# Patient Record
Sex: Female | Born: 1950 | Race: Black or African American | Hispanic: No | Marital: Married | Smoking: Former smoker
Health system: Southern US, Community
[De-identification: ages and names within clinical notes are randomized; demographics above are authoritative.]

## PROBLEM LIST (undated history)

## (undated) DIAGNOSIS — E785 Hyperlipidemia, unspecified: Secondary | ICD-10-CM

## (undated) DIAGNOSIS — G4730 Sleep apnea, unspecified: Secondary | ICD-10-CM

## (undated) DIAGNOSIS — R9431 Abnormal electrocardiogram [ECG] [EKG]: Secondary | ICD-10-CM

## (undated) DIAGNOSIS — M1990 Unspecified osteoarthritis, unspecified site: Secondary | ICD-10-CM

## (undated) DIAGNOSIS — N289 Disorder of kidney and ureter, unspecified: Secondary | ICD-10-CM

## (undated) DIAGNOSIS — I10 Essential (primary) hypertension: Secondary | ICD-10-CM

## (undated) DIAGNOSIS — R7303 Prediabetes: Secondary | ICD-10-CM

## (undated) DIAGNOSIS — E119 Type 2 diabetes mellitus without complications: Secondary | ICD-10-CM

## (undated) HISTORY — PX: ABDOMINAL HYSTERECTOMY: SHX81

## (undated) HISTORY — PX: CHOLECYSTECTOMY: SHX55

## (undated) HISTORY — PX: TUBAL LIGATION: SHX77

## (undated) HISTORY — PX: COLONOSCOPY: SHX174

## (undated) HISTORY — PX: EYE SURGERY: SHX253

## (undated) HISTORY — PX: COLON SURGERY: SHX602

## (undated) HISTORY — DX: Prediabetes: R73.03

## (undated) HISTORY — DX: Unspecified osteoarthritis, unspecified site: M19.90

## (undated) HISTORY — DX: Abnormal electrocardiogram (ECG) (EKG): R94.31

## (undated) HISTORY — DX: Essential (primary) hypertension: I10

## (undated) HISTORY — DX: Hyperlipidemia, unspecified: E78.5

## (undated) HISTORY — DX: Sleep apnea, unspecified: G47.30

---

## 2000-04-11 ENCOUNTER — Other Ambulatory Visit: Admission: RE | Admit: 2000-04-11 | Discharge: 2000-04-11 | Payer: Self-pay | Admitting: Obstetrics and Gynecology

## 2001-05-23 ENCOUNTER — Ambulatory Visit (HOSPITAL_COMMUNITY): Admission: RE | Admit: 2001-05-23 | Discharge: 2001-05-23 | Payer: Self-pay | Admitting: Family Medicine

## 2001-05-23 ENCOUNTER — Encounter: Payer: Self-pay | Admitting: Family Medicine

## 2001-10-21 ENCOUNTER — Ambulatory Visit (HOSPITAL_COMMUNITY): Admission: RE | Admit: 2001-10-21 | Discharge: 2001-10-21 | Payer: Self-pay | Admitting: Family Medicine

## 2001-10-21 ENCOUNTER — Encounter: Payer: Self-pay | Admitting: Family Medicine

## 2001-11-15 ENCOUNTER — Ambulatory Visit (HOSPITAL_COMMUNITY): Admission: RE | Admit: 2001-11-15 | Discharge: 2001-11-15 | Payer: Self-pay | Admitting: Internal Medicine

## 2002-05-26 ENCOUNTER — Inpatient Hospital Stay (HOSPITAL_COMMUNITY): Admission: EM | Admit: 2002-05-26 | Discharge: 2002-05-28 | Payer: Self-pay | Admitting: *Deleted

## 2002-05-26 ENCOUNTER — Encounter: Payer: Self-pay | Admitting: Family Medicine

## 2002-05-30 ENCOUNTER — Ambulatory Visit (HOSPITAL_COMMUNITY): Admission: RE | Admit: 2002-05-30 | Discharge: 2002-05-30 | Payer: Self-pay | Admitting: Internal Medicine

## 2002-08-26 ENCOUNTER — Encounter: Payer: Self-pay | Admitting: *Deleted

## 2002-08-26 ENCOUNTER — Emergency Department (HOSPITAL_COMMUNITY): Admission: EM | Admit: 2002-08-26 | Discharge: 2002-08-26 | Payer: Self-pay | Admitting: *Deleted

## 2003-03-13 ENCOUNTER — Ambulatory Visit (HOSPITAL_COMMUNITY): Admission: RE | Admit: 2003-03-13 | Discharge: 2003-03-13 | Payer: Self-pay | Admitting: Obstetrics & Gynecology

## 2003-03-30 ENCOUNTER — Ambulatory Visit (HOSPITAL_COMMUNITY): Admission: RE | Admit: 2003-03-30 | Discharge: 2003-03-30 | Payer: Self-pay | Admitting: Obstetrics & Gynecology

## 2003-04-29 ENCOUNTER — Ambulatory Visit (HOSPITAL_COMMUNITY): Admission: RE | Admit: 2003-04-29 | Discharge: 2003-04-29 | Payer: Self-pay | Admitting: Family Medicine

## 2003-05-08 ENCOUNTER — Ambulatory Visit: Admission: RE | Admit: 2003-05-08 | Discharge: 2003-05-08 | Payer: Self-pay | Admitting: Family Medicine

## 2004-03-10 ENCOUNTER — Ambulatory Visit (HOSPITAL_COMMUNITY): Admission: RE | Admit: 2004-03-10 | Discharge: 2004-03-10 | Payer: Self-pay | Admitting: Family Medicine

## 2004-03-16 ENCOUNTER — Encounter (HOSPITAL_COMMUNITY): Admission: RE | Admit: 2004-03-16 | Discharge: 2004-03-17 | Payer: Self-pay | Admitting: Family Medicine

## 2004-08-02 ENCOUNTER — Ambulatory Visit (HOSPITAL_COMMUNITY): Admission: RE | Admit: 2004-08-02 | Discharge: 2004-08-02 | Payer: Self-pay | Admitting: Family Medicine

## 2005-07-12 ENCOUNTER — Ambulatory Visit (HOSPITAL_COMMUNITY): Admission: RE | Admit: 2005-07-12 | Discharge: 2005-07-12 | Payer: Self-pay | Admitting: Family Medicine

## 2005-09-14 ENCOUNTER — Ambulatory Visit (HOSPITAL_COMMUNITY): Admission: RE | Admit: 2005-09-14 | Discharge: 2005-09-14 | Payer: Self-pay | Admitting: Obstetrics & Gynecology

## 2009-01-06 ENCOUNTER — Ambulatory Visit (HOSPITAL_COMMUNITY): Admission: RE | Admit: 2009-01-06 | Discharge: 2009-01-06 | Payer: Self-pay | Admitting: Family Medicine

## 2009-01-07 ENCOUNTER — Ambulatory Visit (HOSPITAL_COMMUNITY): Admission: RE | Admit: 2009-01-07 | Discharge: 2009-01-07 | Payer: Self-pay | Admitting: Family Medicine

## 2009-01-14 ENCOUNTER — Ambulatory Visit (HOSPITAL_COMMUNITY): Admission: RE | Admit: 2009-01-14 | Discharge: 2009-01-14 | Payer: Self-pay | Admitting: Family Medicine

## 2009-07-19 ENCOUNTER — Encounter: Admission: RE | Admit: 2009-07-19 | Discharge: 2009-07-19 | Payer: Self-pay | Admitting: Family Medicine

## 2009-08-12 ENCOUNTER — Ambulatory Visit (HOSPITAL_COMMUNITY): Admission: RE | Admit: 2009-08-12 | Discharge: 2009-08-12 | Payer: Self-pay | Admitting: Family Medicine

## 2009-08-12 ENCOUNTER — Encounter: Payer: Self-pay | Admitting: Orthopedic Surgery

## 2009-08-16 ENCOUNTER — Ambulatory Visit (HOSPITAL_COMMUNITY): Admission: RE | Admit: 2009-08-16 | Discharge: 2009-08-16 | Payer: Self-pay | Admitting: Family Medicine

## 2009-08-16 ENCOUNTER — Encounter: Payer: Self-pay | Admitting: Orthopedic Surgery

## 2009-09-14 ENCOUNTER — Encounter: Payer: Self-pay | Admitting: Orthopedic Surgery

## 2009-09-15 ENCOUNTER — Ambulatory Visit: Payer: Self-pay | Admitting: Orthopedic Surgery

## 2009-09-15 DIAGNOSIS — M5412 Radiculopathy, cervical region: Secondary | ICD-10-CM | POA: Insufficient documentation

## 2010-01-07 ENCOUNTER — Ambulatory Visit (HOSPITAL_COMMUNITY): Admission: RE | Admit: 2010-01-07 | Discharge: 2010-01-07 | Payer: Self-pay | Admitting: Family Medicine

## 2010-03-31 NOTE — Assessment & Plan Note (Signed)
Summary: rt shoulder pain xr/mri at [facility]   Vital Signs:  Patient profile:   60 year old female Height:      60 inches Weight:      191 pounds Pulse rate:   82 / minute Resp:     16 per minute  Vitals Entered By: Fuller Canada MD (September 15, 2009 9:58 AM)  Visit Type:  new patient Referring Provider:  Dr. Lilyan Punt Primary Provider:  Dr. Lilyan Punt  CC:  right shoulder pain.  History of Present Illness: I have a 89-year-old female who had sudden onset of extreme RIGHT shoulder pain which radiated into her hand was associated with tingling and weakness in forward elevation.  This started in June is now gone.  Her range of motion is improved and her pain is zero.  Initially Vicodin and Percocet did not help.  She did get an injection in the front of the shoulder which seemed to help some.  She did have a shoulder x-ray which showed a type III acromion and a normal glenohumeral joint.  There were some height or trophic changes of the clavicle at the a.c. joint.  Cervical MRI dated June 20 of this year show a C5-C6 small central annular tear tiny focal disc protrusion slightly more prominent than her previous study without cord impingement    Xrays rt shoulder [facility] 08/12/09.  MRI C spine 08/16/09 for review also.  Meds: Pravastatin, Amlodipine.    Allergies (verified): No Known Drug Allergies  Past History:  Past Medical History: htn cholesterol  Past Surgical History: hysterectomy gallbladder  Family History: Family History of Diabetes Family History Coronary Heart Disease female < 13 Family History of Arthritis  Social History: Patient is married.  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  Review of Systems Constitutional:  Denies weight loss, weight gain, fever, chills, and fatigue. Cardiovascular:  Denies chest pain, palpitations, fainting, and murmurs. Respiratory:  Denies short of breath, wheezing, couch,  tightness, pain on inspiration, and snoring . Gastrointestinal:  Denies heartburn, nausea, vomiting, diarrhea, constipation, and blood in your stools. Genitourinary:  Denies frequency, urgency, difficulty urinating, painful urination, flank pain, and bleeding in urine. Neurologic:  Complains of tingling; denies numbness, unsteady gait, dizziness, tremors, and seizure. Musculoskeletal:  Denies joint pain, swelling, instability, stiffness, redness, heat, and muscle pain. Endocrine:  Denies excessive thirst, exessive urination, and heat or cold intolerance. Psychiatric:  Denies nervousness, depression, anxiety, and hallucinations. Skin:  Denies changes in the skin, poor healing, rash, itching, and redness. HEENT:  Denies blurred or double vision, eye pain, redness, and watering. Immunology:  Denies seasonal allergies, sinus problems, and allergic to bee stings. Hemoatologic:  Denies easy bleeding and brusing.  Physical Exam  Additional Exam:   * VS reviewed and were normal   *GEN: appearance was normal   ** CDV: normal pulses temperature and no edema  * LYMPH nodes were normal   * SKIN was normal   * Neuro: normal sensation, normal reflexes bilaterally ** Psyche: AAO x 3 and mood was normal   MSK *Gait was normal  *Inspection cervical spine was aligned properly was nontender had normal range of motion and a negative Spurling sign  Normal range of motion the shoulder negative Hawkins sign some tenderness over the trapezius muscle, normal strength in her RIGHT upper extremity and the shoulder was stable      Impression & Recommendations:  Problem # 1:  CERVICAL RADICULITIS (ICD-723.4) Assessment New  resultant cervical radiculitis tiny disc bulge C5-C6 no cord or root impingement now improved followup as needed  Orders: New Patient Level III (30865)  Patient Instructions: 1)  Please schedule a follow-up appointment as needed.

## 2010-03-31 NOTE — Letter (Signed)
Summary: *Orthopedic Consult Note  [facility]  [address] [place] [address]  [place], [place] [address]   Phone: [phone]  Fax: [phone]    Re:    ODESSER TOURANGEAU DOB:    24-Feb-1951   Dear: Lorin Picket   Thank you for requesting that we see the above patient for consultation.  A copy of the detailed office note will be sent under separate cover, for your review.  Evaluation today is consistent with:  1)  CERVICAL RADICULITIS (ICD-723.4)  Our recommendation is for: observation at this point as the patient is asymptomatic she did have a cervical disc lesion which is basically a nonsurgical protrusion and what sounds like some radiculitis at that time she was symptomatic       Thank you for this opportunity to look after your patient.  Sincerely,   Terrance Mass. MD.

## 2010-04-01 NOTE — Letter (Signed)
Summary: History form  History form   Imported By: Jacklynn Ganong 09/17/2009 07:33:52  _____________________________________________________________________  External Attachment:    Type:   Image     Comment:   External Document

## 2010-07-15 NOTE — Procedures (Signed)
   Emily, Waters                          ACCOUNT NO.:  [id number]   MEDICAL RECORD NO.:  [id number]                  PATIENT TYPE:  PINP   LOCATION:                                       FACILITY:  [facility]   PHYSICIAN:  Donna Bernard, M.D.             DATE OF BIRTH:  05/31/1950   DATE OF PROCEDURE:  DATE OF DISCHARGE:  05/28/2002                                EKG INTERPRETATION   INTERPRETATION:  EKG reveals normal sinus rhythm with nonspecific ST-T  changes.                                               Donna Bernard, M.D.    Karie Chimera  D:  07/28/2002  T:  07/28/2002  Job:  [id number]

## 2010-07-15 NOTE — H&P (Signed)
   NAMELAINI, URICK NO.:  [id number]   MEDICAL RECORD NO.:  [id number]                   PATIENT TYPE:  EMS   LOCATION:  ED                                   FACILITY:  [facility]   PHYSICIAN:  Scott A. Gerda Diss, M.D.               DATE OF BIRTH:  [birth year]   DATE OF ADMISSION:  DATE OF DISCHARGE:                                HISTORY & PHYSICAL   CHIEF COMPLAINT:  Chest discomfort.   HISTORY OF PRESENT ILLNESS:  This 60 year old black female states that she  had been doing fairly well, but late today she has had several episodes of  left-sided chest pain.  Once it was for just a couple of minutes, another  time it lasted 15-20 minutes and another time close to 50 minutes.  She  denies any sweats, nausea or vomiting.  She denies any regurgitation or  epigastric pain.  She denies any shoulder blade pain.  She denies fevers,  chills, or cough.  She denies shortness of breath but did relate that she  had a slight DOE with one episode of walking.  She denies any substernal  chest pressure or tightness with walking.   PAST MEDICAL HISTORY:  HTN, obesity, also her gallbladder and a  hysterectomy.   FAMILY MEDICAL HISTORY:  HTN, and mom had a heart attack at age 47, dad with  arthritis.   ALLERGIES:  None.   SOCIAL HISTORY:  Married, does not smoke; has 2 children.   MEDICATIONS:  Lotrel 5/10 one daily.   REVIEW OF SYSTEMS:  Per above.   PHYSICAL EXAMINATION:  GENERAL:  NAD.  HEENT:  TMs __________  .  NECK:  No masses.  CHEST:  CTA. Chest wall nontender.  HEART:  Regular, no murmurs.  No gallops.  ABDOMEN:  Abdomen is soft, no guarding or rebound.  EXTREMITIES:  No edema.  Pulses are normal.  SKIN:  Warm and dry.  VITAL SIGNS:  Blood pressure best reading was 146/94.   LABORATORY DATA:  EKG:  No acute changes noted.    ASSESSMENT AND PLAN:  Chest pain--rule out myocardial infarction.  To get  chest x-ray, lab work, consult cardiology. Given  the patient's risk factors,  feel patient is at risk of heart disease.  Stress Cardiolite versus  catheterization may be best route.                                               Scott A. Gerda Diss, M.D.    Linus Orn  D:  05/26/2002  T:  05/26/2002  Job:  [id number]

## 2010-07-15 NOTE — Op Note (Signed)
NAMESHALEAH, NISSLEY                          ACCOUNT NO.:  [id number]   MEDICAL RECORD NO.:  [id number]                   PATIENT TYPE:  AMB   LOCATION:  DAY                                  FACILITY:  [facility]   PHYSICIAN:  R. Roetta Sessions, M.D.              DATE OF BIRTH:  1950-07-14   DATE OF PROCEDURE:  05/30/2002  DATE OF DISCHARGE:                                 OPERATIVE REPORT   PROCEDURE:  Diagnostic colonoscopy.   INDICATIONS FOR PROCEDURE:  The patient is a 60 year old lady with  intermittent hematochezia.  I saw her back in 09/2001, and it was recommended  that she have a colonoscopy; however, she has put it off until now.  She was  recently discharged from the hospital where she was in briefly with chest  pain.  She said her cardiac workup was negative.  Colonoscopy is now being  done to further evaluate hematochezia.  This approach has been discussed  with the patient previously.  The potential risks, benefits, and  alternatives have been reviewed.  Please see my dictated consultation note  and my handwritten interim updated H&P.   PROCEDURE:  O2 saturation, blood pressure, pulses, and respirations were  monitored throughout the entire procedure.  Conscious sedation was with  Versed 6 mg IV, Demerol 125 mg IV in divided doses.  The instrument used was  the Olympus video chip adult colonoscope.   FINDINGS:  Digital rectal examination revealed no abnormalities.   ENDOSCOPIC FINDINGS:  The prep was good.   Rectum:  Examination of the rectal mucosa including retroflex view of the  anal verge revealed only internal hemorrhoids.   Colon:  The colonic mucosa was surveyed from the rectosigmoid junction to  the left, transverse, right colon to the area of the appendiceal orifice,  ileocecal valve, and cecum.  These structures were well-seen and  photographed for the record.  The colonic mucosa to the cecum appeared  normal.  From the level of the cecum and ileocecal  valve, the scope was  slowly withdrawn.  All previously mentioned mucosal surfaces were again  seen.  No other abnormalities were observed.  The patient tolerated the  procedure well and was reactive in endoscopy.   IMPRESSION:  1. Internal hemorrhoids, otherwise normal rectum.  2. Normal colon.   I suspect the patient is bleeding intermittently from hemorrhoids (and  incidentally, she does not report any rectal bleeding since 09/2001).   RECOMMENDATIONS:  1. Hemorrhoid literature provided to the patient.  2.     She is to use a course of Anusol HC suppositories one per rectum at bedtime.  3. Follow up with Dr. Lilyan Punt as needed.  4. Repeat colonoscopy in 10 years.  Jonathon Bellows, M.D.    RMR/MEDQ  D:  05/30/2002  T:  05/30/2002  Job:  [id number]   cc:   Lorin Picket A. Gerda Diss, M.D.  [address], [address]  [place]  [place] [address]  Fax: [phone]

## 2010-07-15 NOTE — Consult Note (Signed)
NAMEKANDA, DELUNA NO.:  [id number]   MEDICAL RECORD NO.:  [id number]                   PATIENT TYPE:  INP   LOCATION:  A201                                 FACILITY:  [facility]   PHYSICIAN:  Vida Roller, M.D.                DATE OF BIRTH:  08-16-1950   DATE OF CONSULTATION:  05/27/2002  DATE OF DISCHARGE:                                   CONSULTATION   HISTORY OF PRESENT ILLNESS:  The patient is a 60 year old African American  female with no significant cardiac history who has hypertension, presented  with an episode of chest pain to her primary care physician that she related  as pulsating left anterior chest pain with no associated shortness of breath  or diaphoresis.  She states that she was walking up a flight of stairs when  it occurred.  She says the pain essentially lasted about an hour, was self  limiting and essentially resolved, and she has not had another episode.   PAST MEDICAL HISTORY:  Significant for hypertension, obesity.  She is status  post a hysterectomy and cholecystectomy.   MEDICATIONS:  Her medications prior to admission were Lotrel 5 and 10.  In  the hospital, she is on Norvasc, Lisinopril, aspirin and nitroglycerin as  she needs it for pain.   SOCIAL HISTORY:  [place].  She is a  Scientist, physiological at a customer service center.  She is married and has two  children.  She has a five pack year smoking history but quit 15 years ago.  She does not use alcohol.  Does not use drugs.   FAMILY HISTORY:  Mother is alive and well at age 53 but had a myocardial  infarction at age 20 and has hypertension.  Her father is alive and he has  arthritis but no significant medical problems.  She has a sister with  atherosclerotic disease in her abdominal aorta and one sister and two  brothers who are alive and well without any significant disease.   REVIEW OF SYSTEMS:  Noncontributory aside from that discussed  in the history  of present illness.   PHYSICAL EXAMINATION:  GENERAL APPEARANCE:  She is a pleasant African  American female who is a good historian.  VITAL SIGNS:  She is afebrile, pulse 79, respiratory rate 20, blood pressure  146/80.  HEENT:  Unremarkable.  NECK:  Supple with no jugular venous distention or carotid bruits.  CHEST:  Clear to auscultation with a few scattered wheezes.  CARDIOVASCULAR:  Nondisplaced point of maximum impulse with no lifts or  thrills.  First and second heart sounds are normal.  There is no third or  fourth sound and no murmur.  ABDOMEN:  Obese but soft and nontender.  GU:  Deferred.  EXTREMITIES:  Lower extremities without cyanosis, clubbing or edema.  She  has no joint or muscle abnormalities.  Pulses are 2+ and equal throughout  without bruits.  NEUROLOGIC:  Grossly intact.   LABORATORY DATA:  Chest x-ray was not performed.  Electrocardiogram showed  sinus rhythm at a rate of 81 with a mild left axis deviation.  There is  nonspecific ST-T wave changes, but no Q-waves and no acute ischemic changes.  Her Q-T interval is mildly prolonged a 483 msec, not within a dangerous  range.   White blood cell count 7.4, H&H 13 and 39, platelets 296,000.  Sodium 139,  potassium 3.6, chloride 104, bicarbonate 36, BUN and creatinine 12 and 0.8  with blood sugar of 105.  Liver function tests are normal.  Three sets of  cardiac enzymes are inconsistent with acute myocardial infarction, negative  troponins.  PT/PTT and INR are 34, 13.3 and 1.  Urinalysis is normal.   ASSESSMENT:  Chest pain which is atypical for coronary disease but  concerning because it has an exertional component.  She does have several  risk factors, and it is reasonable to evaluate her as an inpatient.  I would  recommend an exercise Cardiolite.  Her hypertension seems to be not entirely  well controlled under medications. Considerations may be to increase her  Lisinopril.  We do not know what  her lipids are.  We have recommended a  fasting lipid panel in the morning.                                               Vida Roller, M.D.    JH/MEDQ  D:  05/27/2002  T:  05/28/2002  Job:  [id number]

## 2010-07-15 NOTE — Procedures (Signed)
[name], [name]              ACCOUNT NO.:  [id number]   MEDICAL RECORD NO.:  [id number]          PATIENT TYPE:  REC   LOCATION:  RAD                           FACILITY:  [facility]   PHYSICIAN:  Scott A. Gerda Diss, MD    DATE OF BIRTH:  [birth year]   DATE OF PROCEDURE:  03/16/2004  DATE OF DISCHARGE:                                    STRESS TEST   PROCEDURE:  Stress test Cardiolite.   INDICATION:  Chest discomfort, lightheadedness, high blood pressure.   RESTING EKG:  There are no acute ST segment changes noted on resting EKG.   HEART RATE RESPONSE EXERCISE:  The patient's heart rate went up to a target  heart rate within the first 3 minutes of exercise.  The patient was unable  to go into stage 2, had to manually override it.  The patient did have some  ST segment slight depression with upsloping in lead II.  There were no ST  segment changes indicative of heart disease.  In V3 and V4, she did have  some ST segment depressions that were noted at 4 minutes and again at 6  minutes.   The recovery phase of her EKG did have some non-ST segment depression, but  they are upsloping at 0.08 past J-point and were not significant.  In  addition to this, she had some slight ST segment depression of V4 and V5.  This rapidly corrected itself.   INTERPRETATION:  Mildly abnormal stress test with poor cardiac conditioning  and await Cardiolite studies.      SAL/MEDQ  D:  03/16/2004  T:  03/16/2004  Job:  [id number]

## 2010-07-15 NOTE — Op Note (Signed)
[name], [name]                          ACCOUNT NO.:  [id number]   MEDICAL RECORD NO.:  [id number]                   PATIENT TYPE:  AMB   LOCATION:  DAY                                  FACILITY:  [facility]   PHYSICIAN:  Gerrit Friends. Rourk, M.D.               DATE OF BIRTH:  1950-04-19   DATE OF PROCEDURE:  11/15/2001  DATE OF DISCHARGE:  11/15/2001                                 OPERATIVE REPORT   PROCEDURE:  Colonoscopy with snare polypectomy biopsy.   ENDOSCOPIST:  Gerrit Friends. Rourk, M.D.   INDICATIONS FOR PROCEDURE:  The patient is a 60 year old lady with an 63-  month history of intermittent rectal bleeding.  Colonoscopy is now being  done to further evaluate these symptoms.  The approach has been discussed  with the patient at length at the bedside and previously in my office.  The  potential risks, benefits, and alternatives have been reviewed; questions  answered.  She is agreeable.  Please see my dictated H&P for more  information.   DESCRIPTION OF PROCEDURE:  O2 saturation, blood pressure, pulse and  respirations were monitored throughout the entire procedure.   CONSCIOUS SEDATION:  Versed 7 mg IV, Demerol 125 mg IV in divided doses.   INSTRUMENT:  Olympus video chip adult colonoscope.   FINDINGS:  Digital rectal exam revealed no abnormalities.   ENDOSCOPIC FINDINGS:  The prep was good.   RECTUM:  Examination of the rectal mucosa including the retroflex view of  the anal verge revealed no abnormalities.   COLON:  The colonic mucosa was surveyed from the rectosigmoid junction  through the left transverse and right colon to the area of the appendiceal  orifice, ileocecal valve, and cecal valve and cecum.  These structures were  well seen and photographed for the record.  The patient was noted to have a  3 mm diminutive polyp at 15 cm and a large complex pedunculated polypoid  mass measuring approximately 3 x 5 cm between 30 and 35 cm in the sigmoid  colon.  No other  colonic mucosal abnormalities were noted.   From the level of the cecum and ileocecal valve the scope was slowly  withdrawn.  All previously mentioned mucosal surfaces were again seen.  No  abnormalities were observed.  The complex polyp in the sigmoid colon was  removed in piece meal fashion with snare cautery used without difficulty,  although it did take some time.  The diminutive polyp, at 15 cm, was cold  biopsied/removed.  The patient tolerated the procedure well and was reacted  in endoscopy.   IMPRESSION:  1. Normal rectum.  2. Complex pedunculated polyp at 30-35 cm removed in piecemeal fashion.     Diminutive polyp 15 cm, cold biopsied/removed.  The remainder of the     colonic mucosa appeared normal.   RECOMMENDATIONS:  1. Colace 100 mg orally b.i.d. times the next 5 days.  2. No aspirin or arthritis medications for 10 days.  3. Follow up on pathology.  4. Further recommendations to follow.                                               Gerrit Friends. Rourk, M.D.    RMR/MEDQ  D:  11/15/2001  T:  11/18/2001  Job:  [id number]   cc:   Dr. Gerda Diss

## 2010-07-15 NOTE — Discharge Summary (Signed)
   [name], [name]                          ACCOUNT NO.:  [id number]   MEDICAL RECORD NO.:  [id number]                   PATIENT TYPE:  INP   LOCATION:  A201                                 FACILITY:  [facility]   PHYSICIAN:  Scott A. Gerda Diss, M.D.               DATE OF BIRTH:  1950/05/18   DATE OF ADMISSION:  05/26/2002  DATE OF DISCHARGE:  05/28/2002                                 DISCHARGE SUMMARY   DISCHARGE DIAGNOSIS:  Chest pain.   HISTORY AND PHYSICAL:  The patient was admitted in because of chest pain and  discomfort.  She has risk factors.  It was felt that it was not safe to work  this up as an outpatient, and it was felt that the patient would be best  served by a Cardiolite test done.  She was admitted into the hospital and  Cardiolite showed no ischemia, no scar.  Ejection fraction was 61%.  No  further cardiac work up was necessary.  The patient was discharged to home  on the evening of 3/31, instructed to use Aleve as needed for discomfort and  to follow up with [place] in approximately one week or sooner if any problems.                                               Scott A. Gerda Diss, M.D.    Linus Orn  D:  06/01/2002  T:  06/02/2002  Job:  [id number]

## 2010-07-15 NOTE — Procedures (Signed)
   NAMENAVI, EWTON NO.:  [id number]   MEDICAL RECORD NO.:  [id number]                   PATIENT TYPE:   LOCATION:                                       FACILITY:  [facility]   PHYSICIAN:  Vida Roller, M.D.                DATE OF BIRTH:  1950-10-16   DATE OF PROCEDURE:  05/28/2002  DATE OF DISCHARGE:  05/28/2002                                    STRESS TEST   PROCEDURE:  Exercise Cardiolite Study.   INDICATIONS:  [name] is a 60 year old female with no known coronary  disease who presents with atypical chest discomfort.  Her cardiac risk  factors include family history, hypertension, remote tobacco abuse, and  obesity.   BASELINE DATA:  EKG shows sinus rhythm at 92 beats/minute with nonspecific  ST abnormalities.  Her blood pressure was 148/80.   FINDINGS/IMPRESSION:  1. The patient exercised for a total of 5 minutes of the Bruce protocol     stage 2.  Speed was decreased for the last minute of exercise secondary     to leg stiffness.  The patient achieved 7.0 METS.  Maximum heart rate was     173 beats/minute.  Maximum blood pressure is 198/90.  Target heart rate     was achieved within the first minute of exercise.  2. Cardiolite was injected at 4 minutes. The patient complained of left knee     and hip pain and stiffness.  These symptoms resolved in recovery.  3. EKG shows no ischemia and no arrhythmia.  There was a normal blood     pressure responsive.  4. Final images and results are pending MD review.     Amy Mercy Riding, P.A. [facility]                     Vida Roller, M.D.    AB/MEDQ  D:  05/28/2002  T:  05/29/2002  Job:  [id number]

## 2010-07-15 NOTE — Consult Note (Signed)
[name], [name]                          ACCOUNT NO.:  [id number]   MEDICAL RECORD NO.:  [id number]                   PATIENT TYPE:  OUT   LOCATION:  RAD                                  FACILITY:  [facility]   PHYSICIAN:  [name], M.D.              DATE OF BIRTH:  02/10/1951   DATE OF CONSULTATION:  10/23/2001  DATE OF DISCHARGE:                           GASTROENTEROLOGY CONSULTATION   REASON FOR CONSULTATION:  Hematochezia.   HISTORY OF PRESENT ILLNESS:  The patient is a pleasant 60 year old lady sent  over to see Dr. Lilyan Waters to further evaluate an 18 month history of  intermittent rectal bleeding.  She states she can have bleeding with and  without stooling.  She has on order one bowel movement daily.  She says she  can feel when blood is coming.  Blood is sometimes mixed with the stool.  She has no change in caliber of her stool and again denies constipation and  diarrhea.  She has not really had any abdominal pain, although she had some  flank pain recently, for which she underwent a CT scan, which demonstrated  nonobstructive calculus in the upper pole of the left kidney.  There are no  other significant abnormalities.  The patient has not had any change in her  weight.  She denies any other upper GI tract symptoms, such as odynophagia,  dysphagia, or early signs of reflux symptoms, nausea, vomiting.  Her CBC  recently on Jul 17, 2001 was completely normal with H&H of 14.1 and 41.0,  MCV 87.6.  There is nothing in the history of colon or rectal neoplasia.  She has never had a lower GI tract image.  She did see Dr. Precious Waters some 18  months ago for her symptoms and she reports that he told her that if she had  a rectal tear.  No specific therapy was recommended per her report.   PAST MEDICAL HISTORY:  Significant for hypertension.   PAST SURGICAL HISTORY:  1. Hysterectomy.  2. Cholecystectomy.   CURRENT MEDICATIONS:  1. Lotrel 5/10 mg daily.  2. Vicodin  5/500 mg p.Emilyn. flank pain.  3. Lodine prescribed, but has not taken any for flank pain.   ALLERGIES:  No known drug allergies.   FAMILY HISTORY:  Mother is 86, alive with some heart problems.  Father is 59  and has rheumatoid arthritis.  No history of IBD or colorectal cancer.   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  She quit smoking 10 years ago.  She occasionally consumes  alcohol.   REVIEW OF SYSTEMS:  No chest pain, dyspnea on exertion.  No fever, chills,  no weight loss or weight gain.   PHYSICAL EXAMINATION:  GENERAL:  Today she is a pleasant, well-groomed, 60-  year-old lady.  VITAL  SIGNS:  Weight 185, height 4 feet 11 inches, temperature 97.9, blood  pressure 186/84, pulse 88.  SKIN:  Warm and dry.  HEENT:  No scleral icterus.  JVD is not prominent.  CHEST:  Lungs are clear to auscultation.  CARDIAC:  Regular rate and rhythm without murmur, gallop, or rub.  ABDOMEN:  Nondistended, positive bowel sounds, soft, nontender, without  appreciable mass or organomegaly.  EXTREMITIES:  No edema.  RECTAL:  Good sphincter tone.  No mass in rectal vault, no stool in the  rectal vault.  Mucus:  Hemoccult negative.   IMPRESSION:  The patient is a 60 year old lady with an 68-month history of  intermittent blood per rectum.  Hopefully this is secondary to benign  anorectal process; however, she needs to have her entire lower GI tract  evaluated.  To the same, I have offered the patient a colonoscopy.   I would like to thank Dr. Lilyan Waters for letting me see this nice lady  today.  Further recommendations will follow.                                                 [name], M.D.    RMR/MEDQ  D:  10/23/2001  T:  10/23/2001  Job:  [id number]   cc:   [name], M.D.

## 2011-01-30 ENCOUNTER — Other Ambulatory Visit: Payer: Self-pay | Admitting: Family Medicine

## 2011-01-30 DIAGNOSIS — Z139 Encounter for screening, unspecified: Secondary | ICD-10-CM

## 2011-02-02 ENCOUNTER — Ambulatory Visit (HOSPITAL_COMMUNITY)
Admission: RE | Admit: 2011-02-02 | Discharge: 2011-02-02 | Disposition: A | Discharge status: Home / Self Care | Payer: BC Managed Care – HMO | Source: Ambulatory Visit | Attending: Family Medicine | Admitting: Family Medicine

## 2011-02-02 DIAGNOSIS — Z139 Encounter for screening, unspecified: Secondary | ICD-10-CM

## 2011-02-02 DIAGNOSIS — Z1231 Encounter for screening mammogram for malignant neoplasm of breast: Secondary | ICD-10-CM | POA: Insufficient documentation

## 2011-04-10 ENCOUNTER — Ambulatory Visit (HOSPITAL_COMMUNITY)
Admission: RE | Admit: 2011-04-10 | Discharge: 2011-04-10 | Disposition: A | Discharge status: Home / Self Care | Payer: BC Managed Care – HMO | Source: Ambulatory Visit | Attending: Family Medicine | Admitting: Family Medicine

## 2011-04-10 ENCOUNTER — Other Ambulatory Visit: Payer: Self-pay | Admitting: Family Medicine

## 2011-04-10 DIAGNOSIS — M25569 Pain in unspecified knee: Secondary | ICD-10-CM | POA: Insufficient documentation

## 2011-04-10 DIAGNOSIS — M1710 Unilateral primary osteoarthritis, unspecified knee: Secondary | ICD-10-CM | POA: Insufficient documentation

## 2011-04-10 DIAGNOSIS — IMO0002 Reserved for concepts with insufficient information to code with codable children: Secondary | ICD-10-CM | POA: Insufficient documentation

## 2011-12-28 ENCOUNTER — Other Ambulatory Visit: Payer: Self-pay | Admitting: Family Medicine

## 2011-12-28 DIAGNOSIS — Z139 Encounter for screening, unspecified: Secondary | ICD-10-CM

## 2012-02-08 ENCOUNTER — Ambulatory Visit (HOSPITAL_COMMUNITY): Payer: BC Managed Care – HMO

## 2012-02-12 ENCOUNTER — Ambulatory Visit (HOSPITAL_COMMUNITY): Payer: BC Managed Care – HMO

## 2012-02-22 ENCOUNTER — Ambulatory Visit (HOSPITAL_COMMUNITY): Payer: BC Managed Care – HMO

## 2012-02-26 ENCOUNTER — Ambulatory Visit (HOSPITAL_COMMUNITY): Payer: BC Managed Care – HMO

## 2012-05-30 ENCOUNTER — Encounter: Payer: Self-pay | Admitting: Family Medicine

## 2012-05-30 ENCOUNTER — Ambulatory Visit (INDEPENDENT_AMBULATORY_CARE_PROVIDER_SITE_OTHER): Payer: BC Managed Care – PPO | Admitting: Family Medicine

## 2012-05-30 VITALS — BP 146/74 | HR 70 | Ht 59.0 in | Wt 211.4 lb

## 2012-05-30 DIAGNOSIS — R7309 Other abnormal glucose: Secondary | ICD-10-CM

## 2012-05-30 DIAGNOSIS — Z79899 Other long term (current) drug therapy: Secondary | ICD-10-CM

## 2012-05-30 DIAGNOSIS — I10 Essential (primary) hypertension: Secondary | ICD-10-CM | POA: Insufficient documentation

## 2012-05-30 DIAGNOSIS — R7303 Prediabetes: Secondary | ICD-10-CM | POA: Insufficient documentation

## 2012-05-30 DIAGNOSIS — R609 Edema, unspecified: Secondary | ICD-10-CM

## 2012-05-30 MED ORDER — BENAZEPRIL HCL 20 MG PO TABS: 20.0000 mg | ORAL_TABLET | Freq: Every day | ORAL | Status: DC

## 2012-05-30 MED ORDER — INDAPAMIDE 1.25 MG PO TABS: 2.5000 mg | ORAL_TABLET | ORAL | Status: DC

## 2012-05-30 NOTE — Patient Instructions (Signed)
We are increasing the dose of your diuretic. We are also changing her blood pressure medicine. Both of these were called into [facility].  Please do your lab work at your convenience somewhere over the next week.  If you're swelling does not dramatically improve over the next 10 days please let me know that. Otherwise I would like to recheck your blood pressure in 3 weeks' time along with looking at the swelling in your legs. If any questions or problems please call.

## 2012-05-30 NOTE — Progress Notes (Signed)
  Subjective:    Patient ID: [name], female    DOB: 1950/12/31, 62 y.o.   MRN: [id number]  HPI    Review of Systems     Objective:   Physical Exam        Assessment & Plan:

## 2012-05-30 NOTE — Progress Notes (Signed)
  Subjective:    Patient ID: [name], female    DOB: [birth year], 62 y.o.   MRN: [id number]  HPI This patient has found and she is having progressive swelling in the legs. She denies any chest tightness PND she denies orthopnea. Denies chest pressure. Relates no change in her medicines. Relates she does try to eat a healthy diet. She has a history hypertension mild sleep apnea hyperlipidemia prediabetes she also has family history hypertension heart disease she does not smoke  Swelling does go down when she lays down at night Review of Systems Review of systems see above     Objective:   Physical Exam Vital signs noted, throat normal neck no masses lungs are clear no crackles heart is regular pulses normal she does have edema in the lower legs.       Assessment & Plan:  Pedal edema-I believe part of this could be related to amlodipine that is in one of her medications. We will stop that and instead use benazapril 20 mg one daily in addition to thiswe will increase Lozol from 1.25 she will now use 2.5 mg every morning. I would like to recheck her in several weeks to see how the swelling is doing if it is not significantly better we may need to do an echo and additional blood work.

## 2012-06-17 ENCOUNTER — Encounter: Payer: Self-pay | Admitting: *Deleted

## 2012-06-17 ENCOUNTER — Ambulatory Visit (INDEPENDENT_AMBULATORY_CARE_PROVIDER_SITE_OTHER): Payer: BC Managed Care – PPO | Admitting: Family Medicine

## 2012-06-17 ENCOUNTER — Encounter: Payer: Self-pay | Admitting: Family Medicine

## 2012-06-17 VITALS — BP 110/68 | HR 70 | Wt 210.0 lb

## 2012-06-17 DIAGNOSIS — I10 Essential (primary) hypertension: Secondary | ICD-10-CM

## 2012-06-17 DIAGNOSIS — R0600 Dyspnea, unspecified: Secondary | ICD-10-CM

## 2012-06-17 DIAGNOSIS — R0989 Other specified symptoms and signs involving the circulatory and respiratory systems: Secondary | ICD-10-CM

## 2012-06-17 MED ORDER — FUROSEMIDE 20 MG PO TABS: 20.0000 mg | ORAL_TABLET | Freq: Two times a day (BID) | ORAL | Status: DC

## 2012-06-17 NOTE — Patient Instructions (Signed)
Stop Lozol. Use lasix 20 mg one each am in place of Lozol  We will call you with the ECHO appt

## 2012-06-18 NOTE — Progress Notes (Signed)
  Subjective:    Patient ID: [name], female    DOB: 02/21/1951, 62 y.o.   MRN: [id number]  HPI Patient presents with some swelling in the lower feet she states is doing a little bit better she denies any PND orthopnea chest pressure tightness pain nausea or vomiting. She has history hypertension recently started her on some medications.She relates compliance with taking the Lotensin as well as the lozol each morning. Past medical history hypertension pedal edema Family history noncontributory   Review of Systems See above     Objective:   Physical Exam Blood pressure was checked Overall good control. Neck no masses lungs clear no crackles heart is regular abdomen is soft extremities pedal edema is noted      Assessment & Plan:  Hypertension with pedal edema-stop Lozol. Use Lasix 20 mg one every morning. In addition to this set up for echo to rule out left ventricular dysfunction. Patient is followup within 4-6 weeks. Sooner problems. Indication for

## 2012-06-24 ENCOUNTER — Ambulatory Visit (HOSPITAL_COMMUNITY)
Admission: RE | Admit: 2012-06-24 | Discharge: 2012-06-24 | Disposition: A | Discharge status: Home / Self Care | Payer: BC Managed Care – HMO | Source: Ambulatory Visit | Attending: Family Medicine | Admitting: Family Medicine

## 2012-06-24 ENCOUNTER — Other Ambulatory Visit: Payer: Self-pay | Admitting: Family Medicine

## 2012-06-24 DIAGNOSIS — R0989 Other specified symptoms and signs involving the circulatory and respiratory systems: Secondary | ICD-10-CM | POA: Insufficient documentation

## 2012-06-24 DIAGNOSIS — R0600 Dyspnea, unspecified: Secondary | ICD-10-CM

## 2012-06-24 DIAGNOSIS — I517 Cardiomegaly: Secondary | ICD-10-CM

## 2012-06-24 DIAGNOSIS — I10 Essential (primary) hypertension: Secondary | ICD-10-CM | POA: Insufficient documentation

## 2012-06-24 DIAGNOSIS — R0609 Other forms of dyspnea: Secondary | ICD-10-CM | POA: Insufficient documentation

## 2012-06-24 DIAGNOSIS — Z139 Encounter for screening, unspecified: Secondary | ICD-10-CM

## 2012-06-24 NOTE — Progress Notes (Signed)
*  PRELIMINARY RESULTS* Echocardiogram 2D Echocardiogram has been performed.  [name] 06/24/2012, 11:49 AM

## 2012-06-27 ENCOUNTER — Ambulatory Visit (HOSPITAL_COMMUNITY)
Admission: RE | Admit: 2012-06-27 | Discharge: 2012-06-27 | Disposition: A | Discharge status: Home / Self Care | Payer: BC Managed Care – HMO | Source: Ambulatory Visit | Attending: Family Medicine | Admitting: Family Medicine

## 2012-06-27 DIAGNOSIS — Z139 Encounter for screening, unspecified: Secondary | ICD-10-CM

## 2012-06-27 DIAGNOSIS — Z1231 Encounter for screening mammogram for malignant neoplasm of breast: Secondary | ICD-10-CM | POA: Insufficient documentation

## 2012-10-30 ENCOUNTER — Other Ambulatory Visit: Payer: Self-pay | Admitting: Family Medicine

## 2013-02-10 ENCOUNTER — Encounter: Payer: Self-pay | Admitting: Family Medicine

## 2013-02-10 ENCOUNTER — Ambulatory Visit (INDEPENDENT_AMBULATORY_CARE_PROVIDER_SITE_OTHER): Payer: BC Managed Care – PPO | Admitting: Family Medicine

## 2013-02-10 VITALS — BP 128/84 | Ht 59.0 in | Wt 210.2 lb

## 2013-02-10 DIAGNOSIS — I10 Essential (primary) hypertension: Secondary | ICD-10-CM

## 2013-02-10 DIAGNOSIS — R7309 Other abnormal glucose: Secondary | ICD-10-CM

## 2013-02-10 DIAGNOSIS — R7303 Prediabetes: Secondary | ICD-10-CM

## 2013-02-10 DIAGNOSIS — Z79899 Other long term (current) drug therapy: Secondary | ICD-10-CM

## 2013-02-10 LAB — BASIC METABOLIC PANEL WITH GFR
BUN: 16 mg/dL (ref 6–23)
CO2: 26 meq/L (ref 19–32)
Calcium: 8.7 mg/dL (ref 8.4–10.5)
Chloride: 108 meq/L (ref 96–112)
Creat: 0.72 mg/dL (ref 0.50–1.10)
Glucose, Bld: 107 mg/dL — ABNORMAL HIGH (ref 70–99)
Potassium: 4.1 meq/L (ref 3.5–5.3)
Sodium: 141 meq/L (ref 135–145)

## 2013-02-10 LAB — HEPATIC FUNCTION PANEL
ALT: 14 U/L (ref 0–35)
AST: 15 U/L (ref 0–37)
Albumin: 3.8 g/dL (ref 3.5–5.2)
Alkaline Phosphatase: 95 U/L (ref 39–117)
Bilirubin, Direct: 0.1 mg/dL (ref 0.0–0.3)
Indirect Bilirubin: 0.3 mg/dL (ref 0.0–0.9)
Total Bilirubin: 0.4 mg/dL (ref 0.3–1.2)
Total Protein: 6.2 g/dL (ref 6.0–8.3)

## 2013-02-10 LAB — LIPID PANEL
Cholesterol: 151 mg/dL (ref 0–200)
HDL: 43 mg/dL (ref >39)
Total CHOL/HDL Ratio: 3.5 Ratio
Triglycerides: 53 mg/dL (ref <150)

## 2013-02-10 MED ORDER — PANTOPRAZOLE SODIUM 40 MG PO TBEC: 40.0000 mg | DELAYED_RELEASE_TABLET | Freq: Every day | ORAL | Status: DC

## 2013-02-10 MED ORDER — BENAZEPRIL-HYDROCHLOROTHIAZIDE 20-12.5 MG PO TABS: 1.0000 | ORAL_TABLET | Freq: Every day | ORAL | Status: DC

## 2013-02-10 MED ORDER — PRAVASTATIN SODIUM 40 MG PO TABS: ORAL_TABLET | ORAL | Status: DC

## 2013-02-10 NOTE — Progress Notes (Signed)
   Subjective:    Patient ID: [name], female    DOB: 1951/02/26, 62 y.o.   MRN: [id number]  HPI Patient is here today b/c she has had 3 episodes in the past 2 months to where she had difficulty swallowing certain foods. She said she ate [place], could not swallow it, and ended up vomiting the [place] up. She also had the same feeling when she ate a sandwich. Eating and felt nauseated. Comes out of the blue. Happens sporadically.Denies burning. No nocturnal Sx. Has not tried anything.    Pt also states her feet will swell every now and then. She is not taking her Lasix b/c she said it made her urinate too often.  Patient has a history hypertension prediabetes and cholesterol Family history noncontributory Patient does not smoke Review of Systems No chest pain shortness of breath nausea vomiting diarrhea some swelling in the legs    Objective:   Physical Exam  Constitutional: She is oriented to person, place, and time. She appears well-developed and well-nourished.  HENT:  Head: Normocephalic.  Right Ear: External ear normal.  Left Ear: External ear normal.  Neck: No thyromegaly present.  Cardiovascular: Normal rate, regular rhythm, normal heart sounds and intact distal pulses.   No murmur heard. Pulmonary/Chest: Effort normal and breath sounds normal. No respiratory distress. She has no wheezes.  Abdominal: Soft. Bowel sounds are normal. She exhibits no distension and no mass. There is no tenderness.  Musculoskeletal: She exhibits edema (trace edema in the ankles worse on the right and some venous stasis changes). She exhibits no tenderness.  Lymphadenopathy:    She has no cervical adenopathy.  Neurological: She is alert and oriented to person, place, and time. She exhibits normal muscle tone.  Skin: Skin is warm and dry.  Psychiatric: She has a normal mood and affect.          Assessment & Plan:  #1 HTN-would like to see slightly better control. When I checked it was  140/88 I would recommend changing it to Lotensin HCTZ followup within 6 months this should help some of the pedal edema as well  #2 hyperlipidemia continue current medication check lab work in the next visit  #3 possible intermittent reflux there is no consistent dysphagia so I doubt that there is a developing web try protonic. If ongoing troubles referral to GI for EGD and dilation warning signs discussed  #4 prediabetes good control watch diet check lab work await results  Patient encourage exercise more watch diet try to bring weight down

## 2013-02-11 ENCOUNTER — Encounter: Payer: Self-pay | Admitting: Family Medicine

## 2013-02-19 ENCOUNTER — Other Ambulatory Visit: Payer: Self-pay | Admitting: Family Medicine

## 2013-06-05 ENCOUNTER — Other Ambulatory Visit: Payer: Self-pay | Admitting: Family Medicine

## 2013-06-05 DIAGNOSIS — Z1231 Encounter for screening mammogram for malignant neoplasm of breast: Secondary | ICD-10-CM

## 2013-06-30 ENCOUNTER — Ambulatory Visit (HOSPITAL_COMMUNITY)
Admission: RE | Admit: 2013-06-30 | Discharge: 2013-06-30 | Disposition: A | Discharge status: Home / Self Care | Payer: BC Managed Care – HMO | Source: Ambulatory Visit | Attending: Family Medicine | Admitting: Family Medicine

## 2013-06-30 DIAGNOSIS — Z1231 Encounter for screening mammogram for malignant neoplasm of breast: Secondary | ICD-10-CM | POA: Insufficient documentation

## 2013-08-11 ENCOUNTER — Ambulatory Visit (INDEPENDENT_AMBULATORY_CARE_PROVIDER_SITE_OTHER): Payer: BC Managed Care – PPO | Admitting: Family Medicine

## 2013-08-11 ENCOUNTER — Encounter: Payer: Self-pay | Admitting: Family Medicine

## 2013-08-11 VITALS — BP 122/84 | Ht 59.0 in | Wt 214.2 lb

## 2013-08-11 DIAGNOSIS — I10 Essential (primary) hypertension: Secondary | ICD-10-CM

## 2013-08-11 DIAGNOSIS — M545 Low back pain, unspecified: Secondary | ICD-10-CM | POA: Insufficient documentation

## 2013-08-11 DIAGNOSIS — R7309 Other abnormal glucose: Secondary | ICD-10-CM

## 2013-08-11 DIAGNOSIS — R7303 Prediabetes: Secondary | ICD-10-CM

## 2013-08-11 DIAGNOSIS — R609 Edema, unspecified: Secondary | ICD-10-CM

## 2013-08-11 DIAGNOSIS — R600 Localized edema: Secondary | ICD-10-CM | POA: Insufficient documentation

## 2013-08-11 DIAGNOSIS — E785 Hyperlipidemia, unspecified: Secondary | ICD-10-CM

## 2013-08-11 LAB — POCT GLYCOSYLATED HEMOGLOBIN (HGB A1C): HEMOGLOBIN A1C: 5.9

## 2013-08-11 MED ORDER — PRAVASTATIN SODIUM 40 MG PO TABS: ORAL_TABLET | ORAL | Status: DC

## 2013-08-11 MED ORDER — PANTOPRAZOLE SODIUM 40 MG PO TBEC: 40.0000 mg | DELAYED_RELEASE_TABLET | Freq: Every day | ORAL | Status: DC

## 2013-08-11 MED ORDER — BENAZEPRIL-HYDROCHLOROTHIAZIDE 20-25 MG PO TABS: 1.0000 | ORAL_TABLET | Freq: Every day | ORAL | Status: DC

## 2013-08-11 NOTE — Patient Instructions (Signed)
  Do 25 to 30 minutes of walking 4 to 5 times a week    DASH Diet The DASH diet stands for "Dietary Approaches to Stop Hypertension." It is a healthy eating plan that has been shown to reduce high blood pressure (hypertension) in as little as 14 days, while also possibly providing other significant health benefits. These other health benefits include reducing the risk of breast cancer after menopause and reducing the risk of type 2 diabetes, heart disease, colon cancer, and stroke. Health benefits also include weight loss and slowing kidney failure in patients with chronic kidney disease.  DIET GUIDELINES  Limit salt (sodium). Your diet should contain less than 1500 mg of sodium daily.  Limit refined or processed carbohydrates. Your diet should include mostly whole grains. Desserts and added sugars should be used sparingly.  Include small amounts of heart-healthy fats. These types of fats include nuts, oils, and tub margarine. Limit saturated and trans fats. These fats have been shown to be harmful in the body. CHOOSING FOODS  The following food groups are based on a 2000 calorie diet. See your Registered Dietitian for individual calorie needs. Grains and Grain Products (6 to 8 servings daily)  Eat More Often: Whole-wheat bread, brown rice, whole-grain or wheat pasta, quinoa, popcorn without added fat or salt (air popped).  Eat Less Often: White bread, white pasta, white rice, cornbread. Vegetables (4 to 5 servings daily)  Eat More Often: Fresh, frozen, and canned vegetables. Vegetables may be raw, steamed, roasted, or grilled with a minimal amount of fat.  Eat Less Often/Avoid: Creamed or fried vegetables. Vegetables in a cheese sauce. Fruit (4 to 5 servings daily)  Eat More Often: All fresh, canned (in natural juice), or frozen fruits. Dried fruits without added sugar. One hundred percent fruit juice ( cup [237 mL] daily).  Eat Less Often: Dried fruits with added sugar. Canned fruit  in light or heavy syrup. [employer], Fish, and Poultry (2 servings or less daily. One serving is 3 to 4 oz [85-114 g]).  Eat More Often: Ninety percent or leaner ground beef, tenderloin, sirloin. Round cuts of beef, chicken breast, [place] breast. All fish. Grill, bake, or broil your meat. Nothing should be fried.  Eat Less Often/Avoid: Fatty cuts of meat, [place], or chicken leg, thigh, or wing. Fried cuts of meat or fish. Dairy (2 to 3 servings)  Eat More Often: Low-fat or fat-free milk, low-fat plain or light yogurt, reduced-fat or part-skim cheese.  Eat Less Often/Avoid: Milk (whole, 2%).Whole milk yogurt. Full-fat cheeses. Nuts, Seeds, and Legumes (4 to 5 servings per week)  Eat More Often: All without added salt.  Eat Less Often/Avoid: Salted nuts and seeds, canned beans with added salt. Fats and Sweets (limited)  Eat More Often: Vegetable oils, tub margarines without trans fats, sugar-free gelatin. Mayonnaise and salad dressings.  Eat Less Often/Avoid: Coconut oils, palm oils, butter, stick margarine, cream, half and half, cookies, candy, pie. FOR MORE INFORMATION The Dash Diet Eating Plan: www.dashdiet.org Document Released: 02/02/2011 Document Revised: 05/08/2011 Document Reviewed: 02/02/2011 [facility] Patient Information 2014 [place], [place].

## 2013-08-11 NOTE — Progress Notes (Signed)
   Subjective:    Patient ID: [name], female    DOB: January 01, 1951, 63 y.o.   MRN: [id number]  Hypertension This is a chronic problem. The current episode started more than 1 year ago. Associated symptoms include peripheral edema. Pertinent negatives include no chest pain. Agents associated with hypertension include NSAIDs. Risk factors for coronary artery disease include dyslipidemia and post-menopausal state. Treatments tried: lotensin. There are no compliance problems.    She relates he is trying to heart healthy diet. She has a history of hyperlipidemia and prediabetes she relates compliance with her meds She also relates how her back gives her intermittent trouble. Sometimes stiffness sometimes soreness denies any radiation this been going on for years. She does relate how she tries to minimize starches in the diet.  She does not have reflux on a regular basis only uses a medicine when necessary Review of Systems  Constitutional: Negative for activity change, appetite change and fatigue.  HENT: Negative for congestion.   Respiratory: Negative for cough and choking.   Cardiovascular: Positive for leg swelling. Negative for chest pain.  Gastrointestinal: Negative for abdominal pain.  Endocrine: Negative for polydipsia and polyphagia.  Genitourinary: Negative for frequency.  Skin: Negative for color change.  Neurological: Positive for dizziness. Negative for weakness.  Psychiatric/Behavioral: Negative for confusion.   Patient relates that she has occasional dizziness when she moves her head in bed or raises her head.    Objective:   Physical Exam  Vitals reviewed. Constitutional: She appears well-nourished. No distress.  Cardiovascular: Normal rate, regular rhythm and normal heart sounds.   No murmur heard. Pulmonary/Chest: Effort normal and breath sounds normal. No respiratory distress.  Musculoskeletal: She exhibits edema.  She has pedal edema. She states he gets worse as  the day goes on.  Lymphadenopathy:    She has no cervical adenopathy.  Neurological: She is alert. She exhibits normal muscle tone.  Psychiatric: Her behavior is normal.          Assessment & Plan:  HTN decent control we are going to increase the dose of her medicine to try to get systolic looking better. This also should help her pedal edema  Pedal edema increased dose of diuretic component of the blood pressure medicine should help. I do recommend rechecking the patient sooner if any problems otherwise 6 months  Prediabetes A1c actually better at 5.9. Encourage heart healthy diet exercise.   Intermittent reflux-does not use a medicine on a regular basis. She only uses it when she needs to  Patient up-to-date on mammogram. I have advised patient to get her colonoscopy completed.  Lumbar pain has been going on for years does not radiate down the legs exercises were shown OTC measures when necessary

## 2013-09-01 ENCOUNTER — Other Ambulatory Visit: Payer: Self-pay | Admitting: Family Medicine

## 2013-12-04 ENCOUNTER — Telehealth: Payer: Self-pay | Admitting: Family Medicine

## 2013-12-04 NOTE — Telephone Encounter (Signed)
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  She did have some allergies at one point but not around the time of the eye  Turning red.   This happened on 12/01/13, noticed when brushing teeth. No pain or discomfort Pt denies any eye infection.   [facility]

## 2013-12-04 NOTE — Telephone Encounter (Signed)
In end of this self it is unlikely that this is a sign of any serious problem. Nurses, please discuss with the patient to make sure there is no other problems going on such as bad headaches elevated blood pressure or unexplained bruising. Please document accordingly. If she is having unexplained bruising along with this she should consider making visit with [place] in the near future and we will do lab work. She should also check her blood pressure if it is above 90 diastolic or 753 systolic she should followup with [place]. Also she is due for a followup in November or December.

## 2013-12-05 NOTE — Telephone Encounter (Signed)
Left message to return call 

## 2013-12-05 NOTE — Telephone Encounter (Signed)
Discussed with patient. Patient reports no headaches or unexplained bruising. Patient stated she will check her blood pressure and call back if elevated if not follow up in November as scheduled.

## 2014-05-25 ENCOUNTER — Encounter: Payer: Self-pay | Admitting: Family Medicine

## 2014-05-25 ENCOUNTER — Ambulatory Visit (INDEPENDENT_AMBULATORY_CARE_PROVIDER_SITE_OTHER): Payer: BLUE CROSS/BLUE SHIELD | Admitting: Family Medicine

## 2014-05-25 VITALS — BP 138/78 | Ht 59.0 in | Wt 209.0 lb

## 2014-05-25 DIAGNOSIS — E785 Hyperlipidemia, unspecified: Secondary | ICD-10-CM | POA: Diagnosis not present

## 2014-05-25 DIAGNOSIS — I10 Essential (primary) hypertension: Secondary | ICD-10-CM

## 2014-05-25 DIAGNOSIS — R7309 Other abnormal glucose: Secondary | ICD-10-CM

## 2014-05-25 DIAGNOSIS — R7303 Prediabetes: Secondary | ICD-10-CM

## 2014-05-25 MED ORDER — BENAZEPRIL-HYDROCHLOROTHIAZIDE 20-25 MG PO TABS: 1.0000 | ORAL_TABLET | Freq: Every day | ORAL | Status: DC

## 2014-05-25 MED ORDER — PRAVASTATIN SODIUM 40 MG PO TABS: ORAL_TABLET | ORAL | Status: DC

## 2014-05-25 NOTE — Patient Instructions (Signed)
Dear Patient,  It has been recommended to you that you have a colonoscopy. It is your responsibility to carry through with this recommendation.   Did you realize that colon cancer is the second leading cancer killer in the [place]. One in every 20 adults will get colon cancer. If all adults would go through the recommended screening for colon cancer (getting a colonoscopy), then there would be a 60% reduction in the number of people dying from colon cancer.  Colon cancer just doesn't come out of the blue. It starts off as a small polyp which over time grows into a cancer. A colonoscopy can prevent cancer and in many cases detected when it is at a very treatable phase. Small colon cancers can have cure rates of 95%. Advanced colon cancer, which often occurs in people who do not do their screenings, have cure rates less than 20%. The risk of colon cancer advances with age. Most adults should have regular colonoscopies every 10 years starting at age 50. This recommendation can vary depending on a person's medical history.  Health-care laws now allow for you to call the gastroenterologist office directly in order to set yourself up for this very important tests. Today we have recommended to you that you do this test. This test may save your life. Failure to do this test puts you at risk for premature death from colon cancer. Do the right thing and schedule this test now.  Here as a list of specialists we recommend in the surrounding area. When you call their office let them know that you are a patient of our practice in your interested in doing a screening colonoscopy. They should assist you without problems. You will need the following information when you called them: 1-name of which Dr. you see, 2-your insurance information, 3-a list of medications that you currently take, 4-any allergies you have to medications.  [place] gastroenterologist Dr. Mike Rourk, Dr Sandi Fields   [place]  gastroenterologist   [phone]  Dr.Najeeb Rehman [facility]   [phone]  [place] [facility] (Dr. Perry, N, Stark, Brodie, Gesner, Jacobs and Pyrtle) [phone]  [facility] (Dr. Buscemi, Edwards, Hayes, [place]law,Schooler) [phone]  Each group of specialists has assured us that when you called them they will help you get your colonoscopy set up. Should you have problems please let us know. Be sure to call soon. Sincerely, Carolyn Hoskins, Dr Steve Elliett [place], Dr.Akshar Starnes    

## 2014-05-25 NOTE — Progress Notes (Signed)
   Subjective:    Patient ID: [name], female    DOB: Jun 26, 1950, 64 y.o.   MRN: [id number]  Hyperlipidemia This is a chronic problem. The current episode started more than 1 year ago. Pertinent negatives include no chest pain. Treatments tried: pravastatin. Compliance problems include adherence to exercise and adherence to diet.    No concerns today.  Taking meds watching doiet a little bit not walking No chest pain or DOE  Review of Systems  Constitutional: Negative for activity change, appetite change and fatigue.  HENT: Negative for congestion.   Respiratory: Negative for cough.   Cardiovascular: Negative for chest pain.  Gastrointestinal: Negative for abdominal pain.  Endocrine: Negative for polydipsia and polyphagia.  Neurological: Negative for weakness.  Psychiatric/Behavioral: Negative for confusion.       Objective:   Physical Exam  Constitutional: She appears well-nourished. No distress.  Cardiovascular: Normal rate, regular rhythm and normal heart sounds.   No murmur heard. Pulmonary/Chest: Effort normal and breath sounds normal. No respiratory distress.  Musculoskeletal: She exhibits no edema.  Lymphadenopathy:    She has no cervical adenopathy.  Neurological: She is alert. She exhibits normal muscle tone.  Psychiatric: Her behavior is normal.  Vitals reviewed.         Assessment & Plan:  1. Essential hypertension, benign Blood pressure OK control,watch diet,walk more - Hepatic function panel - Basic metabolic panel  2. Prediabetes Watch diet try to lose weight - Hemoglobin A1c - Hepatic function panel - Basic metabolic panel  3. Hyperlipidemia Continue meds watch diet check labs - Lipid panel - Hepatic function panel - Basic metabolic panel  Colonoscopy recommended

## 2014-05-28 ENCOUNTER — Other Ambulatory Visit: Payer: Self-pay | Admitting: *Deleted

## 2014-05-28 DIAGNOSIS — R7303 Prediabetes: Secondary | ICD-10-CM

## 2014-05-28 DIAGNOSIS — Z79899 Other long term (current) drug therapy: Secondary | ICD-10-CM

## 2014-05-28 DIAGNOSIS — I10 Essential (primary) hypertension: Secondary | ICD-10-CM

## 2014-05-28 DIAGNOSIS — E785 Hyperlipidemia, unspecified: Secondary | ICD-10-CM

## 2014-05-28 LAB — HEMOGLOBIN A1C
Hgb A1c MFr Bld: 6.6 % — ABNORMAL HIGH (ref <5.7)
Mean Plasma Glucose: 143 mg/dL — ABNORMAL HIGH (ref <117)

## 2014-05-28 LAB — LIPID PANEL
CHOLESTEROL: 229 mg/dL — AB (ref 0–200)
HDL: 41 mg/dL — AB (ref >=46)
LDL Cholesterol: 172 mg/dL — ABNORMAL HIGH (ref 0–99)
TRIGLYCERIDES: 81 mg/dL (ref <150)
Total CHOL/HDL Ratio: 5.6 Ratio
VLDL: 16 mg/dL (ref 0–40)

## 2014-05-28 LAB — HEPATIC FUNCTION PANEL
ALBUMIN: 3.8 g/dL (ref 3.5–5.2)
ALT: 18 U/L (ref 0–35)
AST: 20 U/L (ref 0–37)
Alkaline Phosphatase: 86 U/L (ref 39–117)
BILIRUBIN TOTAL: 0.5 mg/dL (ref 0.2–1.2)
Bilirubin, Direct: 0.1 mg/dL (ref 0.0–0.3)
Indirect Bilirubin: 0.4 mg/dL (ref 0.2–1.2)
Total Protein: 6.8 g/dL (ref 6.0–8.3)

## 2014-05-28 LAB — BASIC METABOLIC PANEL
BUN: 13 mg/dL (ref 6–23)
CALCIUM: 8.8 mg/dL (ref 8.4–10.5)
CO2: 22 meq/L (ref 19–32)
Chloride: 106 mEq/L (ref 96–112)
Creat: 0.59 mg/dL (ref 0.50–1.10)
Glucose, Bld: 98 mg/dL (ref 70–99)
Potassium: 4 mEq/L (ref 3.5–5.3)
Sodium: 141 mEq/L (ref 135–145)

## 2014-06-22 ENCOUNTER — Ambulatory Visit (INDEPENDENT_AMBULATORY_CARE_PROVIDER_SITE_OTHER): Payer: BLUE CROSS/BLUE SHIELD | Admitting: Family Medicine

## 2014-06-22 ENCOUNTER — Encounter: Payer: Self-pay | Admitting: Family Medicine

## 2014-06-22 VITALS — BP 110/70 | Ht 59.0 in | Wt 210.0 lb

## 2014-06-22 DIAGNOSIS — R7309 Other abnormal glucose: Secondary | ICD-10-CM

## 2014-06-22 DIAGNOSIS — R7303 Prediabetes: Secondary | ICD-10-CM

## 2014-06-22 DIAGNOSIS — E785 Hyperlipidemia, unspecified: Secondary | ICD-10-CM

## 2014-06-22 MED ORDER — PRAVASTATIN SODIUM 80 MG PO TABS: ORAL_TABLET | ORAL | Status: DC

## 2014-06-22 NOTE — Progress Notes (Signed)
   Subjective:    Patient ID: [name], female    DOB: [birth year], 64 y.o.   MRN: [id number]  HPI Patient is here today to discuss her recent lab work results. Patient states that she has no new concerns at this time.  She denies any chest tightness pressure range she does relate that she does not eat healthy and fixes food a lot starch and  Review of Systems  Constitutional: Negative for activity change, appetite change and fatigue.  HENT: Negative for congestion.   Respiratory: Negative for cough.   Cardiovascular: Negative for chest pain.  Gastrointestinal: Negative for abdominal pain.  Endocrine: Negative for polydipsia and polyphagia.  Neurological: Negative for weakness.  Psychiatric/Behavioral: Negative for confusion.       Objective:   Physical Exam  Constitutional: She appears well-nourished. No distress.  Cardiovascular: Normal rate, regular rhythm and normal heart sounds.   No murmur heard. Pulmonary/Chest: Effort normal and breath sounds normal. No respiratory distress.  Musculoskeletal: She exhibits no edema.  Lymphadenopathy:    She has no cervical adenopathy.  Neurological: She is alert. She exhibits normal muscle tone.  Psychiatric: Her behavior is normal.  Vitals reviewed.    Education given today regarding hyperlipidemia prediabetes early diabetes dietary measures importance of exercise.     Assessment & Plan:  15 minutes spent in discussion today Hyperlipidemia increase pravastatin 80 mg. Patient start doing a better job with eating heart healthy and fixing food heart healthy She is to increase walking try to watch portions try to bring weight down Lab work including A1c and lipids before next visit months Hold off on diabetes medicine but A1c does not come down will need to initiate medicines

## 2014-06-22 NOTE — Patient Instructions (Addendum)
Diabetes Mellitus and Food It is important for you to manage your blood sugar (glucose) level. Your blood glucose level can be greatly affected by what you eat. Eating healthier foods in the appropriate amounts throughout the day at about the same time each day will help you control your blood glucose level. It can also help slow or prevent worsening of your diabetes mellitus. Healthy eating may even help you improve the level of your blood pressure and reach or maintain a healthy weight.  HOW CAN FOOD AFFECT ME? Carbohydrates Carbohydrates affect your blood glucose level more than any other type of food. Your dietitian will help you determine how many carbohydrates to eat at each meal and teach you how to count carbohydrates. Counting carbohydrates is important to keep your blood glucose at a healthy level, especially if you are using insulin or taking certain medicines for diabetes mellitus. Alcohol Alcohol can cause sudden decreases in blood glucose (hypoglycemia), especially if you use insulin or take certain medicines for diabetes mellitus. Hypoglycemia can be a life-threatening condition. Symptoms of hypoglycemia (sleepiness, dizziness, and disorientation) are similar to symptoms of having too much alcohol.  If your health care provider has given you approval to drink alcohol, do so in moderation and use the following guidelines:  Women should not have more than one drink per day, and men should not have more than two drinks per day. One drink is equal to:  12 oz of beer.  5 oz of wine.  1 oz of hard liquor.  Do not drink on an empty stomach.  Keep yourself hydrated. Have water, diet soda, or unsweetened iced tea.  Regular soda, juice, and other mixers might contain a lot of carbohydrates and should be counted. WHAT FOODS ARE NOT RECOMMENDED? As you make food choices, it is important to remember that all foods are not the same. Some foods have fewer nutrients per serving than other  foods, even though they might have the same number of calories or carbohydrates. It is difficult to get your body what it needs when you eat foods with fewer nutrients. Examples of foods that you should avoid that are high in calories and carbohydrates but low in nutrients include:  Trans fats (most processed foods list trans fats on the Nutrition Facts label).  Regular soda.  Juice.  Candy.  Sweets, such as cake, pie, doughnuts, and cookies.  Fried foods. WHAT FOODS CAN I EAT? Have nutrient-rich foods, which will nourish your body and keep you healthy. The food you should eat also will depend on several factors, including:  The calories you need.  The medicines you take.  Your weight.  Your blood glucose level.  Your blood pressure level.  Your cholesterol level. You also should eat a variety of foods, including:  Protein, such as meat, poultry, fish, tofu, nuts, and seeds (lean animal proteins are best).  Fruits.  Vegetables.  Dairy products, such as milk, cheese, and yogurt (low fat is best).  Breads, grains, pasta, cereal, rice, and beans.  Fats such as olive oil, trans fat-free margarine, canola oil, avocado, and olives. DOES EVERYONE WITH DIABETES MELLITUS HAVE THE SAME MEAL PLAN? Because every person with diabetes mellitus is different, there is not one meal plan that works for everyone. It is very important that you meet with a dietitian who will help you create a meal plan that is just right for you. Document Released: 11/10/2004 Document Revised: 02/18/2013 Document Reviewed: 01/10/2013 [employer] Patient Information 2015 [employer]. This   information is not intended to replace advice given to you by your health care provider. Make sure you discuss any questions you have with your health care provider. Cholesterol Cholesterol is a white, waxy, fat-like substance needed by your body in small amounts. The liver makes all the cholesterol you need. Cholesterol is  carried from the liver by the blood through the blood vessels. Deposits of cholesterol (plaque) may build up on blood vessel walls. These make the arteries narrower and stiffer. Cholesterol plaques increase the risk for heart attack and stroke.  You cannot feel your cholesterol level even if it is very high. The only way to know it is high is with a blood test. Once you know your cholesterol levels, you should keep a record of the test results. Work with your health care provider to keep your levels in the desired range.  WHAT DO THE RESULTS MEAN?  Total cholesterol is a rough measure of all the cholesterol in your blood.   LDL is the so-called bad cholesterol. This is the type that deposits cholesterol in the walls of the arteries. You want this level to be low.   HDL is the good cholesterol because it cleans the arteries and carries the LDL away. You want this level to be high.  Triglycerides are fat that the body can either burn for energy or store. High levels are closely linked to heart disease.  WHAT ARE THE DESIRED LEVELS OF CHOLESTEROL?  Total cholesterol below 200.   LDL below 100 for people at risk, below 70 for those at very high risk.   HDL above 50 is good, above 60 is best.   Triglycerides below 150.  HOW CAN I LOWER MY CHOLESTEROL?  Diet. Follow your diet programs as directed by your health care provider.   Choose fish or white meat chicken and [place], roasted or baked. Limit fatty cuts of red meat, fried foods, and processed meats, such as sausage and lunch meats.   Eat lots of fresh fruits and vegetables.  Choose whole grains, beans, pasta, potatoes, and cereals.   Use only small amounts of olive, corn, or canola oils.   Avoid butter, mayonnaise, shortening, or palm kernel oils.  Avoid foods with trans fats.   Drink skim or nonfat milk and eat low-fat or nonfat yogurt and cheeses. Avoid whole milk, cream, ice cream, egg yolks, and full-fat cheeses.    Healthy desserts include angel food cake, ginger snaps, animal crackers, hard candy, popsicles, and low-fat or nonfat frozen yogurt. Avoid pastries, cakes, pies, and cookies.   Exercise. Follow your exercise programs as directed by your health care provider.   A regular program helps decrease LDL and raise HDL.   A regular program helps with weight control.   Do things that increase your activity level like gardening, walking, or taking the stairs. Ask your health care provider about how you can be more active in your daily life.   Medicine. Take medicine only as directed by your health care provider.   Medicine may be prescribed by your health care provider to help lower cholesterol and decrease the risk for heart disease.   If you have several risk factors, you may need medicine even if your levels are normal. Document Released: 11/08/2000 Document Revised: 06/30/2013 Document Reviewed: 11/27/2012 [facility] Patient Information 2015 [place], [place]. This information is not intended to replace advice given to you by your health care provider. Make sure you discuss any questions you have with your health care  provider. Insomnia Insomnia is frequent trouble falling and/or staying asleep. Insomnia can be a long term problem or a short term problem. Both are common. Insomnia can be a short term problem when the wakefulness is related to a certain stress or worry. Long term insomnia is often related to ongoing stress during waking hours and/or poor sleeping habits. Overtime, sleep deprivation itself can make the problem worse. Every little thing feels more severe because you are overtired and your ability to cope is decreased. CAUSES   Stress, anxiety, and depression.  Poor sleeping habits.  Distractions such as TV in the bedroom.  Naps close to bedtime.  Engaging in emotionally charged conversations before bed.  Technical reading before sleep.  Alcohol and other sedatives.  They may make the problem worse. They can hurt normal sleep patterns and normal dream activity.  Stimulants such as caffeine for several hours prior to bedtime.  Pain syndromes and shortness of breath can cause insomnia.  Exercise late at night.  Changing time zones may cause sleeping problems (jet lag). It is sometimes helpful to have someone observe your sleeping patterns. They should look for periods of not breathing during the night (sleep apnea). They should also look to see how long those periods last. If you live alone or observers are uncertain, you can also be observed at a sleep clinic where your sleep patterns will be professionally monitored. Sleep apnea requires a checkup and treatment. Give your caregivers your medical history. Give your caregivers observations your family has made about your sleep.  SYMPTOMS   Not feeling rested in the morning.  Anxiety and restlessness at bedtime.  Difficulty falling and staying asleep. TREATMENT   Your caregiver may prescribe treatment for an underlying medical disorders. Your caregiver can give advice or help if you are using alcohol or other drugs for self-medication. Treatment of underlying problems will usually eliminate insomnia problems.  Medications can be prescribed for short time use. They are generally not recommended for lengthy use.  Over-the-counter sleep medicines are not recommended for lengthy use. They can be habit forming.  You can promote easier sleeping by making lifestyle changes such as:  Using relaxation techniques that help with breathing and reduce muscle tension.  Exercising earlier in the day.  Changing your diet and the time of your last meal. No night time snacks.  Establish a regular time to go to bed.  Counseling can help with stressful problems and worry.  Soothing music and white noise may be helpful if there are background noises you cannot remove.  Stop tedious detailed work at least one hour  before bedtime. HOME CARE INSTRUCTIONS   Keep a diary. Inform your caregiver about your progress. This includes any medication side effects. See your caregiver regularly. Take note of:  Times when you are asleep.  Times when you are awake during the night.  The quality of your sleep.  How you feel the next day. This information will help your caregiver care for you.  Get out of bed if you are still awake after 15 minutes. Read or do some quiet activity. Keep the lights down. Wait until you feel sleepy and go back to bed.  Keep regular sleeping and waking hours. Avoid naps.  Exercise regularly.  Avoid distractions at bedtime. Distractions include watching television or engaging in any intense or detailed activity like attempting to balance the household checkbook.  Develop a bedtime ritual. Keep a familiar routine of bathing, brushing your teeth, climbing into bed at  the same time each night, listening to soothing music. Routines increase the success of falling to sleep faster.  Use relaxation techniques. This can be using breathing and muscle tension release routines. It can also include visualizing peaceful scenes. You can also help control troubling or intruding thoughts by keeping your mind occupied with boring or repetitive thoughts like the old concept of counting sheep. You can make it more creative like imagining planting one beautiful flower after another in your backyard garden.  During your day, work to eliminate stress. When this is not possible use some of the previous suggestions to help reduce the anxiety that accompanies stressful situations. MAKE SURE YOU:   Understand these instructions.  Will watch your condition.  Will get help right away if you are not doing well or get worse. Document Released: 02/11/2000 Document Revised: 05/08/2011 Document Reviewed: 03/13/2007 [facility] Patient Information 2015 [place], [place]. This information is not intended to replace  advice given to you by your health care provider. Make sure you discuss any questions you have with your health care provider.

## 2014-07-31 ENCOUNTER — Other Ambulatory Visit: Payer: Self-pay | Admitting: Family Medicine

## 2014-07-31 DIAGNOSIS — Z1231 Encounter for screening mammogram for malignant neoplasm of breast: Secondary | ICD-10-CM

## 2014-09-02 ENCOUNTER — Ambulatory Visit (HOSPITAL_COMMUNITY)
Admission: RE | Admit: 2014-09-02 | Discharge: 2014-09-02 | Disposition: A | Discharge status: Home / Self Care | Payer: Medicare Other | Source: Ambulatory Visit | Attending: Family Medicine | Admitting: Family Medicine

## 2014-09-02 DIAGNOSIS — Z1231 Encounter for screening mammogram for malignant neoplasm of breast: Secondary | ICD-10-CM | POA: Insufficient documentation

## 2014-09-08 ENCOUNTER — Encounter: Payer: Self-pay | Admitting: Family Medicine

## 2014-09-25 DIAGNOSIS — R7309 Other abnormal glucose: Secondary | ICD-10-CM | POA: Diagnosis not present

## 2014-09-25 DIAGNOSIS — E785 Hyperlipidemia, unspecified: Secondary | ICD-10-CM | POA: Diagnosis not present

## 2014-09-26 LAB — LIPID PANEL
CHOL/HDL RATIO: 3.1 ratio (ref 0.0–4.4)
CHOLESTEROL TOTAL: 154 mg/dL (ref 100–199)
HDL: 50 mg/dL (ref >39)
LDL Calculated: 86 mg/dL (ref 0–99)
Triglycerides: 88 mg/dL (ref 0–149)
VLDL Cholesterol Cal: 18 mg/dL (ref 5–40)

## 2014-09-26 LAB — HEPATIC FUNCTION PANEL
ALBUMIN: 3.9 g/dL (ref 3.6–4.8)
ALT: 20 IU/L (ref 0–32)
AST: 18 IU/L (ref 0–40)
Alkaline Phosphatase: 94 IU/L (ref 39–117)
Bilirubin Total: 0.5 mg/dL (ref 0.0–1.2)
Bilirubin, Direct: 0.17 mg/dL (ref 0.00–0.40)
Total Protein: 6.9 g/dL (ref 6.0–8.5)

## 2014-09-26 LAB — HEMOGLOBIN A1C
ESTIMATED AVERAGE GLUCOSE: 137 mg/dL
Hgb A1c MFr Bld: 6.4 % — ABNORMAL HIGH (ref 4.8–5.6)

## 2014-10-05 ENCOUNTER — Ambulatory Visit (INDEPENDENT_AMBULATORY_CARE_PROVIDER_SITE_OTHER): Payer: Medicare Other | Admitting: Family Medicine

## 2014-10-05 ENCOUNTER — Encounter: Payer: Self-pay | Admitting: Family Medicine

## 2014-10-05 VITALS — BP 150/88 | Ht 59.0 in | Wt 212.8 lb

## 2014-10-05 DIAGNOSIS — E785 Hyperlipidemia, unspecified: Secondary | ICD-10-CM

## 2014-10-05 DIAGNOSIS — R7309 Other abnormal glucose: Secondary | ICD-10-CM

## 2014-10-05 DIAGNOSIS — I10 Essential (primary) hypertension: Secondary | ICD-10-CM | POA: Diagnosis not present

## 2014-10-05 DIAGNOSIS — R7303 Prediabetes: Secondary | ICD-10-CM

## 2014-10-05 DIAGNOSIS — Z23 Encounter for immunization: Secondary | ICD-10-CM | POA: Diagnosis not present

## 2014-10-05 NOTE — Patient Instructions (Addendum)
Dear Patient,  It has been recommended to you that you have a colonoscopy. It is your responsibility to carry through with this recommendation.   Did you realize that colon cancer is the second leading cancer killer in the [place]. One in every 20 adults will get colon cancer. If all adults would go through the recommended screening for colon cancer (getting a colonoscopy), then there would be a 60% reduction in the number of people dying from colon cancer.  Colon cancer just doesn't come out of the blue. It starts off as a small polyp which over time grows into a cancer. A colonoscopy can prevent cancer and in many cases detected when it is at a very treatable phase. Small colon cancers can have cure rates of 95%. Advanced colon cancer, which often occurs in people who do not do their screenings, have cure rates less than 20%. The risk of colon cancer advances with age. Most adults should have regular colonoscopies every 10 years starting at age 31. This recommendation can vary depending on a person's medical history.  Health-care laws now allow for you to call the gastroenterologist office directly in order to set yourself up for this very important tests. Today we have recommended to you that you do this test. This test may save your life. Failure to do this test puts you at risk for premature death from colon cancer. Do the right thing and schedule this test now.  Here as a list of specialists we recommend in the surrounding area. When you call their office let them know that you are a patient of our practice in your interested in doing a screening colonoscopy. They should assist you without problems. You will need the following information when you called them: 1-name of which Dr. you see, 2-your insurance information, 3-a list of medications that you currently take, 4-any allergies you have to medications.  [place] gastroenterologist Dr. Milton Ferguson, Dr Felicie Morn  gastroenterologist   [facility]   [phone]  [facility] gastroenterology (Dr. Garnetta Buddy and [place]) [phone]  [facility] gastroenterology (Dr. Leia Alf, Harrietta Guardian, [place]) [phone]  Each group of specialists has assured [place] that when you called them they will help you get your colonoscopy set up. Should you have problems please let us know. Be sure to call soon. Sincerely, Pearson Forster, Dr Mickie Hillier, Dr.Bert Ptacek     Diabetes Mellitus and Food It is important for you to manage your blood sugar (glucose) level. Your blood glucose level can be greatly affected by what you eat. Eating healthier foods in the appropriate amounts throughout the day at about the same time each day will help you control your blood glucose level. It can also help slow or prevent worsening of your diabetes mellitus. Healthy eating may even help you improve the level of your blood pressure and reach or maintain a healthy weight.  HOW CAN FOOD AFFECT ME? Carbohydrates Carbohydrates affect your blood glucose level more than any other type of food. Your dietitian will help you determine how many carbohydrates to eat at each meal and teach you how to count carbohydrates. Counting carbohydrates is important to keep your blood glucose at a healthy level, especially if you are using insulin or taking certain medicines for diabetes mellitus. Alcohol Alcohol can cause sudden decreases in blood glucose (hypoglycemia), especially if you use insulin or take certain medicines for diabetes mellitus. Hypoglycemia can  be a life-threatening condition. Symptoms of hypoglycemia (sleepiness, dizziness, and disorientation) are similar to symptoms of having too much alcohol.  If your health care provider has given you approval to drink alcohol, do so in moderation and use the following guidelines:  Women  should not have more than one drink per day, and men should not have more than two drinks per day. One drink is equal to:  12 oz of beer.  5 oz of wine.  1 oz of hard liquor.  Do not drink on an empty stomach.  Keep yourself hydrated. Have water, diet soda, or unsweetened iced tea.  Regular soda, juice, and other mixers might contain a lot of carbohydrates and should be counted. WHAT FOODS ARE NOT RECOMMENDED? As you make food choices, it is important to remember that all foods are not the same. Some foods have fewer nutrients per serving than other foods, even though they might have the same number of calories or carbohydrates. It is difficult to get your body what it needs when you eat foods with fewer nutrients. Examples of foods that you should avoid that are high in calories and carbohydrates but low in nutrients include:  Trans fats (most processed foods list trans fats on the Nutrition Facts label).  Regular soda.  Juice.  Candy.  Sweets, such as cake, pie, doughnuts, and cookies.  Fried foods. WHAT FOODS CAN I EAT? Have nutrient-rich foods, which will nourish your body and keep you healthy. The food you should eat also will depend on several factors, including:  The calories you need.  The medicines you take.  Your weight.  Your blood glucose level.  Your blood pressure level.  Your cholesterol level. You also should eat a variety of foods, including:  Protein, such as meat, poultry, fish, tofu, nuts, and seeds (lean animal proteins are best).  Fruits.  Vegetables.  Dairy products, such as milk, cheese, and yogurt (low fat is best).  Breads, grains, pasta, cereal, rice, and beans.  Fats such as olive oil, trans fat-free margarine, canola oil, avocado, and olives. DOES EVERYONE WITH DIABETES MELLITUS HAVE THE SAME MEAL PLAN? Because every person with diabetes mellitus is different, there is not one meal plan that works for everyone. It is very important  that you meet with a dietitian who will help you create a meal plan that is just right for you. Document Released: 11/10/2004 Document Revised: 02/18/2013 Document Reviewed: 01/10/2013 [facility] Patient Information 2015 [place], [place]. This information is not intended to replace advice given to you by your health care provider. Make sure you discuss any questions you have with your health care provider.

## 2014-10-05 NOTE — Progress Notes (Signed)
   Subjective:    Patient ID: [name], female    DOB: March 27, 1950, 64 y.o.   MRN: [id number]  Hypertension This is a chronic problem. The current episode started more than 1 year ago. Pertinent negatives include no chest pain. Risk factors for coronary artery disease include dyslipidemia and post-menopausal state. Treatments tried: lotensin. There are no compliance problems.    Discuss recent lab work Patient does state she takes in too much starches she is very busy with her job so therefore she does not exercise Patient relates compliance with medications is trying to cut back the fat diet. Review of Systems  Constitutional: Negative for activity change, appetite change and fatigue.  HENT: Negative for congestion.   Respiratory: Negative for cough.   Cardiovascular: Negative for chest pain.  Gastrointestinal: Negative for abdominal pain.  Endocrine: Negative for polydipsia and polyphagia.  Neurological: Negative for weakness.  Psychiatric/Behavioral: Negative for confusion.       Objective:   Physical Exam  Constitutional: She appears well-nourished. No distress.  Cardiovascular: Normal rate, regular rhythm and normal heart sounds.   No murmur heard. Pulmonary/Chest: Effort normal and breath sounds normal. No respiratory distress.  Musculoskeletal: She exhibits no edema.  Lymphadenopathy:    She has no cervical adenopathy.  Neurological: She is alert. She exhibits normal muscle tone.  Psychiatric: Her behavior is normal.  Vitals reviewed.         Assessment & Plan:  1. Essential hypertension, benign Blood pressure easing control she needs to exercise more and lose weight and I recommend recheck this again in 6 months time continue current medication  2. Hyperlipidemia Cholesterol looks much better continue current measures  3. Prediabetes Minimize regular sodas regular exercise recommended weight reduction recommended

## 2015-01-11 ENCOUNTER — Ambulatory Visit (INDEPENDENT_AMBULATORY_CARE_PROVIDER_SITE_OTHER): Payer: Medicare Other | Admitting: Family Medicine

## 2015-01-11 ENCOUNTER — Ambulatory Visit (HOSPITAL_COMMUNITY)
Admission: RE | Admit: 2015-01-11 | Discharge: 2015-01-11 | Disposition: A | Discharge status: Home / Self Care | Payer: Medicare Other | Source: Ambulatory Visit | Attending: Family Medicine | Admitting: Family Medicine

## 2015-01-11 ENCOUNTER — Encounter: Payer: Self-pay | Admitting: Family Medicine

## 2015-01-11 VITALS — BP 126/86 | Temp 98.2°F | Ht 59.0 in | Wt 212.5 lb

## 2015-01-11 DIAGNOSIS — M6530 Trigger finger, unspecified finger: Secondary | ICD-10-CM | POA: Diagnosis not present

## 2015-01-11 DIAGNOSIS — E785 Hyperlipidemia, unspecified: Secondary | ICD-10-CM

## 2015-01-11 DIAGNOSIS — M25562 Pain in left knee: Secondary | ICD-10-CM | POA: Diagnosis not present

## 2015-01-11 DIAGNOSIS — Z23 Encounter for immunization: Secondary | ICD-10-CM

## 2015-01-11 DIAGNOSIS — M769 Unspecified enthesopathy, lower limb, excluding foot: Secondary | ICD-10-CM | POA: Insufficient documentation

## 2015-01-11 MED ORDER — PRAVASTATIN SODIUM 40 MG PO TABS: 40.0000 mg | ORAL_TABLET | Freq: Every day | ORAL | Status: DC

## 2015-01-11 MED ORDER — MELOXICAM 15 MG PO TABS: 15.0000 mg | ORAL_TABLET | Freq: Every day | ORAL | Status: DC

## 2015-01-11 NOTE — Progress Notes (Signed)
   Subjective:    Patient ID: [name], female    DOB: [birth year], 63 y.o.   MRN: [id number]  Leg Pain  The incident occurred more than 1 week ago. The incident occurred at home. There was no injury mechanism. The pain is present in the left leg. The quality of the pain is described as cramping. The pain is moderate. The pain has been intermittent since onset. She reports no foreign bodies present. Nothing aggravates the symptoms. Treatments tried: decreased pravastatin. The treatment provided moderate relief.   prresent for 1.5 months Worse past few weeks Knee and back of leg Pt thought it was the pravastatin seemed to be better on 40 than it was on the 80, made the change 10 days ago Patient states that she has no other concerns at this time.   Review of Systems  Constitutional: Negative for fever and fatigue.  Musculoskeletal: Positive for arthralgias. Negative for back pain.  pain in the left knee. Also intermittent arthralgias in other joints.     Objective:   Physical Exam Left knee crepitus noted ligament stable strength normal calf nontender no Baker's cyst area swelling       Assessment & Plan:  Significant left knee pain discomfort I find no evidence of Baker's cyst more than likely osteoarthritis I recommend commendation of anti-inflammatories plus x-ray may need referral to orthopedics if anti-inflammatory doesn't help enough for the possibility of injection  This patient also had to reduce her pravastatin from 80 mg to 40 mg because of arthralgias this seems reasonable.  Patient with occasional trigger finger in the right hand she will let us know when it gets bad enough that she would want referral to orthopedics.

## 2015-04-05 ENCOUNTER — Ambulatory Visit (INDEPENDENT_AMBULATORY_CARE_PROVIDER_SITE_OTHER): Payer: Medicare Other | Admitting: Family Medicine

## 2015-04-05 ENCOUNTER — Encounter: Payer: Self-pay | Admitting: Family Medicine

## 2015-04-05 VITALS — BP 122/70 | Ht 59.0 in | Wt 214.0 lb

## 2015-04-05 DIAGNOSIS — R7303 Prediabetes: Secondary | ICD-10-CM

## 2015-04-05 DIAGNOSIS — M1712 Unilateral primary osteoarthritis, left knee: Secondary | ICD-10-CM | POA: Diagnosis not present

## 2015-04-05 DIAGNOSIS — Z1211 Encounter for screening for malignant neoplasm of colon: Secondary | ICD-10-CM | POA: Diagnosis not present

## 2015-04-05 DIAGNOSIS — E785 Hyperlipidemia, unspecified: Secondary | ICD-10-CM | POA: Diagnosis not present

## 2015-04-05 DIAGNOSIS — I10 Essential (primary) hypertension: Secondary | ICD-10-CM | POA: Diagnosis not present

## 2015-04-05 DIAGNOSIS — M25562 Pain in left knee: Secondary | ICD-10-CM

## 2015-04-05 MED ORDER — BENAZEPRIL-HYDROCHLOROTHIAZIDE 20-25 MG PO TABS: 1.0000 | ORAL_TABLET | Freq: Every day | ORAL | Status: DC

## 2015-04-05 MED ORDER — PRAVASTATIN SODIUM 40 MG PO TABS: 40.0000 mg | ORAL_TABLET | Freq: Every day | ORAL | Status: DC

## 2015-04-05 MED ORDER — MELOXICAM 15 MG PO TABS: 15.0000 mg | ORAL_TABLET | Freq: Every day | ORAL | Status: DC

## 2015-04-05 NOTE — Progress Notes (Signed)
   Subjective:    Patient ID: [name], female    DOB: 10/16/1950, 65 y.o.   MRN: [id number]  Hypertension This is a chronic problem. The current episode started more than 1 year ago. The problem has been gradually improving since onset. The problem is controlled. There are no associated agents to hypertension. There are no known risk factors for coronary artery disease. Treatments tried: lotensin hct. The current treatment provides moderate improvement. There are no compliance problems.   Knee Pain  The incident occurred more than 1 week ago. There was no injury mechanism. The pain is present in the left knee. The quality of the pain is described as aching. The pain is at a severity of 5/10. The pain is moderate. The pain has been constant since onset. Associated symptoms include a loss of motion and muscle weakness. Pertinent negatives include no inability to bear weight, loss of sensation, numbness or tingling. The symptoms are aggravated by movement and weight bearing. She has tried NSAIDs for the symptoms. The treatment provided no relief.   Patient states that she wants to follow up on her left leg pain also today.  She does have prediabetes. We need to check her A1c think her encourage patient to watch diet stay physically active She has not had a colonoscopy in many years. This is recommended. I discussed different options regarding her knee orthopedics referral would be reasonable Blood pressure takes her medicine as directed denies  Review of Systems  Neurological: Negative for tingling and numbness.   denies chest tightness pressure pain shortness breath nausea vomiting diarrhea. Relates left knee pain discomfort does not lock or give way     Objective:   Physical Exam  Constitutional: She appears well-nourished. No distress.  Cardiovascular: Normal rate, regular rhythm and normal heart sounds.   No murmur heard. Pulmonary/Chest: Effort normal and breath sounds normal. No  respiratory distress.  Musculoskeletal: She exhibits no edema.  Lymphadenopathy:    She has no cervical adenopathy.  Neurological: She is alert. She exhibits normal muscle tone.  Psychiatric: Her behavior is normal.  Vitals reviewed.  Patients left knee mild arthritis noted no obvious swelling lower legs no swelling foot exam normal   25 minutes was spent with the patient. Greater than half the time was spent in discussion and answering questions and counseling regarding the issues that the patient came in for today.     Assessment & Plan:  1. Essential hypertension, benign Blood pressure good control continue current measures  2. Hyperlipidemia Continue medication check lab work. Watch diet stay active - Lipid panel - Hepatic function panel - Basic metabolic panel  3. Prediabetes Check A1c may end up needing medication added depending on what we find - Hemoglobin A1c  4. Primary osteoarthritis of left knee Referral to orthopedic surgery for her knee. Continue anti-inflammatory  5. Encounter for screening colonoscopy Patient needs colonoscopy has been reluctant to get it done in the past she has had one many years ago - Ambulatory referral to Gastroenterology  6. Left knee pain Referral to orthopedic surgery - Ambulatory referral to Orthopedic Surgery

## 2015-04-06 ENCOUNTER — Encounter: Payer: Self-pay | Admitting: Family Medicine

## 2015-04-06 DIAGNOSIS — E119 Type 2 diabetes mellitus without complications: Secondary | ICD-10-CM | POA: Insufficient documentation

## 2015-04-06 LAB — HEPATIC FUNCTION PANEL
ALT: 19 IU/L (ref 0–32)
AST: 22 IU/L (ref 0–40)
Albumin: 4.2 g/dL (ref 3.6–4.8)
Alkaline Phosphatase: 111 IU/L (ref 39–117)
BILIRUBIN, DIRECT: 0.13 mg/dL (ref 0.00–0.40)
Bilirubin Total: 0.4 mg/dL (ref 0.0–1.2)
TOTAL PROTEIN: 7.1 g/dL (ref 6.0–8.5)

## 2015-04-06 LAB — BASIC METABOLIC PANEL
BUN/Creatinine Ratio: 20 (ref 11–26)
BUN: 14 mg/dL (ref 8–27)
CALCIUM: 9.4 mg/dL (ref 8.7–10.3)
CO2: 23 mmol/L (ref 18–29)
CREATININE: 0.7 mg/dL (ref 0.57–1.00)
Chloride: 103 mmol/L (ref 96–106)
GFR calc Af Amer: 105 mL/min/{1.73_m2} (ref >59)
GFR calc non Af Amer: 91 mL/min/{1.73_m2} (ref >59)
GLUCOSE: 117 mg/dL — AB (ref 65–99)
Potassium: 4.1 mmol/L (ref 3.5–5.2)
Sodium: 142 mmol/L (ref 134–144)

## 2015-04-06 LAB — LIPID PANEL
CHOL/HDL RATIO: 3.2 ratio (ref 0.0–4.4)
Cholesterol, Total: 164 mg/dL (ref 100–199)
HDL: 51 mg/dL (ref >39)
LDL Calculated: 101 mg/dL — ABNORMAL HIGH (ref 0–99)
Triglycerides: 60 mg/dL (ref 0–149)
VLDL CHOLESTEROL CAL: 12 mg/dL (ref 5–40)

## 2015-04-06 LAB — HEMOGLOBIN A1C
ESTIMATED AVERAGE GLUCOSE: 140 mg/dL
HEMOGLOBIN A1C: 6.5 % — AB (ref 4.8–5.6)

## 2015-04-06 MED ORDER — METFORMIN HCL 500 MG PO TABS: ORAL_TABLET | ORAL | Status: DC

## 2015-04-06 NOTE — Addendum Note (Signed)
Addended by: Ofilia Neas R on: 04/06/2015 10:12 AM   Modules accepted: Orders

## 2015-04-07 ENCOUNTER — Encounter: Payer: Self-pay | Admitting: Family Medicine

## 2015-04-08 ENCOUNTER — Telehealth: Payer: Self-pay | Admitting: Family Medicine

## 2015-04-08 NOTE — Telephone Encounter (Signed)
#  1 you can either write a letter to the patient reports call her stating that there are lower cost options that are similar to what she is on but not the same. In my opinion weeks ago either way. If we did change to the lower cost/no cost medicine he would need to recheck her in 4-6 weeks to make sure her blood pressure is good

## 2015-04-09 ENCOUNTER — Encounter: Payer: Self-pay | Admitting: Family Medicine

## 2015-04-09 ENCOUNTER — Other Ambulatory Visit: Payer: Self-pay | Admitting: Family Medicine

## 2015-04-09 MED ORDER — ENALAPRIL-HYDROCHLOROTHIAZIDE 10-25 MG PO TABS: 1.0000 | ORAL_TABLET | Freq: Every day | ORAL | Status: DC

## 2015-04-09 NOTE — Telephone Encounter (Signed)
The new medicine was sent in to [facility]. Patient to follow-up within 4-6 weeks to recheck blood pressure, stop benazepril

## 2015-04-09 NOTE — Telephone Encounter (Signed)
Discussed with pt, yes she would like to be changed to one of the lower cost options for her BP medicine, her current medicine went from not costing her anything to $100/month Pt verbalized understanding that Dr. Nicki Reaper requested her to follow up here in 4-6 weeks to check BP after medicine change   Lower cost options:  captopril-hydrochlorothiazide tablet, enalapril-hydrochlorothiazide tablet     Please notify pt when this is done

## 2015-04-09 NOTE — Telephone Encounter (Signed)
Spoke with patient and informed her per Dr.Scott Luking- The new medicine was sent [facility] to [facility]. Patient to follow-up within 4-6 weeks to recheck blood pressure, stop benazepril. Patient verbalized understanding.

## 2015-04-20 DIAGNOSIS — M25562 Pain in left knee: Secondary | ICD-10-CM | POA: Diagnosis not present

## 2015-04-20 DIAGNOSIS — M1712 Unilateral primary osteoarthritis, left knee: Secondary | ICD-10-CM | POA: Diagnosis not present

## 2015-05-14 DIAGNOSIS — M1712 Unilateral primary osteoarthritis, left knee: Secondary | ICD-10-CM | POA: Diagnosis not present

## 2015-05-14 DIAGNOSIS — M25562 Pain in left knee: Secondary | ICD-10-CM | POA: Diagnosis not present

## 2015-05-25 DIAGNOSIS — M25562 Pain in left knee: Secondary | ICD-10-CM | POA: Diagnosis not present

## 2015-06-01 DIAGNOSIS — M1712 Unilateral primary osteoarthritis, left knee: Secondary | ICD-10-CM | POA: Diagnosis not present

## 2015-06-01 DIAGNOSIS — M25562 Pain in left knee: Secondary | ICD-10-CM | POA: Diagnosis not present

## 2015-06-29 DIAGNOSIS — M1712 Unilateral primary osteoarthritis, left knee: Secondary | ICD-10-CM | POA: Diagnosis not present

## 2015-06-29 DIAGNOSIS — M25562 Pain in left knee: Secondary | ICD-10-CM | POA: Diagnosis not present

## 2015-07-05 ENCOUNTER — Ambulatory Visit (INDEPENDENT_AMBULATORY_CARE_PROVIDER_SITE_OTHER): Payer: Medicare Other | Admitting: Family Medicine

## 2015-07-05 ENCOUNTER — Encounter: Payer: Self-pay | Admitting: Family Medicine

## 2015-07-05 VITALS — BP 120/80 | Ht 59.0 in | Wt 202.2 lb

## 2015-07-05 DIAGNOSIS — E785 Hyperlipidemia, unspecified: Secondary | ICD-10-CM

## 2015-07-05 DIAGNOSIS — I10 Essential (primary) hypertension: Secondary | ICD-10-CM | POA: Diagnosis not present

## 2015-07-05 DIAGNOSIS — E119 Type 2 diabetes mellitus without complications: Secondary | ICD-10-CM

## 2015-07-05 LAB — POCT GLYCOSYLATED HEMOGLOBIN (HGB A1C): Hemoglobin A1C: 5.7

## 2015-07-05 NOTE — Progress Notes (Signed)
   Subjective:    Patient ID: [name], female    DOB: May 08, 1950, 65 y.o.   MRN: [id number]  Diabetes She presents for her follow-up diabetic visit. She has type 2 diabetes mellitus. No MedicAlert identification noted. Pertinent negatives for hypoglycemia include no confusion. Pertinent negatives for diabetes include no chest pain, no fatigue, no polydipsia, no polyphagia and no weakness. She has not had a previous visit with a dietitian. She does not see a podiatrist.Eye exam is not current.   Results for orders placed or performed in visit on 07/05/15  POCT HgB A1C  Result Value Ref Range   Hemoglobin A1C 5.7    Patient is having need trouble which she is seen orthopedics they're planning the possibility of surgery in the coming weeks for arthroscopic surgery  Patient states no other concerns this visit.  Review of Systems  Constitutional: Negative for activity change, appetite change and fatigue.  HENT: Negative for congestion.   Respiratory: Negative for cough.   Cardiovascular: Negative for chest pain.  Gastrointestinal: Negative for abdominal pain.  Endocrine: Negative for polydipsia and polyphagia.  Neurological: Negative for weakness.  Psychiatric/Behavioral: Negative for confusion.       Objective:   Physical Exam  Constitutional: She appears well-nourished. No distress.  Cardiovascular: Normal rate, regular rhythm and normal heart sounds.   No murmur heard. Pulmonary/Chest: Effort normal and breath sounds normal. No respiratory distress.  Musculoskeletal: She exhibits no edema.  Lymphadenopathy:    She has no cervical adenopathy.  Neurological: She is alert. She exhibits normal muscle tone.  Psychiatric: Her behavior is normal.  Vitals reviewed.         Assessment & Plan:  Diabetes-much improvement. Patient was encouraged to watch diet stay physically active as much as she can tolerate. Continue medication taken half a tablet metformin twice  daily  Chronic knee pain follow-up with orthopedics they may be doing arthroscopic surgery later this spring  Patient to follow-up in the fall comprehensive lab work and office visit at that time

## 2015-08-27 DIAGNOSIS — M1712 Unilateral primary osteoarthritis, left knee: Secondary | ICD-10-CM | POA: Diagnosis not present

## 2015-08-27 DIAGNOSIS — M94262 Chondromalacia, left knee: Secondary | ICD-10-CM | POA: Diagnosis not present

## 2015-08-27 DIAGNOSIS — Y999 Unspecified external cause status: Secondary | ICD-10-CM | POA: Diagnosis not present

## 2015-08-27 DIAGNOSIS — M23322 Other meniscus derangements, posterior horn of medial meniscus, left knee: Secondary | ICD-10-CM | POA: Diagnosis not present

## 2015-08-27 DIAGNOSIS — S83232A Complex tear of medial meniscus, current injury, left knee, initial encounter: Secondary | ICD-10-CM | POA: Diagnosis not present

## 2015-09-14 DIAGNOSIS — M1712 Unilateral primary osteoarthritis, left knee: Secondary | ICD-10-CM | POA: Diagnosis not present

## 2015-09-14 DIAGNOSIS — M23322 Other meniscus derangements, posterior horn of medial meniscus, left knee: Secondary | ICD-10-CM | POA: Diagnosis not present

## 2015-09-14 DIAGNOSIS — Z4789 Encounter for other orthopedic aftercare: Secondary | ICD-10-CM | POA: Diagnosis not present

## 2015-10-12 DIAGNOSIS — Z9889 Other specified postprocedural states: Secondary | ICD-10-CM | POA: Diagnosis not present

## 2015-10-12 DIAGNOSIS — Z4789 Encounter for other orthopedic aftercare: Secondary | ICD-10-CM | POA: Diagnosis not present

## 2015-12-06 ENCOUNTER — Ambulatory Visit (INDEPENDENT_AMBULATORY_CARE_PROVIDER_SITE_OTHER): Payer: Medicare Other | Admitting: Family Medicine

## 2015-12-06 ENCOUNTER — Telehealth: Payer: Self-pay | Admitting: Family Medicine

## 2015-12-06 ENCOUNTER — Encounter: Payer: Self-pay | Admitting: Family Medicine

## 2015-12-06 VITALS — BP 120/76 | Ht 59.0 in | Wt 202.0 lb

## 2015-12-06 DIAGNOSIS — R300 Dysuria: Secondary | ICD-10-CM | POA: Diagnosis not present

## 2015-12-06 DIAGNOSIS — E782 Mixed hyperlipidemia: Secondary | ICD-10-CM

## 2015-12-06 DIAGNOSIS — I10 Essential (primary) hypertension: Secondary | ICD-10-CM | POA: Diagnosis not present

## 2015-12-06 DIAGNOSIS — E785 Hyperlipidemia, unspecified: Secondary | ICD-10-CM

## 2015-12-06 DIAGNOSIS — E119 Type 2 diabetes mellitus without complications: Secondary | ICD-10-CM | POA: Diagnosis not present

## 2015-12-06 DIAGNOSIS — Z23 Encounter for immunization: Secondary | ICD-10-CM | POA: Diagnosis not present

## 2015-12-06 LAB — POCT URINALYSIS DIPSTICK
PH UA: 7
Spec Grav, UA: <=1.005
UROBILINOGEN UA: 4

## 2015-12-06 LAB — POCT GLYCOSYLATED HEMOGLOBIN (HGB A1C): HEMOGLOBIN A1C: 5.8

## 2015-12-06 NOTE — Telephone Encounter (Signed)
Patient states she was given Rx for pravachol, but its expensive and she called her insurance company to see what they would cover for her.  They cover: atovastatin 40 mg Simvastatin 40 mg  [facility] [place]

## 2015-12-06 NOTE — Progress Notes (Signed)
   Subjective:    Patient ID: [name], female    DOB: [birth year], 65 y.o.   MRN: [id number]  Diabetes  She presents for her follow-up diabetic visit. She has type 2 diabetes mellitus. Pertinent negatives for hypoglycemia include no confusion. Pertinent negatives for diabetes include no chest pain, no fatigue, no polydipsia, no polyphagia and no weakness. Home blood sugar record trend: pt does not check blood sugar.   Has noticied blood when she wipes and in underwear. Stinging when urinating. Off and on for over one year. Upon further questioning she is uncertain if this is blood she does this describes it as a pink color in her underwear in states occasionally she sees [place] when she wipes she denies any vaginal bleeding she has had a hysterectomy in the past denies rectal bleeding.  Review of Systems  Constitutional: Negative for activity change, appetite change and fatigue.  HENT: Negative for congestion.   Respiratory: Negative for cough.   Cardiovascular: Negative for chest pain.  Gastrointestinal: Negative for abdominal pain.  Endocrine: Negative for polydipsia and polyphagia.  Neurological: Negative for weakness.  Psychiatric/Behavioral: Negative for confusion.       Objective:   Physical Exam  Constitutional: She appears well-nourished. No distress.  Cardiovascular: Normal rate, regular rhythm and normal heart sounds.   No murmur heard. Pulmonary/Chest: Effort normal and breath sounds normal. No respiratory distress.  Musculoskeletal: She exhibits no edema.  Lymphadenopathy:    She has no cervical adenopathy.  Neurological: She is alert. She exhibits normal muscle tone.  Psychiatric: Her behavior is normal.  Vitals reviewed.    Patient going through some stress she lost her mother at age 50 720 heart attack     Assessment & Plan:  Diabetes A1c overall looks good watch diet encourage patient try to lose weight continue medication  Hyperlipidemia patient is having  some difficulty in regards to the cost of the medicine seems that causes gone way up which seems odd for it being pravastatin but patient will look into this further let us know if there is a different medicine she would be able to afford better  HTN good control overall, watch diet try lose weight  Urine culture-await the results of this. May need urologic workup. I also encourage patient to follow-up in the near future for a female exam to help rule out any other cause of blood. Urine today did not show any blood under the microscope except for an occasional white blood cell  Morbid obesity importance of trying to lose weight discussed  Osteoarthritis of the knee anti-inflammatory seems to be helping out try to stay active

## 2015-12-07 MED ORDER — ATORVASTATIN CALCIUM 40 MG PO TABS: 40.0000 mg | ORAL_TABLET | Freq: Every day | ORAL | 4 refills | Status: DC

## 2015-12-07 NOTE — Telephone Encounter (Signed)
Discontinue pravastatin. Use Lipitor 40 mg daily. #30, 4 refills, check lipid liver profile in approximately 8 weeks

## 2015-12-07 NOTE — Telephone Encounter (Signed)
Spoke with patient and informed her per Dr.Scott Luking- We are going to discontinue pravastatin and sending in Lipitor 40 mg take 1 daily. We will need to recheck your lipid, hepatic profile in approximately 8 weeks. Patient verbalized understanding.

## 2015-12-08 LAB — URINE CULTURE

## 2015-12-13 ENCOUNTER — Other Ambulatory Visit: Payer: Self-pay | Admitting: Family Medicine

## 2015-12-13 DIAGNOSIS — Z1231 Encounter for screening mammogram for malignant neoplasm of breast: Secondary | ICD-10-CM

## 2015-12-16 ENCOUNTER — Ambulatory Visit (INDEPENDENT_AMBULATORY_CARE_PROVIDER_SITE_OTHER): Payer: Medicare Other | Admitting: Nurse Practitioner

## 2015-12-16 ENCOUNTER — Encounter: Payer: Self-pay | Admitting: Nurse Practitioner

## 2015-12-16 VITALS — BP 132/86 | Ht 59.0 in | Wt 202.0 lb

## 2015-12-16 DIAGNOSIS — Z780 Asymptomatic menopausal state: Secondary | ICD-10-CM | POA: Diagnosis not present

## 2015-12-16 DIAGNOSIS — Z0000 Encounter for general adult medical examination without abnormal findings: Secondary | ICD-10-CM | POA: Diagnosis not present

## 2015-12-16 NOTE — Progress Notes (Signed)
   Subjective:    Patient ID: [name], female    DOB: Feb 19, 1951, 65 y.o.   MRN: [id number]  HPI AWV- Annual Wellness Visit  The patient was seen for their annual wellness visit. The patient's past medical history, surgical history, and family history were reviewed. Pertinent vaccines were reviewed ( tetanus, pneumonia, shingles, flu) The patient's medication list was reviewed and updated.  The height and weight were entered. The patient's current BMI is: 40.80   Cognitive screening was completed. Outcome of Mini - Cog: pass  Falls within the past 6 months: none  Current tobacco usage: none (All patients who use tobacco were given written and verbal information on quitting)  Recent listing of emergency department/hospitalizations over the past year were reviewed.  current specialist the patient sees on a regular basis: none   Medicare annual wellness visit patient questionnaire was reviewed.  A written screening schedule for the patient for the next 5-10 years was given. Appropriate discussion of followup regarding next visit was discussed.  Presents for her wellness exam. No pelvic pain. No new sexual partners. Regular dental exams. Needs vision exams. No regular exercise.      Review of Systems  Constitutional: Negative for activity change, appetite change and fatigue.  HENT: Negative for dental problem, ear pain, sinus pressure and sore throat.   Respiratory: Negative for cough, chest tightness, shortness of breath and wheezing.   Cardiovascular: Negative for chest pain.  Gastrointestinal: Negative for abdominal distention, abdominal pain, blood in stool, constipation, diarrhea, nausea and vomiting.  Genitourinary: Positive for enuresis. Negative for difficulty urinating, dysuria, frequency, genital sores, pelvic pain, urgency and vaginal discharge.       Objective:   Physical Exam  Constitutional: She is oriented to person, place, and time. She appears  well-developed. No distress.  HENT:  Right Ear: External ear normal.  Left Ear: External ear normal.  Mouth/Throat: Oropharynx is clear and moist.  Neck: Normal range of motion. Neck supple. No tracheal deviation present. No thyromegaly present.  Cardiovascular: Normal rate, regular rhythm and normal heart sounds.  Exam reveals no gallop.   No murmur heard. Pulmonary/Chest: Effort normal and breath sounds normal.  Abdominal: Soft. She exhibits no distension. There is no tenderness.  Genitourinary: Vagina normal. No vaginal discharge found.  Genitourinary Comments: External GU: no rashes or lesions. Vagina: no discharge. Bimanual exam: no obvious masses or tenderness but exam very limited due to abd girth and guarding during exam. Patient advised. Rectal exam: no masses; no stool for hemoccult.   Musculoskeletal: She exhibits no edema.  Lymphadenopathy:    She has no cervical adenopathy.  Neurological: She is alert and oriented to person, place, and time.  Skin: Skin is warm and dry. No rash noted.  Psychiatric: She has a normal mood and affect. Her behavior is normal.  Vitals reviewed. Breast exam: no masses; axillae no adenopathy.         Assessment & Plan:  Routine general medical examination at a health care facility - Plan: DG Bone Density  Post-menopausal - Plan: DG Bone Density  Recommend dilated eye exam, regular activity, weight loss and daily vitamin D/calcium supplement. Has a mammogram appointment next week. Plans to schedule her own colonoscopy. Given Zostavax Rx.  Return for follow up with Dr. Nicki Reaper as planned.

## 2015-12-17 ENCOUNTER — Encounter: Payer: Self-pay | Admitting: Nurse Practitioner

## 2015-12-20 DIAGNOSIS — M1611 Unilateral primary osteoarthritis, right hip: Secondary | ICD-10-CM | POA: Diagnosis not present

## 2015-12-20 DIAGNOSIS — M25561 Pain in right knee: Secondary | ICD-10-CM | POA: Diagnosis not present

## 2015-12-23 ENCOUNTER — Ambulatory Visit (HOSPITAL_COMMUNITY)
Admission: RE | Admit: 2015-12-23 | Discharge: 2015-12-23 | Disposition: A | Discharge status: Home / Self Care | Payer: Medicare Other | Source: Ambulatory Visit | Attending: Family Medicine | Admitting: Family Medicine

## 2015-12-23 ENCOUNTER — Ambulatory Visit (HOSPITAL_COMMUNITY)
Admission: RE | Admit: 2015-12-23 | Discharge: 2015-12-23 | Disposition: A | Discharge status: Home / Self Care | Payer: Medicare Other | Source: Ambulatory Visit | Attending: Nurse Practitioner | Admitting: Nurse Practitioner

## 2015-12-23 DIAGNOSIS — Z780 Asymptomatic menopausal state: Secondary | ICD-10-CM | POA: Insufficient documentation

## 2015-12-23 DIAGNOSIS — E2839 Other primary ovarian failure: Secondary | ICD-10-CM | POA: Diagnosis not present

## 2015-12-23 DIAGNOSIS — Z0000 Encounter for general adult medical examination without abnormal findings: Secondary | ICD-10-CM | POA: Insufficient documentation

## 2015-12-23 DIAGNOSIS — Z1231 Encounter for screening mammogram for malignant neoplasm of breast: Secondary | ICD-10-CM | POA: Diagnosis not present

## 2015-12-24 ENCOUNTER — Other Ambulatory Visit: Payer: Self-pay | Admitting: *Deleted

## 2015-12-24 MED ORDER — ENALAPRIL-HYDROCHLOROTHIAZIDE 10-25 MG PO TABS: 1.0000 | ORAL_TABLET | Freq: Every day | ORAL | 5 refills | Status: DC

## 2016-03-13 ENCOUNTER — Ambulatory Visit (INDEPENDENT_AMBULATORY_CARE_PROVIDER_SITE_OTHER): Payer: Medicare Other | Admitting: Nurse Practitioner

## 2016-03-13 ENCOUNTER — Encounter: Payer: Self-pay | Admitting: Nurse Practitioner

## 2016-03-13 VITALS — BP 110/72 | Temp 98.1°F | Ht 59.0 in | Wt 205.0 lb

## 2016-03-13 DIAGNOSIS — J111 Influenza due to unidentified influenza virus with other respiratory manifestations: Secondary | ICD-10-CM | POA: Diagnosis not present

## 2016-03-13 MED ORDER — OSELTAMIVIR PHOSPHATE 75 MG PO CAPS: 75.0000 mg | ORAL_CAPSULE | Freq: Two times a day (BID) | ORAL | 0 refills | Status: DC

## 2016-03-13 MED ORDER — HYDROCODONE-HOMATROPINE 5-1.5 MG/5ML PO SYRP: 5.0000 mL | ORAL_SOLUTION | ORAL | 0 refills | Status: DC | PRN

## 2016-03-13 NOTE — Progress Notes (Signed)
Subjective:  Presents for complaints of fever sore throat headache runny nose and cough that began less than 48 hours ago. Ear pain. No wheezing. Body aches. Malaise. No vomiting diarrhea or abdominal pain. Taking fluids well. Voiding normal limit. Did have flu vaccine.  Objective:   BP 110/72   Temp 98.1 F (36.7 C) (Oral)   Ht 4\' 11"  (1.499 m)   Wt 205 lb (93 kg)   BMI 41.40 kg/m  NAD. Alert, fatigued in appearance. TMs clear effusion, no erythema. Pharynx clear. Neck supple with mild soft anterior adenopathy. Lungs clear. Heart regular rate rhythm.  Assessment:  Influenza    Plan:  Meds ordered this encounter  Medications  . oseltamivir (TAMIFLU) 75 MG capsule    Sig: Take 1 capsule (75 mg total) by mouth 2 (two) times daily.    Dispense:  10 capsule    Refill:  0    Order Specific Question:   Supervising Provider    Answer:   Mikey Kirschner [[id number]]  . HYDROcodone-homatropine (HYCODAN) 5-1.5 MG/5ML syrup    Sig: Take 5 mLs by mouth every 4 (four) hours as needed.    Dispense:  120 mL    Refill:  0    Order Specific Question:   Supervising Provider    Answer:   Mikey Kirschner [[id number]]   Reviewed symptomatic care and warning signs. Call back in 72 hours if no improvement, sooner if worse. Also recommended patient monitor her blood sugars.

## 2016-03-13 NOTE — Patient Instructions (Signed)

## 2016-04-25 ENCOUNTER — Emergency Department (HOSPITAL_COMMUNITY)
Admission: EM | Admit: 2016-04-25 | Discharge: 2016-04-25 | Disposition: A | Discharge status: Home / Self Care | Payer: Medicare Other | Attending: Emergency Medicine | Admitting: Emergency Medicine

## 2016-04-25 ENCOUNTER — Emergency Department (HOSPITAL_COMMUNITY): Payer: Medicare Other

## 2016-04-25 ENCOUNTER — Ambulatory Visit (INDEPENDENT_AMBULATORY_CARE_PROVIDER_SITE_OTHER): Payer: Medicare Other | Admitting: Family Medicine

## 2016-04-25 ENCOUNTER — Encounter (HOSPITAL_COMMUNITY): Payer: Self-pay | Admitting: *Deleted

## 2016-04-25 VITALS — BP 144/92 | Temp 98.3°F | Ht 59.0 in

## 2016-04-25 DIAGNOSIS — R270 Ataxia, unspecified: Secondary | ICD-10-CM | POA: Diagnosis not present

## 2016-04-25 DIAGNOSIS — R51 Headache: Secondary | ICD-10-CM | POA: Diagnosis not present

## 2016-04-25 DIAGNOSIS — Z5181 Encounter for therapeutic drug level monitoring: Secondary | ICD-10-CM | POA: Insufficient documentation

## 2016-04-25 DIAGNOSIS — H81399 Other peripheral vertigo, unspecified ear: Secondary | ICD-10-CM | POA: Insufficient documentation

## 2016-04-25 DIAGNOSIS — Z7984 Long term (current) use of oral hypoglycemic drugs: Secondary | ICD-10-CM | POA: Insufficient documentation

## 2016-04-25 DIAGNOSIS — R42 Dizziness and giddiness: Secondary | ICD-10-CM | POA: Diagnosis not present

## 2016-04-25 DIAGNOSIS — Z79899 Other long term (current) drug therapy: Secondary | ICD-10-CM | POA: Diagnosis not present

## 2016-04-25 DIAGNOSIS — E119 Type 2 diabetes mellitus without complications: Secondary | ICD-10-CM | POA: Diagnosis not present

## 2016-04-25 DIAGNOSIS — I10 Essential (primary) hypertension: Secondary | ICD-10-CM | POA: Insufficient documentation

## 2016-04-25 DIAGNOSIS — Z87891 Personal history of nicotine dependence: Secondary | ICD-10-CM | POA: Diagnosis not present

## 2016-04-25 LAB — COMPREHENSIVE METABOLIC PANEL
ALK PHOS: 86 U/L (ref 38–126)
ALT: 20 U/L (ref 14–54)
ANION GAP: 6 (ref 5–15)
AST: 23 U/L (ref 15–41)
Albumin: 3.9 g/dL (ref 3.5–5.0)
BUN: 14 mg/dL (ref 6–20)
CALCIUM: 9.2 mg/dL (ref 8.9–10.3)
CO2: 27 mmol/L (ref 22–32)
Chloride: 106 mmol/L (ref 101–111)
Creatinine, Ser: 0.72 mg/dL (ref 0.44–1.00)
GFR calc non Af Amer: >60 mL/min (ref >60)
Glucose, Bld: 166 mg/dL — ABNORMAL HIGH (ref 65–99)
Potassium: 3.5 mmol/L (ref 3.5–5.1)
SODIUM: 139 mmol/L (ref 135–145)
TOTAL PROTEIN: 7.6 g/dL (ref 6.5–8.1)
Total Bilirubin: 0.6 mg/dL (ref 0.3–1.2)

## 2016-04-25 LAB — I-STAT CHEM 8, ED
BUN: 15 mg/dL (ref 6–20)
Calcium, Ion: 1.21 mmol/L (ref 1.15–1.40)
Chloride: 102 mmol/L (ref 101–111)
Creatinine, Ser: 0.7 mg/dL (ref 0.44–1.00)
Glucose, Bld: 166 mg/dL — ABNORMAL HIGH (ref 65–99)
HEMATOCRIT: 42 % (ref 36.0–46.0)
HEMOGLOBIN: 14.3 g/dL (ref 12.0–15.0)
Potassium: 3.7 mmol/L (ref 3.5–5.1)
SODIUM: 142 mmol/L (ref 135–145)
TCO2: 30 mmol/L (ref 0–100)

## 2016-04-25 LAB — APTT: aPTT: 35 seconds (ref 24–36)

## 2016-04-25 LAB — CBC
HCT: 40.6 % (ref 36.0–46.0)
Hemoglobin: 13.7 g/dL (ref 12.0–15.0)
MCH: 30.6 pg (ref 26.0–34.0)
MCHC: 33.7 g/dL (ref 30.0–36.0)
MCV: 90.8 fL (ref 78.0–100.0)
PLATELETS: 277 10*3/uL (ref 150–400)
RBC: 4.47 MIL/uL (ref 3.87–5.11)
RDW: 14 % (ref 11.5–15.5)
WBC: 5.9 10*3/uL (ref 4.0–10.5)

## 2016-04-25 LAB — DIFFERENTIAL
Basophils Absolute: 0 10*3/uL (ref 0.0–0.1)
Basophils Relative: 0 %
EOS PCT: 1 %
Eosinophils Absolute: 0.1 10*3/uL (ref 0.0–0.7)
LYMPHS PCT: 43 %
Lymphs Abs: 2.6 10*3/uL (ref 0.7–4.0)
MONO ABS: 0.3 10*3/uL (ref 0.1–1.0)
Monocytes Relative: 6 %
Neutro Abs: 3 10*3/uL (ref 1.7–7.7)
Neutrophils Relative %: 50 %

## 2016-04-25 LAB — PROTIME-INR
INR: 0.99
PROTHROMBIN TIME: 13.1 s (ref 11.4–15.2)

## 2016-04-25 LAB — CBG MONITORING, ED: Glucose-Capillary: 162 mg/dL — ABNORMAL HIGH (ref 65–99)

## 2016-04-25 LAB — TROPONIN I

## 2016-04-25 MED ORDER — MECLIZINE HCL 25 MG PO TABS: 25.0000 mg | ORAL_TABLET | Freq: Three times a day (TID) | ORAL | 0 refills | Status: DC | PRN

## 2016-04-25 MED ORDER — MECLIZINE HCL 12.5 MG PO TABS
25.0000 mg | ORAL_TABLET | Freq: Once | ORAL | Status: AC
  Administered 2016-04-25: 25 mg via ORAL
  Filled 2016-04-25: qty 2

## 2016-04-25 NOTE — ED Triage Notes (Signed)
Pt comes in with a headache and dizziness starting Saturday. Pt was sent here by Dr. Malachy Moan office for rule out stroke. Pt denies any unilateral weakness. Pt is ambulatory upon triage and has bilateral equal grips. NAD noted. Pt denies any speech problems or difficulty talking.

## 2016-04-25 NOTE — Discharge Instructions (Signed)
Take the prescription as directed.  Call your regular medical doctor today to schedule a follow up appointment within the next 2 days.  Return to the Emergency Department immediately sooner if worsening.  ° °

## 2016-04-25 NOTE — ED Provider Notes (Signed)
[facility] DEPT Provider Note   CSN: [id number] Arrival date & time: 04/25/16  1207     History   Chief Complaint Chief Complaint  Patient presents with  . Dizziness    HPI [name] is a 66 y.o. female.  HPI  Pt was seen at 1230. Per pt, c/o sudden onset and persistence of multiple intermittent episodes of "dizziness" that began 3 days ago. Pt describes the dizziness as "swimmy headed" and "everything is moving around." Has been associated with nausea. Symptoms worsen with movement of her head side to side and ambulation. Pt was evaluated by her PMD PTA, then sent to the ED for further evaluation to "r/o stroke." Denies CP/palpitations, no SOB/cough, no abd pain, no vomiting/diarrhea, no neck or back pain, no falls, no visual changes, no focal motor weakness, no tingling/numbness in extremities, no ataxia, no slurred speech, no facial droop.    Past Medical History:  Diagnosis Date  . Hyperlipidemia   . Hypertension   . Mild sleep apnea   . Prediabetes   . Prolonged QT interval     Patient Active Problem List   Diagnosis Date Noted  . Morbid obesity ([place]) 12/06/2015  . Type 2 diabetes mellitus ([place]) 04/06/2015  . Hyperlipidemia 08/11/2013  . Pedal edema 08/11/2013  . Lumbar pain 08/11/2013  . Essential hypertension, benign 05/30/2012  . Prediabetes 05/30/2012  . CERVICAL RADICULITIS 09/15/2009    Past Surgical History:  Procedure Laterality Date  . ABDOMINAL HYSTERECTOMY    . CHOLECYSTECTOMY    . COLONOSCOPY      OB History    No data available       Home Medications    Prior to Admission medications   Medication Sig Start Date End Date Taking? Authorizing Provider  atorvastatin (LIPITOR) 40 MG tablet Take 1 tablet (40 mg total) by mouth daily. 12/07/15  Yes Kathyrn Drown, MD  enalapril-hydrochlorothiazide (VASERETIC) 10-25 MG tablet Take 1 tablet by mouth daily. 12/24/15  Yes Kathyrn Drown, MD  meloxicam (MOBIC) 15 MG tablet Take 15 mg by  mouth daily.   Yes Historical Provider, MD  metFORMIN (GLUCOPHAGE) 500 MG tablet Take 1/2 tablet by mouth with breakfast and dinner for two weeks. Then take 1 tablet by mouth with breakfast and dinner. 04/06/15  Yes Kathyrn Drown, MD    Family History Family History  Problem Relation Age of Onset  . Heart disease Mother   . Diabetes Mother   . Arthritis Father   . Cancer Father     prostate  . Diabetes Maternal Grandmother     Social History Social History  Substance Use Topics  . Smoking status: Former Smoker    Types: Cigarettes    Quit date: 06/17/1992  . Smokeless tobacco: Never Used     Comment: less than a pack a week  . Alcohol use No     Allergies   Patient has no known allergies.   Review of Systems Review of Systems ROS: Statement: All systems negative except as marked or noted in the HPI; Constitutional: Negative for fever and chills. ; ; Eyes: Negative for eye pain, redness and discharge. ; ; ENMT: Negative for ear pain, hoarseness, nasal congestion, sinus pressure and sore throat. ; ; Cardiovascular: Negative for chest pain, palpitations, diaphoresis, dyspnea and peripheral edema. ; ; Respiratory: Negative for cough, wheezing and stridor. ; ; Gastrointestinal: +nausea. Negative for vomiting, diarrhea, abdominal pain, blood in stool, hematemesis, jaundice and rectal bleeding. . ; ;  Genitourinary: Negative for dysuria, flank pain and hematuria. ; ; Musculoskeletal: Negative for back pain and neck pain. Negative for swelling and trauma.; ; Skin: Negative for pruritus, rash, abrasions, blisters, bruising and skin lesion.; ; Neuro: +"swimmy headed." Negative for headache, lightheadedness and neck stiffness. Negative for weakness, altered level of consciousness, altered mental status, extremity weakness, paresthesias, involuntary movement, seizure and syncope.      Physical Exam Updated Vital Signs BP 172/78 (BP Location: Left Arm)   Pulse 81   Temp 98.6 F (37 C)    Resp 22   Ht 4\' 11"  (1.499 m)   Wt 205 lb (93 kg)   SpO2 97%   BMI 41.40 kg/m    12:37 Orthostatic Vital Signs CS  Orthostatic Lying   BP- Lying: 167/81  Pulse- Lying: 76      Orthostatic Sitting  BP- Sitting: 156/64  Pulse- Sitting: 96      Orthostatic Standing at 0 minutes  BP- Standing at 0 minutes:  194/96  Pulse- Standing at 0 minutes: 88    Physical Exam 1235: Physical examination:  Nursing notes reviewed; Vital signs and O2 SAT reviewed;  Constitutional: Well developed, Well nourished, Well hydrated, In no acute distress; Head:  Normocephalic, atraumatic; Eyes: EOMI, PERRL, No scleral icterus; ENMT: Mouth and pharynx normal, Mucous membranes moist; Neck: Supple, Full range of motion, No lymphadenopathy; Cardiovascular: Regular rate and rhythm, No gallop; Respiratory: Breath sounds clear & equal bilaterally, No wheezes.  Speaking full sentences with ease, Normal respiratory effort/excursion; Chest: Nontender, Movement normal; Abdomen: Soft, Nontender, Nondistended, Normal bowel sounds; Genitourinary: No CVA tenderness; Extremities: Pulses normal, No tenderness, No edema, No calf edema or asymmetry.; Neuro: AA&Ox3, Major CN grossly intact. Speech clear.  No facial droop.  +right horizontal end gaze fatigable nystagmus. Grips equal. Strength 5/5 equal bilat UE's and LE's.  DTR 2/4 equal bilat UE's and LE's.  No gross sensory deficits.  Normal cerebellar testing bilat UE's (finger-nose) and LE's (heel-shin)..; Skin: Color normal, Warm, Dry.   ED Treatments / Results  Labs (all labs ordered are listed, but only abnormal results are displayed)   EKG  EKG Interpretation  Date/Time:  Tuesday April 25 2016 12:29:09 EST Ventricular Rate:  89 PR Interval:    QRS Duration: 84 QT Interval:  372 QTC Calculation: 453 R Axis:   -16 Text Interpretation:  Sinus rhythm Borderline left axis deviation Baseline wander No old tracing to compare Confirmed by [facility]  MD, Nunzio Cory  [phone]) on 04/25/2016 1:32:26 PM       Radiology   Procedures Procedures (including critical care time)  Medications Ordered in ED Medications  meclizine (ANTIVERT) tablet 25 mg (25 mg Oral Given 04/25/16 1246)     Initial Impression / Assessment and Plan / ED Course  I have reviewed the triage vital signs and the nursing notes.  Pertinent labs & imaging results that were available during my care of the patient were reviewed by me and considered in my medical decision making (see chart for details).  MDM Reviewed: previous chart, nursing note and vitals Reviewed previous: labs and ECG Interpretation: labs, ECG, MRI and CT scan   Results for orders placed or performed during the hospital encounter of 04/25/16  Protime-INR  Result Value Ref Range   Prothrombin Time 13.1 11.4 - 15.2 seconds   INR 0.99   APTT  Result Value Ref Range   aPTT 35 24 - 36 seconds  CBC  Result Value Ref Range   WBC 5.9 4.0 -  10.5 K/uL   RBC 4.47 3.87 - 5.11 MIL/uL   Hemoglobin 13.7 12.0 - 15.0 g/dL   HCT 40.6 36.0 - 46.0 %   MCV 90.8 78.0 - 100.0 fL   MCH 30.6 26.0 - 34.0 pg   MCHC 33.7 30.0 - 36.0 g/dL   RDW 14.0 11.5 - 15.5 %   Platelets 277 150 - 400 K/uL  Differential  Result Value Ref Range   Neutrophils Relative % 50 %   Neutro Abs 3.0 1.7 - 7.7 K/uL   Lymphocytes Relative 43 %   Lymphs Abs 2.6 0.7 - 4.0 K/uL   Monocytes Relative 6 %   Monocytes Absolute 0.3 0.1 - 1.0 K/uL   Eosinophils Relative 1 %   Eosinophils Absolute 0.1 0.0 - 0.7 K/uL   Basophils Relative 0 %   Basophils Absolute 0.0 0.0 - 0.1 K/uL  Comprehensive metabolic panel  Result Value Ref Range   Sodium 139 135 - 145 mmol/L   Potassium 3.5 3.5 - 5.1 mmol/L   Chloride 106 101 - 111 mmol/L   CO2 27 22 - 32 mmol/L   Glucose, Bld 166 (H) 65 - 99 mg/dL   BUN 14 6 - 20 mg/dL   Creatinine, Ser 0.72 0.44 - 1.00 mg/dL   Calcium 9.2 8.9 - 10.3 mg/dL   Total Protein 7.6 6.5 - 8.1 g/dL   Albumin 3.9 3.5 - 5.0 g/dL    AST 23 15 - 41 U/L   ALT 20 14 - 54 U/L   Alkaline Phosphatase 86 38 - 126 U/L   Total Bilirubin 0.6 0.3 - 1.2 mg/dL   GFR calc non Af Amer >60 >60 mL/min   GFR calc Af Amer >60 >60 mL/min   Anion gap 6 5 - 15  Troponin I  Result Value Ref Range   Troponin I <0.03 <0.03 ng/mL  CBG monitoring, ED  Result Value Ref Range   Glucose-Capillary 162 (H) 65 - 99 mg/dL  I-Stat Chem 8, ED  Result Value Ref Range   Sodium 142 135 - 145 mmol/L   Potassium 3.7 3.5 - 5.1 mmol/L   Chloride 102 101 - 111 mmol/L   BUN 15 6 - 20 mg/dL   Creatinine, Ser 0.70 0.44 - 1.00 mg/dL   Glucose, Bld 166 (H) 65 - 99 mg/dL   Calcium, Ion 1.21 1.15 - 1.40 mmol/L   TCO2 30 0 - 100 mmol/L   Hemoglobin 14.3 12.0 - 15.0 g/dL   HCT 42.0 36.0 - 46.0 %   Ct Head Wo Contrast Result Date: 04/25/2016 CLINICAL DATA:  66 year old female with headache and dizziness for 4 days. Initial encounter. EXAM: CT HEAD WITHOUT CONTRAST TECHNIQUE: Contiguous axial images were obtained from the base of the skull through the vertex without intravenous contrast. COMPARISON:  Brain MRI from today reported separately. FINDINGS: Brain: No midline shift, ventriculomegaly, mass effect, evidence of mass lesion, intracranial hemorrhage or evidence of cortically based acute infarction. Gray-white matter differentiation is within normal limits aside from patchy white matter hypodensity most notable in the left parietal lobe. See MRI findings today. Vascular: Mild Calcified atherosclerosis at the skull base. No suspicious intracranial vascular hyperdensity. Skull: No acute osseous abnormality identified. Sinuses/Orbits: Clear. Other: Negative orbit and scalp soft tissues. IMPRESSION: 1.  No acute intracranial abnormality. 2. Mild nonspecific cerebral white matter changes, as seen on the brain MRI today reported separately. Electronically Signed   By: Genevie Ann M.D.   On: 04/25/2016 14:03   Mr Brain Lottie Dawson  Contrast (neuro Protocol) Result Date:  04/25/2016 CLINICAL DATA:  66 year old female with dizziness and headache for 3 days. Initial encounter. EXAM: MRI HEAD WITHOUT CONTRAST TECHNIQUE: Multiplanar, multiecho pulse sequences of the brain and surrounding structures were obtained without intravenous contrast. COMPARISON:  Brain MRI 08/02/2004. FINDINGS: Brain: Cerebral volume is within normal limits for age. No restricted diffusion to suggest acute infarction. No midline shift, mass effect, evidence of mass lesion, ventriculomegaly, extra-axial collection or acute intracranial hemorrhage. Cervicomedullary junction and pituitary are within normal limits. Chronic scattered cerebral white matter T2 and FLAIR hyperintensity has not significantly changed since 2006, and remains in a nonspecific configuration. No cortical encephalomalacia or chronic cerebral blood products identified. Unchanged small subtle area of T2 hyperintensity in the right cerebellar hemisphere on series 7, image 6 today, significance doubtful. Otherwise the cerebellum, brainstem, and deep gray matter nuclei are within normal limits. Vascular: Major intracranial vascular flow voids are stable since 2006 and appear normal. Skull and upper cervical spine: Negative. Sinuses/Orbits: Negative orbit soft tissues. Visualized paranasal sinuses and mastoids are stable and well pneumatized. Other: Visible internal auditory structures appear grossly stable and normal. Negative scalp soft tissues. IMPRESSION: 1.  No acute intracranial abnormality. 2. Stable noncontrast MRI appearance of the brain since 2006, including nonspecific chronic cerebral white matter signal changes. Electronically Signed   By: Genevie Ann M.D.   On: 04/25/2016 13:48    1440:  Workup reassuring. Pt given antivert with improvement of symptoms. Pt is not orthostatic on VS. Pt has ambulated with steady gait, easy resps, NAD. Pt has tol PO well while in the ED without N/V. Pt states she feels better and wants to go home now.  Continue to tx symptomatically, f/u PMD. Dx and testing d/w pt and family.  Questions answered.  Verb understanding, agreeable to d/c home with outpt f/u.      Final Clinical Impressions(s) / ED Diagnoses   Final diagnoses:  None    New Prescriptions New Prescriptions   No medications on file     Francine Graven, DO 04/30/16 1707

## 2016-04-25 NOTE — ED Notes (Signed)
PT Ambulated fine with no dizziness

## 2016-04-25 NOTE — Progress Notes (Signed)
   Subjective:    Patient ID: [name], female    DOB: 11-07-1950, 66 y.o.   MRN: [id number]  Dizziness  This is a new problem. Episode onset: 3 days. Associated symptoms include headaches. The symptoms are aggravated by walking and standing. She has tried nothing for the symptoms.   This patient states that it started off when she bent down and turn off the gas stove on Saturday but then afterwards she felt unsteady in describes the dizziness. Her definition of dizziness is feeling at times like her head is spinning but other times unsteady he denies unilateral numbness or weakness. She states over the past days she has not been able to read the newspaper when she typically is able to read it. She also relates significant headache that's been present over the past day or 2 no nausea or vomiting with it.  She has history diabetes hypertension   Review of Systems  Neurological: Positive for dizziness and headaches.  She denies unilateral numbness weakness denies chest tightness pressure pain     Objective:   Physical Exam Lungs are clear hearts regular blood pressure elevated on recheck extremities no edema No obvious nystagmus noted patient having difficult time following an object with her extraocular muscles. Her peripheral vision is very poor she is unable to see objects unless they're in the central portion of her vision  Her Romberg she wavers and had to be held onto to prevent falling. She walks with a very unsteady balance       Assessment & Plan:  Although she does have some symptoms of inner ear She has risk factors for stroke plus also the visual deficits combined with elevated blood pressure headache and significant ataxia indicates that there is a possibility of stroke related issues. The patient was sent to the emergency department for further evaluation for stroke. ER doctor was spoken with.

## 2016-04-30 ENCOUNTER — Telehealth: Payer: Self-pay | Admitting: Family Medicine

## 2016-04-30 DIAGNOSIS — R42 Dizziness and giddiness: Secondary | ICD-10-CM

## 2016-04-30 NOTE — Telephone Encounter (Signed)
Pt was seen here for vertigo/severe dizziness. I was concerned that this could be a stroke so therefore she was sent to ER they did MRI (which was ok) and sent her home on meclizine- Nurses- please see how she is doing now and if she needs anything else( she is scheduled to come on Friday for 6 month med check ) call her and see how she is doing

## 2016-05-01 ENCOUNTER — Encounter: Payer: Self-pay | Admitting: Family Medicine

## 2016-05-01 MED ORDER — METFORMIN HCL 500 MG PO TABS: ORAL_TABLET | ORAL | 5 refills | Status: DC

## 2016-05-01 NOTE — Telephone Encounter (Signed)
Spoke with patient and informed her per Dr.Scott Luking- So the next step in this as we need to get you in with ear nose throat for further evaluation-urgent referral ENT also you should keep your  appointment later this week with [place]. Patient verbalized understanding.

## 2016-05-01 NOTE — Addendum Note (Signed)
Addended by: Ofilia Neas R on: 05/01/2016 10:17 AM   Modules accepted: Orders

## 2016-05-01 NOTE — Telephone Encounter (Signed)
So the next step in this as we need to get her in with ear nose throat for further evaluation-urgent referral ENT please talk with the patient make sure she knows we are doing this plus also she should keep her appointment later this week with [place]

## 2016-05-01 NOTE — Telephone Encounter (Signed)
Spoke with the patient and she stated that she is not doing any better. She stated that her head is still swimming and she is having difficulty eating due to being dizzy when she raises her head up. States she has taken Meclizine and it is not helping. Please advise?

## 2016-05-01 NOTE — Addendum Note (Signed)
Addended by: Launa Grill on: 05/01/2016 08:07 AM   Modules accepted: Orders

## 2016-05-03 DIAGNOSIS — E785 Hyperlipidemia, unspecified: Secondary | ICD-10-CM | POA: Diagnosis not present

## 2016-05-04 LAB — HEPATIC FUNCTION PANEL
ALT: 20 IU/L (ref 0–32)
AST: 21 IU/L (ref 0–40)
Albumin: 3.9 g/dL (ref 3.6–4.8)
Alkaline Phosphatase: 99 IU/L (ref 39–117)
BILIRUBIN TOTAL: 0.5 mg/dL (ref 0.0–1.2)
BILIRUBIN, DIRECT: 0.12 mg/dL (ref 0.00–0.40)
Total Protein: 7 g/dL (ref 6.0–8.5)

## 2016-05-04 LAB — LIPID PANEL
CHOL/HDL RATIO: 3.4 ratio (ref 0.0–4.4)
CHOLESTEROL TOTAL: 160 mg/dL (ref 100–199)
HDL: 47 mg/dL (ref >39)
LDL CALC: 98 mg/dL (ref 0–99)
TRIGLYCERIDES: 75 mg/dL (ref 0–149)
VLDL Cholesterol Cal: 15 mg/dL (ref 5–40)

## 2016-05-05 ENCOUNTER — Encounter: Payer: Self-pay | Admitting: Family Medicine

## 2016-05-05 ENCOUNTER — Ambulatory Visit (INDEPENDENT_AMBULATORY_CARE_PROVIDER_SITE_OTHER): Payer: Medicare Other | Admitting: Family Medicine

## 2016-05-05 VITALS — BP 130/84 | Ht 59.0 in | Wt 204.1 lb

## 2016-05-05 DIAGNOSIS — E119 Type 2 diabetes mellitus without complications: Secondary | ICD-10-CM

## 2016-05-05 DIAGNOSIS — E785 Hyperlipidemia, unspecified: Secondary | ICD-10-CM

## 2016-05-05 DIAGNOSIS — I10 Essential (primary) hypertension: Secondary | ICD-10-CM | POA: Diagnosis not present

## 2016-05-05 DIAGNOSIS — R42 Dizziness and giddiness: Secondary | ICD-10-CM | POA: Diagnosis not present

## 2016-05-05 LAB — POCT GLYCOSYLATED HEMOGLOBIN (HGB A1C): HEMOGLOBIN A1C: 5.3

## 2016-05-05 MED ORDER — ENALAPRIL-HYDROCHLOROTHIAZIDE 10-25 MG PO TABS: 1.0000 | ORAL_TABLET | Freq: Every day | ORAL | 5 refills | Status: DC

## 2016-05-05 MED ORDER — ATORVASTATIN CALCIUM 40 MG PO TABS: 40.0000 mg | ORAL_TABLET | Freq: Every day | ORAL | 6 refills | Status: DC

## 2016-05-05 MED ORDER — METFORMIN HCL 500 MG PO TABS: ORAL_TABLET | ORAL | 5 refills | Status: DC

## 2016-05-05 NOTE — Addendum Note (Signed)
Addended by: Launa Grill on: 05/05/2016 03:42 PM   Modules accepted: Orders

## 2016-05-05 NOTE — Progress Notes (Signed)
   Subjective:    Patient ID: [name], female    DOB: 1951/02/22, 66 y.o.   MRN: [id number]  Diabetes  She presents for her follow-up diabetic visit. She has type 2 diabetes mellitus. Hypoglycemia symptoms include dizziness. Pertinent negatives for hypoglycemia include no confusion or headaches. There are no diabetic associated symptoms. Pertinent negatives for diabetes include no chest pain, no fatigue, no polydipsia, no polyphagia and no weakness. There are no hypoglycemic complications. There are no diabetic complications. There are no known risk factors for coronary artery disease. Current diabetic treatment includes oral agent (monotherapy). She is compliant with treatment all of the time.  Patient has not had a diabetic eye exam.  Patient still has some issues with vertigo.  Results for orders placed or performed in visit on 05/05/16  POCT glycosylated hemoglobin (Hb A1C)  Result Value Ref Range   Hemoglobin A1C 5.3    Vertigo seems less less dizzy no severe headaches no vomiting no diarrhea  Trying to lose weight try to watch her diet and trying to be a little more active although has not gotten into walking much yet Takes her blood pressure medicine watches her diet tries to minimize salt in the diet  Takes her cholesterol medicine watches diet previous labs reviewed with patient  Morbid obesity patient understands importance of trying to lose weight through diet watching portions   Review of Systems  Constitutional: Negative for activity change, appetite change and fatigue.  HENT: Negative for congestion.   Respiratory: Negative for cough.   Cardiovascular: Negative for chest pain.  Gastrointestinal: Negative for abdominal pain.  Endocrine: Negative for polydipsia and polyphagia.  Musculoskeletal: Negative for arthralgias, back pain and gait problem.  Neurological: Positive for dizziness. Negative for weakness, light-headedness, numbness and headaches.    Psychiatric/Behavioral: Negative for confusion.       Objective:   Physical Exam  Constitutional: She appears well-nourished. No distress.  Cardiovascular: Normal rate, regular rhythm and normal heart sounds.   No murmur heard. Pulmonary/Chest: Effort normal and breath sounds normal. No respiratory distress.  Musculoskeletal: She exhibits no edema.  Lymphadenopathy:    She has no cervical adenopathy.  Neurological: She is alert. She exhibits normal muscle tone.  Psychiatric: Her behavior is normal.  Vitals reviewed.         Assessment & Plan:  Diabetes good control watch diet closely-May reduce metformin to just one per day follow-up again in 5-6 months  HTN continue current medicine minimize salt stay active do a lot of walking try to lose weight  Hyperlipidemia previous labs reviewed continue medication watch diet closely  Morbid obesity the importance of losing weight to lessen the risk of heart disease was discussed patient to continue try to lose weight follow-up 6 months  Vertigo/dizziness will see ENT in May if patient has worsening symptoms follow-up with [place] sooner if symptoms correct themselves then we can stop the referral

## 2016-05-09 ENCOUNTER — Other Ambulatory Visit: Payer: Self-pay | Admitting: Family Medicine

## 2016-05-09 DIAGNOSIS — E785 Hyperlipidemia, unspecified: Secondary | ICD-10-CM

## 2016-06-08 DIAGNOSIS — E119 Type 2 diabetes mellitus without complications: Secondary | ICD-10-CM | POA: Diagnosis not present

## 2016-06-08 DIAGNOSIS — H524 Presbyopia: Secondary | ICD-10-CM | POA: Diagnosis not present

## 2016-06-08 DIAGNOSIS — H5203 Hypermetropia, bilateral: Secondary | ICD-10-CM | POA: Diagnosis not present

## 2016-06-08 DIAGNOSIS — H52223 Regular astigmatism, bilateral: Secondary | ICD-10-CM | POA: Diagnosis not present

## 2016-06-08 LAB — HM DIABETES EYE EXAM

## 2016-06-22 ENCOUNTER — Encounter: Payer: Self-pay | Admitting: *Deleted

## 2016-10-09 ENCOUNTER — Telehealth: Payer: Self-pay | Admitting: Family Medicine

## 2016-10-09 DIAGNOSIS — Z1159 Encounter for screening for other viral diseases: Secondary | ICD-10-CM

## 2016-10-09 DIAGNOSIS — I10 Essential (primary) hypertension: Secondary | ICD-10-CM

## 2016-10-09 DIAGNOSIS — E782 Mixed hyperlipidemia: Secondary | ICD-10-CM

## 2016-10-09 DIAGNOSIS — E119 Type 2 diabetes mellitus without complications: Secondary | ICD-10-CM

## 2016-10-09 NOTE — Telephone Encounter (Signed)
Hepatitis C antibody along with lipid liver A1c, met 74 somewhere in September October at the latest with follow-up office visit this fall. Also inquire with the patient regarding when was her last colonoscopy

## 2016-10-09 NOTE — Telephone Encounter (Signed)
Patient received a call from the [facility] today through [facility] reminding her to get certain tests done that she may be due for.  She wants to know if she is due for anything?

## 2016-10-10 NOTE — Telephone Encounter (Signed)
Patient stated she has not scheduled her colonoscopy yet as advised at her physical. Patient has appointment scheduled for her regular check up in September. Blood work ordered in [employer]

## 2016-10-10 NOTE — Telephone Encounter (Signed)
Left message to return call 

## 2016-10-24 DIAGNOSIS — M1711 Unilateral primary osteoarthritis, right knee: Secondary | ICD-10-CM | POA: Diagnosis not present

## 2016-11-01 DIAGNOSIS — I10 Essential (primary) hypertension: Secondary | ICD-10-CM | POA: Diagnosis not present

## 2016-11-01 DIAGNOSIS — Z1159 Encounter for screening for other viral diseases: Secondary | ICD-10-CM | POA: Diagnosis not present

## 2016-11-01 DIAGNOSIS — E782 Mixed hyperlipidemia: Secondary | ICD-10-CM | POA: Diagnosis not present

## 2016-11-01 DIAGNOSIS — E119 Type 2 diabetes mellitus without complications: Secondary | ICD-10-CM | POA: Diagnosis not present

## 2016-11-02 LAB — LIPID PANEL
CHOL/HDL RATIO: 2.9 ratio (ref 0.0–4.4)
Cholesterol, Total: 142 mg/dL (ref 100–199)
HDL: 49 mg/dL (ref >39)
LDL Calculated: 82 mg/dL (ref 0–99)
TRIGLYCERIDES: 53 mg/dL (ref 0–149)
VLDL Cholesterol Cal: 11 mg/dL (ref 5–40)

## 2016-11-02 LAB — BASIC METABOLIC PANEL
BUN/Creatinine Ratio: 26 (ref 12–28)
BUN: 23 mg/dL (ref 8–27)
CO2: 25 mmol/L (ref 20–29)
CREATININE: 0.87 mg/dL (ref 0.57–1.00)
Calcium: 9.6 mg/dL (ref 8.7–10.3)
Chloride: 102 mmol/L (ref 96–106)
GFR calc Af Amer: 80 mL/min/{1.73_m2} (ref >59)
GFR, EST NON AFRICAN AMERICAN: 69 mL/min/{1.73_m2} (ref >59)
Glucose: 113 mg/dL — ABNORMAL HIGH (ref 65–99)
Potassium: 4.2 mmol/L (ref 3.5–5.2)
SODIUM: 142 mmol/L (ref 134–144)

## 2016-11-02 LAB — HEPATITIS C ANTIBODY: Hep C Virus Ab: 0.1 s/co ratio (ref 0.0–0.9)

## 2016-11-02 LAB — HEMOGLOBIN A1C
Est. average glucose Bld gHb Est-mCnc: 148 mg/dL
Hgb A1c MFr Bld: 6.8 % — ABNORMAL HIGH (ref 4.8–5.6)

## 2016-11-02 LAB — HEPATIC FUNCTION PANEL
ALK PHOS: 114 IU/L (ref 39–117)
ALT: 22 IU/L (ref 0–32)
AST: 17 IU/L (ref 0–40)
Albumin: 4.2 g/dL (ref 3.6–4.8)
BILIRUBIN, DIRECT: 0.14 mg/dL (ref 0.00–0.40)
Bilirubin Total: 0.5 mg/dL (ref 0.0–1.2)
Total Protein: 7 g/dL (ref 6.0–8.5)

## 2016-11-06 ENCOUNTER — Encounter: Payer: Self-pay | Admitting: Family Medicine

## 2016-11-06 ENCOUNTER — Ambulatory Visit (INDEPENDENT_AMBULATORY_CARE_PROVIDER_SITE_OTHER): Payer: Medicare Other | Admitting: Family Medicine

## 2016-11-06 VITALS — BP 124/76 | Ht 59.0 in | Wt 212.0 lb

## 2016-11-06 DIAGNOSIS — R350 Frequency of micturition: Secondary | ICD-10-CM | POA: Diagnosis not present

## 2016-11-06 DIAGNOSIS — E119 Type 2 diabetes mellitus without complications: Secondary | ICD-10-CM

## 2016-11-06 DIAGNOSIS — E785 Hyperlipidemia, unspecified: Secondary | ICD-10-CM

## 2016-11-06 DIAGNOSIS — I10 Essential (primary) hypertension: Secondary | ICD-10-CM

## 2016-11-06 MED ORDER — ZOSTER VAC RECOMB ADJUVANTED 50 MCG/0.5ML IM SUSR: 0.5000 mL | Freq: Once | INTRAMUSCULAR | 1 refills | Status: AC

## 2016-11-06 MED ORDER — ATORVASTATIN CALCIUM 40 MG PO TABS: 40.0000 mg | ORAL_TABLET | Freq: Every day | ORAL | 5 refills | Status: DC

## 2016-11-06 MED ORDER — METFORMIN HCL 500 MG PO TABS: ORAL_TABLET | ORAL | 5 refills | Status: DC

## 2016-11-06 MED ORDER — ENALAPRIL-HYDROCHLOROTHIAZIDE 10-25 MG PO TABS: 1.0000 | ORAL_TABLET | Freq: Every day | ORAL | 5 refills | Status: DC

## 2016-11-06 NOTE — Progress Notes (Signed)
   Subjective:    Patient ID: [name], female    DOB: September 15, 1950, 66 y.o.   MRN: [id number]  Diabetes  She presents for her follow-up diabetic visit. She has type 2 diabetes mellitus. Pertinent negatives for hypoglycemia include no confusion. Pertinent negatives for diabetes include no chest pain, no fatigue, no polydipsia, no polyphagia and no weakness. She never (not exercising because knees have been hurting) participates in exercise. Home blood sugar record trend: does not check blood sugar. She does not see a podiatrist.Eye exam is current.        A1C done on bloodwork. 6.8. Hyperlipidemia decent control patient denies any noncompliance tries watch diet Morbid obesity patient trying to watch calories not exercising Right ankle swelling.    She feels at the right ankle is swollen compared to how it usually is she denies any swelling in the calf or knee She does have urinary frequency gets her up 2 or 39 she states she has difficult time holding her urine finding at times she feels like she needs a more often Blood pressure under good control with medication watch his diet some degree                                                          Review of Systems  Constitutional: Negative for activity change, appetite change and fatigue.  HENT: Negative for congestion.   Respiratory: Negative for cough.   Cardiovascular: Negative for chest pain.  Gastrointestinal: Negative for abdominal pain.  Endocrine: Negative for polydipsia and polyphagia.  Musculoskeletal: Positive for arthralgias.  Skin: Negative for color change.  Neurological: Negative for weakness.  Psychiatric/Behavioral: Negative for confusion.       Objective:   Physical Exam  Constitutional: She appears well-developed and well-nourished. No distress.  HENT:  Head: Normocephalic and atraumatic.  Eyes: Right eye exhibits no discharge. Left eye exhibits no discharge.  Neck: No tracheal deviation present.    Cardiovascular: Normal rate, regular rhythm and normal heart sounds.   No murmur heard. Pulmonary/Chest: Effort normal and breath sounds normal. No respiratory distress. She has no wheezes. She has no rales.  Musculoskeletal: She exhibits no edema.  Lymphadenopathy:    She has no cervical adenopathy.  Neurological: She is alert. She exhibits normal muscle tone.  Skin: Skin is warm and dry. No erythema.  Psychiatric: Her behavior is normal.  Vitals reviewed.   The right ankle has adipose tissue but no discernible swelling      Assessment & Plan:  Right ankle I believe this is more adipose tissue rather than any type of edema Blood pressure good control continue current medications watch diet Morbid obesity cup portions be more active try to lose weight Diabetes A1c is gone up metformin she is currently only taking half tablet daily because of GI side effects we will try half tablet twice daily Hyperlipidemia labs reviewed continue medication

## 2016-11-08 ENCOUNTER — Encounter: Payer: Self-pay | Admitting: Family Medicine

## 2016-12-26 ENCOUNTER — Other Ambulatory Visit: Payer: Self-pay | Admitting: *Deleted

## 2016-12-26 DIAGNOSIS — E785 Hyperlipidemia, unspecified: Secondary | ICD-10-CM

## 2016-12-26 MED ORDER — ATORVASTATIN CALCIUM 40 MG PO TABS: 40.0000 mg | ORAL_TABLET | Freq: Every day | ORAL | 0 refills | Status: DC

## 2017-01-03 ENCOUNTER — Ambulatory Visit (INDEPENDENT_AMBULATORY_CARE_PROVIDER_SITE_OTHER): Payer: Medicare Other

## 2017-01-03 DIAGNOSIS — Z23 Encounter for immunization: Secondary | ICD-10-CM | POA: Diagnosis not present

## 2017-01-23 ENCOUNTER — Ambulatory Visit (INDEPENDENT_AMBULATORY_CARE_PROVIDER_SITE_OTHER): Payer: Medicare Other | Admitting: Urology

## 2017-01-23 DIAGNOSIS — R3915 Urgency of urination: Secondary | ICD-10-CM | POA: Diagnosis not present

## 2017-01-26 ENCOUNTER — Other Ambulatory Visit: Payer: Self-pay | Admitting: Family Medicine

## 2017-01-26 DIAGNOSIS — Z1231 Encounter for screening mammogram for malignant neoplasm of breast: Secondary | ICD-10-CM

## 2017-02-01 ENCOUNTER — Ambulatory Visit (HOSPITAL_COMMUNITY)
Admission: RE | Admit: 2017-02-01 | Discharge: 2017-02-01 | Disposition: A | Discharge status: Home / Self Care | Payer: Medicare Other | Source: Ambulatory Visit | Attending: Family Medicine | Admitting: Family Medicine

## 2017-02-01 ENCOUNTER — Encounter (HOSPITAL_COMMUNITY): Payer: Self-pay

## 2017-02-01 ENCOUNTER — Other Ambulatory Visit: Payer: Self-pay

## 2017-02-01 DIAGNOSIS — Z1231 Encounter for screening mammogram for malignant neoplasm of breast: Secondary | ICD-10-CM | POA: Insufficient documentation

## 2017-02-13 DIAGNOSIS — K1379 Other lesions of oral mucosa: Secondary | ICD-10-CM | POA: Diagnosis not present

## 2017-05-03 ENCOUNTER — Telehealth: Payer: Self-pay | Admitting: Family Medicine

## 2017-05-03 DIAGNOSIS — E119 Type 2 diabetes mellitus without complications: Secondary | ICD-10-CM

## 2017-05-03 DIAGNOSIS — E782 Mixed hyperlipidemia: Secondary | ICD-10-CM

## 2017-05-03 DIAGNOSIS — I10 Essential (primary) hypertension: Secondary | ICD-10-CM

## 2017-05-03 NOTE — Telephone Encounter (Signed)
Blood work ordered in Epic. Patient notified. 

## 2017-05-03 NOTE — Telephone Encounter (Signed)
Patient is requesting orders for labs to have done tomorrow morning.  She has an appointment on 05/07/17 with Dr. Nicki Reaper.

## 2017-05-03 NOTE — Telephone Encounter (Signed)
Lipid, liver, metabolic 7, A1c, urine ACR °

## 2017-05-03 NOTE — Telephone Encounter (Signed)
Patient had last labs 10/2016- Lipid, liver, HgbA1c, met 7, Hep C

## 2017-05-04 DIAGNOSIS — E119 Type 2 diabetes mellitus without complications: Secondary | ICD-10-CM | POA: Diagnosis not present

## 2017-05-04 DIAGNOSIS — I10 Essential (primary) hypertension: Secondary | ICD-10-CM | POA: Diagnosis not present

## 2017-05-04 DIAGNOSIS — E782 Mixed hyperlipidemia: Secondary | ICD-10-CM | POA: Diagnosis not present

## 2017-05-05 LAB — BASIC METABOLIC PANEL
BUN/Creatinine Ratio: 24 (ref 12–28)
BUN: 17 mg/dL (ref 8–27)
CALCIUM: 9 mg/dL (ref 8.7–10.3)
CHLORIDE: 103 mmol/L (ref 96–106)
CO2: 24 mmol/L (ref 20–29)
Creatinine, Ser: 0.71 mg/dL (ref 0.57–1.00)
GFR calc Af Amer: 102 mL/min/{1.73_m2} (ref >59)
GFR calc non Af Amer: 88 mL/min/{1.73_m2} (ref >59)
GLUCOSE: 120 mg/dL — AB (ref 65–99)
POTASSIUM: 3.7 mmol/L (ref 3.5–5.2)
SODIUM: 140 mmol/L (ref 134–144)

## 2017-05-05 LAB — HEPATIC FUNCTION PANEL
ALK PHOS: 89 IU/L (ref 39–117)
ALT: 16 IU/L (ref 0–32)
AST: 17 IU/L (ref 0–40)
Albumin: 3.8 g/dL (ref 3.6–4.8)
Bilirubin Total: 0.4 mg/dL (ref 0.0–1.2)
Bilirubin, Direct: 0.14 mg/dL (ref 0.00–0.40)
Total Protein: 6.6 g/dL (ref 6.0–8.5)

## 2017-05-05 LAB — LIPID PANEL
CHOLESTEROL TOTAL: 153 mg/dL (ref 100–199)
Chol/HDL Ratio: 3.9 ratio (ref 0.0–4.4)
HDL: 39 mg/dL — AB (ref >39)
LDL CALC: 94 mg/dL (ref 0–99)
TRIGLYCERIDES: 101 mg/dL (ref 0–149)
VLDL CHOLESTEROL CAL: 20 mg/dL (ref 5–40)

## 2017-05-05 LAB — HEMOGLOBIN A1C
ESTIMATED AVERAGE GLUCOSE: 151 mg/dL
Hgb A1c MFr Bld: 6.9 % — ABNORMAL HIGH (ref 4.8–5.6)

## 2017-05-05 LAB — MICROALBUMIN / CREATININE URINE RATIO
CREATININE, UR: 105.1 mg/dL
Microalb/Creat Ratio: 10.6 mg/g creat (ref 0.0–30.0)
Microalbumin, Urine: 11.1 ug/mL

## 2017-05-07 ENCOUNTER — Encounter: Payer: Self-pay | Admitting: Family Medicine

## 2017-05-07 ENCOUNTER — Ambulatory Visit (INDEPENDENT_AMBULATORY_CARE_PROVIDER_SITE_OTHER): Payer: Medicare Other | Admitting: Family Medicine

## 2017-05-07 VITALS — BP 124/78 | Ht 59.0 in | Wt 217.8 lb

## 2017-05-07 DIAGNOSIS — Z1211 Encounter for screening for malignant neoplasm of colon: Secondary | ICD-10-CM

## 2017-05-07 DIAGNOSIS — E119 Type 2 diabetes mellitus without complications: Secondary | ICD-10-CM

## 2017-05-07 DIAGNOSIS — I10 Essential (primary) hypertension: Secondary | ICD-10-CM | POA: Diagnosis not present

## 2017-05-07 DIAGNOSIS — E782 Mixed hyperlipidemia: Secondary | ICD-10-CM | POA: Diagnosis not present

## 2017-05-07 DIAGNOSIS — E785 Hyperlipidemia, unspecified: Secondary | ICD-10-CM

## 2017-05-07 MED ORDER — ATORVASTATIN CALCIUM 80 MG PO TABS: 80.0000 mg | ORAL_TABLET | Freq: Every day | ORAL | 6 refills | Status: DC

## 2017-05-07 MED ORDER — ENALAPRIL-HYDROCHLOROTHIAZIDE 10-25 MG PO TABS: 1.0000 | ORAL_TABLET | Freq: Every day | ORAL | 5 refills | Status: DC

## 2017-05-07 MED ORDER — METFORMIN HCL 500 MG PO TABS: ORAL_TABLET | ORAL | 5 refills | Status: DC

## 2017-05-07 NOTE — Progress Notes (Addendum)
Subjective:    Patient ID: [name], female    DOB: 14-Sep-1950, 67 y.o.   MRN: [id number]  Diabetes  She presents for her follow-up diabetic visit. She has type 2 diabetes mellitus. Pertinent negatives for hypoglycemia include no confusion, headaches, pallor or tremors. Pertinent negatives for diabetes include no chest pain, no fatigue, no polydipsia, no polyphagia and no weakness. Risk factors for coronary artery disease include diabetes mellitus, hypertension and post-menopausal. Current diabetic treatment includes oral agent (monotherapy). She is compliant with treatment all of the time. Her weight is stable. She is following a diabetic diet.   Patient here for follow-up regarding cholesterol.  Patient does try to maintain a reasonable diet.  Patient does take the medication on a regular basis.  Denies missing a dose.  The patient denies any obvious side effects.  Prior blood work results reviewed with the patient.  The patient is aware of his cholesterol goals and the need to keep it under good control to lessen the risk of disease. Patient is not been doing a good job watching her diet not exercising has been gaining some weight  The patient's BMI is calculated.  It is in the vital signs and acknowledged.  It is above the recommended BMI for the patient's height and weight.  The patient has been counseled regarding healthy diet, restricted portions, avoiding excessive carbohydrates/sugary foods, and increase physical activity as health permits.  It is in the patient's best interest to lower the risk of secondary illness including heart disease strokes and cancer by losing weight.  The patient acknowledges this information.   Patient also needs colon cancer screening this was discussed in detail Results for orders placed or performed in visit on 05/03/17  Lipid panel  Result Value Ref Range   Cholesterol, Total 153 100 - 199 mg/dL   Triglycerides 101 0 - 149 mg/dL   HDL 39 (L) >39  mg/dL   VLDL Cholesterol Cal 20 5 - 40 mg/dL   LDL Calculated 94 0 - 99 mg/dL   Chol/HDL Ratio 3.9 0.0 - 4.4 ratio  Hepatic function panel  Result Value Ref Range   Total Protein 6.6 6.0 - 8.5 g/dL   Albumin 3.8 3.6 - 4.8 g/dL   Bilirubin Total 0.4 0.0 - 1.2 mg/dL   Bilirubin, Direct 0.14 0.00 - 0.40 mg/dL   Alkaline Phosphatase 89 39 - 117 IU/L   AST 17 0 - 40 IU/L   ALT 16 0 - 32 IU/L  Basic metabolic panel  Result Value Ref Range   Glucose 120 (H) 65 - 99 mg/dL   BUN 17 8 - 27 mg/dL   Creatinine, Ser 0.71 0.57 - 1.00 mg/dL   GFR calc non Af Amer 88 >59 mL/min/1.73   GFR calc Af Amer 102 >59 mL/min/1.73   BUN/Creatinine Ratio 24 12 - 28   Sodium 140 134 - 144 mmol/L   Potassium 3.7 3.5 - 5.2 mmol/L   Chloride 103 96 - 106 mmol/L   CO2 24 20 - 29 mmol/L   Calcium 9.0 8.7 - 10.3 mg/dL  Hemoglobin A1c  Result Value Ref Range   Hgb A1c MFr Bld 6.9 (H) 4.8 - 5.6 %   Est. average glucose Bld gHb Est-mCnc 151 mg/dL  Microalbumin / creatinine urine ratio  Result Value Ref Range   Creatinine, Urine 105.1 Not Estab. mg/dL   Microalbumin, Urine 11.1 Not Estab. ug/mL   Microalb/Creat Ratio 10.6 0.0 - 30.0 mg/g creat  Review of Systems  Constitutional: Negative for activity change, appetite change and fatigue.  HENT: Negative for congestion and rhinorrhea.   Respiratory: Negative for cough and shortness of breath.   Cardiovascular: Negative for chest pain and leg swelling.  Gastrointestinal: Negative for abdominal pain, constipation, diarrhea and nausea.  Endocrine: Negative for polydipsia and polyphagia.  Skin: Negative for color change and pallor.  Neurological: Negative for tremors, weakness and headaches.  Psychiatric/Behavioral: Negative for confusion and decreased concentration.       Objective:   Physical Exam  Constitutional: She appears well-developed and well-nourished. No distress.  HENT:  Head: Normocephalic and atraumatic.  Eyes: Right eye exhibits no  discharge. Left eye exhibits no discharge.  Neck: No tracheal deviation present.  Cardiovascular: Normal rate, regular rhythm and normal heart sounds.  No murmur heard. Pulmonary/Chest: Effort normal and breath sounds normal. No respiratory distress. She has no wheezes. She has no rales.  Musculoskeletal: She exhibits no edema.  Lymphadenopathy:    She has no cervical adenopathy.  Neurological: She is alert. She exhibits normal muscle tone.  Skin: Skin is warm and dry. No erythema.  Psychiatric: Her behavior is normal.  Vitals reviewed.   25 minutes was spent with the patient.  This statement verifies that 25 minutes was indeed spent with the patient. Greater than half the time was spent in discussion, counseling and answering questions  regarding the issues that the patient came in for today as reflected in the diagnosis (s) please refer to documentation for further details. Discussion today regarding importance of getting cholesterol under better control also the importance of diabetes and avoiding low sugar spells plus also blood pressure and follow-up in a healthier diet and losing weight      Assessment & Plan:  1. Essential hypertension, benign Blood pressure elevated initially,DASHdiet recommended, importance of losing weight recommended also increase exercise blood pressure follow-up looked better  2. Type 2 diabetes mellitus without complication, without long-term current use of insulin (HCC) A1c under decent control continue medication watch diet minimize starches  3. Mixed hyperlipidemia Minimize fats, LDL not under good control, increase medication 80 mg daily, recheck labs in 6 months  4. Hyperlipidemia, unspecified hyperlipidemia type Please see above - atorvastatin (LIPITOR) 80 MG tablet; Take 1 tablet (80 mg total) by mouth daily.  Dispense: 30 tablet; Refill: 6  5. Colon cancer screening Screening-patient is hoping to have a smaller volume prep so she can tolerate  it better she did not tolerate the larger volume  - Ambulatory referral to [place] [facility]  Morbid obesity, watch diet, exercise activity, lose weight The patient's BMI is calculated.  It is in the vital signs and acknowledged.  It is above the recommended BMI for the patient's height and weight.  The patient has been counseled regarding healthy diet, restricted portions, avoiding excessive carbohydrates/sugary foods, and increase physical activity as health permits.  It is in the patient's best interest to lower the risk of secondary illness including heart disease strokes and cancer by losing weight.  The patient acknowledges this information.  Patient's weight is going up the importance of exercising portion control and losing weight was discussed in detail  Follow-up 6 months

## 2017-05-10 ENCOUNTER — Ambulatory Visit (INDEPENDENT_AMBULATORY_CARE_PROVIDER_SITE_OTHER): Payer: Medicare Other | Admitting: Otolaryngology

## 2017-05-10 DIAGNOSIS — H9312 Tinnitus, left ear: Secondary | ICD-10-CM | POA: Diagnosis not present

## 2017-05-21 ENCOUNTER — Ambulatory Visit (INDEPENDENT_AMBULATORY_CARE_PROVIDER_SITE_OTHER): Payer: Self-pay

## 2017-05-21 DIAGNOSIS — Z1211 Encounter for screening for malignant neoplasm of colon: Secondary | ICD-10-CM

## 2017-05-21 MED ORDER — NA SULFATE-K SULFATE-MG SULF 17.5-3.13-1.6 GM/177ML PO SOLN: 1.0000 | ORAL | 0 refills | Status: DC

## 2017-05-21 NOTE — Progress Notes (Signed)
Gastroenterology Pre-Procedure Review Request Date:05/21/17 Requesting Physician: Dr.Luking ( last tcs 05/30/02 RMR- no polyps- 10 year repeat- pt stated she never came back to have it done d/t the fact she didn't like the prep and she wasn't drinking that whole gallon again. I have given her suprep and went over all the instructions with her.)  PATIENT REVIEW QUESTIONS: The patient responded to the following health history questions as indicated:    1. Diabetes Melitis: yes (metformin bid) 2. Joint replacements in the past 12 months: no 3. Major health problems in the past 3 months: no 4. Has an artificial valve or MVP: no 5. Has a defibrillator: no 6. Has been advised in past to take antibiotics in advance of a procedure like teeth cleaning: no 7. Family history of colon cancer: no  8. Alcohol Use: no 9. History of sleep apnea: no  10. History of coronary artery or other vascular stents placed within the last 12 months: no 11. History of any prior anesthesia complications: no    MEDICATIONS & ALLERGIES:    Patient reports the following regarding taking any blood thinners:   Plavix? no Aspirin? no Coumadin? no Brilinta? no Xarelto? no Eliquis? no Pradaxa? no Savaysa? no Effient? no  Patient confirms/reports the following medications:  Current Outpatient Medications  Medication Sig Dispense Refill  . atorvastatin (LIPITOR) 80 MG tablet Take 1 tablet (80 mg total) by mouth daily. 30 tablet 6  . enalapril-hydrochlorothiazide (VASERETIC) 10-25 MG tablet Take 1 tablet by mouth daily. 30 tablet 5  . meclizine (ANTIVERT) 25 MG tablet Take 1 tablet (25 mg total) by mouth 3 (three) times daily as needed for dizziness. 15 tablet 0  . metFORMIN (GLUCOPHAGE) 500 MG tablet Take 1/2 bid 30 tablet 5   No current facility-administered medications for this visit.     Patient confirms/reports the following allergies:  No Known Allergies  No orders of the defined types were placed in this  encounter.   AUTHORIZATION INFORMATION Primary Insurance: medicare,  ID #: [id number] Pre-Cert / Auth required: no    SCHEDULE INFORMATION: Procedure has been scheduled as follows:  Date: 07/11/17, Time: 8:30 Location: [facility] Dr.Rourk  This Gastroenterology Pre-Precedure Review Form is being routed to the following provider(s): Walden Field NP

## 2017-05-21 NOTE — Patient Instructions (Signed)
[name]  04/20/1950 MRN: [id number]     Procedure Date: 07/11/17 Time to register: 7:30 Place to register: [facility] Procedure Time: 8:30 Scheduled provider: R. Michael Rourk, MD    PREPARATION FOR COLONOSCOPY WITH SUPREP BOWEL PREP KIT  Note: Suprep Bowel Prep Kit is a split-dose (2day) regimen. Consumption of BOTH 6-ounce bottles is required for a complete prep.  Please notify us immediately if you are diabetic, take iron supplements, or if you are on Coumadin or any other blood thinners.  Please hold the following medications: I will mail you a letter with this information                                                                                                                                                   2 DAYS BEFORE PROCEDURE:  DATE: 07/09/17   DAY: Monday Begin clear liquid diet AFTER your lunch meal. NO SOLID FOODS after this point.  1 DAY BEFORE PROCEDURE:  DATE: 07/10/17  DAY: Tuesday Continue clear liquids the entire day - NO SOLID FOOD.   Diabetic medications adjustments for today: see letter  At 6:00pm: Complete steps 1 through 4 below, using ONE (1) 6-ounce bottle, before going to bed. Step 1:  Pour ONE (1) 6-ounce bottle of SUPREP liquid into the mixing container.  Step 2:  Add cool drinking water to the 16 ounce line on the container and mix.  Note: Dilute the solution concentrate as directed prior to use. Step 3:  DRINK ALL the liquid in the container. Step 4:  You MUST drink an additional two (2) or more 16 ounce containers of water  over the next one (1) hour.   Continue clear liquids only, until midnight. Do not eat or drink anything after midnight. EXCEPTION: If you take medications for your heart, blood pressure, or breathing, you may take these medications with a small amount of clear liquid.   DAY OF PROCEDURE:   DATE: 07/11/17   DAY: Wednesday  Diabetic medications adjustments for today:see letter   5 hours before your  procedure at 3:30am : Step 1:  Pour ONE (1) 6-ounce bottle of SUPREP liquid into the mixing container.  Step 2:  Add cool drinking water to the 16 ounce line on the container and mix.  Note: Dilute the solution concentrate as directed prior to use. Step 3:  DRINK ALL the liquid in the container. Step 4:  You MUST drink an additional two (2) or more 16 ounce containers of water  over the next one (1) hour. You MUST complete the final glass of water at least  3 hours before your colonoscopy.   Nothing by mouth past 5:30am  You may take your morning medications with sip of water unless we have instructed otherwise.    Please see below for Dietary Information.    CLEAR LIQUIDS INCLUDE:  Water Jello (NOT red in color)   Ice Popsicles (NOT red in color)   Tea (sugar ok, no milk/cream) Powdered fruit flavored drinks  Coffee (sugar ok, no milk/cream) Gatorade/ Lemonade/ Kool-Aid  (NOT red in color)   Juice: apple, white grape, white cranberry Soft drinks  Clear bullion, consomme, broth (fat free beef/chicken/vegetable)  Carbonated beverages (any kind)  Strained chicken noodle soup Hard Candy   Remember: Clear liquids are liquids that will allow you to see your fingers on the other side of a clear glass. Be sure liquids are NOT red in color, and not cloudy, but CLEAR.  DO NOT EAT OR DRINK ANY OF THE FOLLOWING:  Dairy products of any kind   Cranberry juice Tomato juice / V8 juice   Grapefruit juice Orange juice     Red grape juice  Do not eat any solid foods, including such foods as: cereal, oatmeal, yogurt, fruits, vegetables, creamed soups, eggs, bread, crackers, pureed foods in a blender, etc.   HELPFUL HINTS FOR DRINKING PREP SOLUTION:   Make sure prep is extremely cold. Mix and refrigerate the the morning of the prep. You may also put in the freezer.   You may try mixing some Crystal Light or Country Time Lemonade if you prefer. Mix in small amounts; add more if necessary.  Try  drinking through a straw  Rinse mouth with water or a mouthwash between glasses, to remove after-taste.  Try sipping on a cold beverage /ice/ popsicles between glasses of prep.  Place a piece of sugar-free hard candy in mouth between glasses.  If you become nauseated, try consuming smaller amounts, or stretch out the time between glasses. Stop for 30-60 minutes, then slowly start back drinking.     OTHER INSTRUCTIONS  You will need a responsible adult at least 68 years of age to accompany you and drive you home. This person must remain in the waiting room during your procedure. The hospital will cancel your procedure if you do not have a responsible adult with you.   1. Wear loose fitting clothing that is easily removed. 2. Leave jewelry and other valuables at home.  3. Remove all body piercing jewelry and leave at home. 4. Total time from sign-in until discharge is approximately 2-3 hours. 5. You should go home directly after your procedure and rest. You can resume normal activities the day after your procedure. 6. The day of your procedure you should not:  Drive  Make legal decisions  Operate machinery  Drink alcohol  Return to work   You may call the office (Dept: [phone]) before 5:00pm, or page the doctor on call [phone]) after 5:00pm, for further instructions, if necessary.   Insurance Information YOU WILL NEED TO CHECK WITH YOUR INSURANCE COMPANY FOR THE BENEFITS OF COVERAGE YOU HAVE FOR THIS PROCEDURE.  UNFORTUNATELY, NOT ALL INSURANCE COMPANIES HAVE BENEFITS TO COVER ALL OR PART OF THESE TYPES OF PROCEDURES.  IT IS YOUR RESPONSIBILITY TO CHECK YOUR BENEFITS, HOWEVER, WE WILL BE GLAD TO ASSIST YOU WITH ANY CODES YOUR INSURANCE COMPANY MAY NEED.    PLEASE NOTE THAT MOST INSURANCE COMPANIES WILL NOT COVER A SCREENING COLONOSCOPY FOR PEOPLE UNDER THE AGE OF 50  IF YOU HAVE BCBS INSURANCE, YOU MAY HAVE BENEFITS FOR A SCREENING COLONOSCOPY BUT IF POLYPS ARE  FOUND THE DIAGNOSIS WILL CHANGE AND THEN YOU MAY HAVE A DEDUCTIBLE THAT WILL NEED TO BE MET. SO PLEASE MAKE SURE YOU CHECK YOUR BENEFITS FOR A  SCREENING COLONOSCOPY AS WELL AS A DIAGNOSTIC COLONOSCOPY.

## 2017-05-22 ENCOUNTER — Ambulatory Visit (INDEPENDENT_AMBULATORY_CARE_PROVIDER_SITE_OTHER): Payer: Medicare Other | Admitting: Urology

## 2017-05-22 DIAGNOSIS — R3915 Urgency of urination: Secondary | ICD-10-CM

## 2017-05-22 DIAGNOSIS — N3281 Overactive bladder: Secondary | ICD-10-CM | POA: Diagnosis not present

## 2017-05-22 NOTE — Progress Notes (Signed)
Ok to schedule.  DM Meds: Half night before, none morning of.  On prep day: Check CBG ac and hs as well as if the patient feels like their blood sugar is off. Can use soda, juice (that's in [place]) as needed for any low blood sugar.  Check CBG on arrival to endo unit.

## 2017-05-23 NOTE — Progress Notes (Signed)
Letter mailed to the pt. 

## 2017-07-11 ENCOUNTER — Other Ambulatory Visit: Payer: Self-pay

## 2017-07-11 ENCOUNTER — Encounter (HOSPITAL_COMMUNITY)
Admission: RE | Disposition: A | Discharge status: Home / Self Care | Payer: Self-pay | Source: Ambulatory Visit | Attending: Internal Medicine

## 2017-07-11 ENCOUNTER — Encounter (HOSPITAL_COMMUNITY): Payer: Self-pay | Admitting: *Deleted

## 2017-07-11 ENCOUNTER — Ambulatory Visit (HOSPITAL_COMMUNITY)
Admission: RE | Admit: 2017-07-11 | Discharge: 2017-07-11 | Disposition: A | Discharge status: Home / Self Care | Payer: Medicare Other | Source: Ambulatory Visit | Attending: Internal Medicine | Admitting: Internal Medicine

## 2017-07-11 DIAGNOSIS — Z87891 Personal history of nicotine dependence: Secondary | ICD-10-CM | POA: Insufficient documentation

## 2017-07-11 DIAGNOSIS — D120 Benign neoplasm of cecum: Secondary | ICD-10-CM | POA: Diagnosis not present

## 2017-07-11 DIAGNOSIS — I4581 Long QT syndrome: Secondary | ICD-10-CM | POA: Diagnosis not present

## 2017-07-11 DIAGNOSIS — Z833 Family history of diabetes mellitus: Secondary | ICD-10-CM | POA: Insufficient documentation

## 2017-07-11 DIAGNOSIS — K635 Polyp of colon: Secondary | ICD-10-CM | POA: Insufficient documentation

## 2017-07-11 DIAGNOSIS — Z1211 Encounter for screening for malignant neoplasm of colon: Secondary | ICD-10-CM | POA: Insufficient documentation

## 2017-07-11 DIAGNOSIS — I10 Essential (primary) hypertension: Secondary | ICD-10-CM | POA: Insufficient documentation

## 2017-07-11 DIAGNOSIS — Z9889 Other specified postprocedural states: Secondary | ICD-10-CM | POA: Insufficient documentation

## 2017-07-11 DIAGNOSIS — E785 Hyperlipidemia, unspecified: Secondary | ICD-10-CM | POA: Insufficient documentation

## 2017-07-11 DIAGNOSIS — Z79899 Other long term (current) drug therapy: Secondary | ICD-10-CM | POA: Insufficient documentation

## 2017-07-11 DIAGNOSIS — Z90710 Acquired absence of both cervix and uterus: Secondary | ICD-10-CM | POA: Insufficient documentation

## 2017-07-11 DIAGNOSIS — Z1212 Encounter for screening for malignant neoplasm of rectum: Secondary | ICD-10-CM | POA: Diagnosis not present

## 2017-07-11 DIAGNOSIS — D125 Benign neoplasm of sigmoid colon: Secondary | ICD-10-CM | POA: Diagnosis not present

## 2017-07-11 DIAGNOSIS — Z8249 Family history of ischemic heart disease and other diseases of the circulatory system: Secondary | ICD-10-CM | POA: Diagnosis not present

## 2017-07-11 DIAGNOSIS — G4730 Sleep apnea, unspecified: Secondary | ICD-10-CM | POA: Diagnosis not present

## 2017-07-11 DIAGNOSIS — E119 Type 2 diabetes mellitus without complications: Secondary | ICD-10-CM | POA: Diagnosis not present

## 2017-07-11 DIAGNOSIS — Z9049 Acquired absence of other specified parts of digestive tract: Secondary | ICD-10-CM | POA: Diagnosis not present

## 2017-07-11 DIAGNOSIS — Z7984 Long term (current) use of oral hypoglycemic drugs: Secondary | ICD-10-CM | POA: Insufficient documentation

## 2017-07-11 DIAGNOSIS — D123 Benign neoplasm of transverse colon: Secondary | ICD-10-CM | POA: Diagnosis not present

## 2017-07-11 DIAGNOSIS — Z8261 Family history of arthritis: Secondary | ICD-10-CM | POA: Diagnosis not present

## 2017-07-11 DIAGNOSIS — Z8042 Family history of malignant neoplasm of prostate: Secondary | ICD-10-CM | POA: Diagnosis not present

## 2017-07-11 HISTORY — DX: Type 2 diabetes mellitus without complications: E11.9

## 2017-07-11 HISTORY — PX: COLONOSCOPY: SHX5424

## 2017-07-11 LAB — GLUCOSE, CAPILLARY: GLUCOSE-CAPILLARY: 107 mg/dL — AB (ref 65–99)

## 2017-07-11 SURGERY — COLONOSCOPY
Anesthesia: Moderate Sedation

## 2017-07-11 MED ORDER — MIDAZOLAM HCL 5 MG/5ML IJ SOLN
INTRAMUSCULAR | Status: AC
  Filled 2017-07-11: qty 10

## 2017-07-11 MED ORDER — MEPERIDINE HCL 100 MG/ML IJ SOLN
INTRAMUSCULAR | Status: DC | PRN
  Administered 2017-07-11: 25 mg
  Administered 2017-07-11: 50 mg
  Administered 2017-07-11: 25 mg

## 2017-07-11 MED ORDER — MIDAZOLAM HCL 5 MG/5ML IJ SOLN
INTRAMUSCULAR | Status: DC | PRN
  Administered 2017-07-11: 1 mg via INTRAVENOUS
  Administered 2017-07-11: 2 mg via INTRAVENOUS
  Administered 2017-07-11 (×2): 1 mg via INTRAVENOUS
  Administered 2017-07-11: 2 mg via INTRAVENOUS

## 2017-07-11 MED ORDER — ONDANSETRON HCL 4 MG/2ML IJ SOLN
INTRAMUSCULAR | Status: DC | PRN
  Administered 2017-07-11: 4 mg via INTRAVENOUS

## 2017-07-11 MED ORDER — SODIUM CHLORIDE 0.9 % IV SOLN
INTRAVENOUS | Status: DC
  Administered 2017-07-11: 1000 mL via INTRAVENOUS

## 2017-07-11 MED ORDER — MEPERIDINE HCL 100 MG/ML IJ SOLN
INTRAMUSCULAR | Status: AC
  Filled 2017-07-11: qty 2

## 2017-07-11 MED ORDER — ONDANSETRON HCL 4 MG/2ML IJ SOLN
INTRAMUSCULAR | Status: AC
  Filled 2017-07-11: qty 2

## 2017-07-11 NOTE — Op Note (Signed)
[facility] Patient Name: [name] Procedure Date: 07/11/2017 8:10 AM MRN: [id number] Date of Birth: August 19, 1950 Attending MD: Norvel Richards , MD CSN: [id number] Age: 67 Admit Type: Outpatient Procedure:                Colonoscopy Indications:              Screening for colorectal malignant neoplasm Providers:                Norvel Richards, MD, Janeece Riggers, RN, Nelma Rothman, Technician Referring MD:              Medicines:                Midazolam 7 mg IV, Meperidine 532 mg IV Complications:            No immediate complications. Estimated Blood Loss:     Estimated blood loss was minimal. Procedure:                Pre-Anesthesia Assessment:                           - Prior to the procedure, a History and Physical                            was performed, and patient medications and                            allergies were reviewed. The patient's tolerance of                            previous anesthesia was also reviewed. The risks                            and benefits of the procedure and the sedation                            options and risks were discussed with the patient.                            All questions were answered, and informed consent                            was obtained. Prior Anticoagulants: The patient has                            taken no previous anticoagulant or antiplatelet                            agents. ASA Grade Assessment: II - A patient with                            mild systemic disease. After reviewing the risks  and benefits, the patient was deemed in                            satisfactory condition to undergo the procedure.                           After obtaining informed consent, the colonoscope                            was passed under direct vision. Throughout the                            procedure, the patient's blood pressure, pulse, and                oxygen saturations were monitored continuously. The                            EC-[id number] ([id number]) scope was introduced through                            the anus and advanced to the the cecum, identified                            by appendiceal orifice and ileocecal valve. The                            colonoscopy was performed without difficulty. The                            patient tolerated the procedure well. The quality                            of the bowel preparation was adequate. The                            ileocecal valve, appendiceal orifice, and rectum                            were photographed. The entire colon was well                            visualized. The quality of the bowel preparation                            was adequate. Scope In: 8:33:16 AM Scope Out: 8:51:13 AM Scope Withdrawal Time: 0 hours 11 minutes 46 seconds  Total Procedure Duration: 0 hours 17 minutes 57 seconds  Findings:      The perianal and digital rectal examinations were normal.      (4) semi-pedunculated polyps were found in the sigmoid colon, hepatic       flexure and cecum. The polyps were 4 to 6 mm in size. These polyps were       removed with a cold snare. Resection complete. The 2 smallest polyp       hepatic flexure did not  survive the recovery process. Estimated blood       loss was minimal. Estimated blood loss was minimal.      The exam was otherwise without abnormality on direct and retroflexion       views. Impression:               - (4) 4 to 6 mm polyps in the sigmoid colon, at the                            hepatic flexure and in the cecum, removed with a                            cold snare. Resected and retrieved.                           - The examination was otherwise normal on direct                            and retroflexion views. Moderate Sedation:      Moderate (conscious) sedation was administered by the endoscopy nurse       and supervised  by the endoscopist. The following parameters were       monitored: oxygen saturation, heart rate, blood pressure, respiratory       rate, EKG, adequacy of pulmonary ventilation, and response to care.       Total physician intraservice time was 24 minutes. Recommendation:           - Patient has a contact number available for                            emergencies. The signs and symptoms of potential                            delayed complications were discussed with the                            patient. Return to normal activities tomorrow.                            Written discharge instructions were provided to the                            patient.                           - Resume previous diet.                           - Repeat colonoscopy date to be determined after                            pending pathology results are reviewed for                            surveillance.                           -  Return to GI office (date not yet determined). Procedure Code(s):        --- Professional ---                           [phone], Colonoscopy, flexible; with removal of                            tumor(s), polyp(s), or other lesion(s) by snare                            technique                           G0500, Moderate sedation services provided by the                            same physician or other qualified health care                            professional performing a gastrointestinal                            endoscopic service that sedation supports,                            requiring the presence of an independent trained                            observer to assist in the monitoring of the                            patient's level of consciousness and physiological                            status; initial 15 minutes of intra-service time;                            patient age 35 years or older (additional time may                            be reported with  [phone], as appropriate)                           [phone], Moderate sedation services provided by the                            same physician or other qualified health care                            professional performing the diagnostic or                            therapeutic service that the sedation supports,  requiring the presence of an independent trained                            observer to assist in the monitoring of the                            patient's level of consciousness and physiological                            status; each additional 15 minutes intraservice                            time (List separately in addition to code for                            primary service) Diagnosis Code(s):        --- Professional ---                           Z12.11, Encounter for screening for malignant                            neoplasm of colon                           D12.5, Benign neoplasm of sigmoid colon                           D12.3, Benign neoplasm of transverse colon (hepatic                            flexure or splenic flexure)                           D12.0, Benign neoplasm of cecum CPT copyright 2017 [employer]. All rights reserved. The codes documented in this report are preliminary and upon coder review may  be revised to meet current compliance requirements. Cristopher Estimable. Tyreona Panjwani, MD Norvel Richards, MD 07/11/2017 8:58:37 AM This report has been signed electronically. Number of Addenda: 0

## 2017-07-11 NOTE — H&P (Signed)
$'@LOGO'I$ @   Primary Care Physician:  Kathyrn Drown, MD Primary Gastroenterologist:  Dr. Gala Romney  Pre-Procedure History & Physical: HPI:  [name] is a 67 y.o. female is here for a screening colonoscopy. Negative colonoscopy 2004.  Past Medical History:  Diagnosis Date  . Diabetes mellitus without complication ([place])   . Hyperlipidemia   . Hypertension   . Mild sleep apnea   . Prediabetes   . Prolonged QT interval     Past Surgical History:  Procedure Laterality Date  . ABDOMINAL HYSTERECTOMY    . CHOLECYSTECTOMY    . COLONOSCOPY      Prior to Admission medications   Medication Sig Start Date End Date Taking? Authorizing Provider  atorvastatin (LIPITOR) 80 MG tablet Take 1 tablet (80 mg total) by mouth daily. 05/07/17  Yes Luking, Elayne Snare, MD  enalapril-hydrochlorothiazide (VASERETIC) 10-25 MG tablet Take 1 tablet by mouth daily. 05/07/17  Yes Kathyrn Drown, MD  metFORMIN (GLUCOPHAGE) 500 MG tablet Take 1/2 bid Patient taking differently: Take 250 mg by mouth 2 (two) times daily with a meal.  05/07/17  Yes Luking, Scott A, MD  Na Sulfate-K Sulfate-Mg Sulf (SUPREP BOWEL PREP KIT) 17.5-3.13-1.6 GM/177ML SOLN Take 1 kit by mouth as directed. 05/21/17  Yes Carlis Stable, NP  meclizine (ANTIVERT) 25 MG tablet Take 1 tablet (25 mg total) by mouth 3 (three) times daily as needed for dizziness. Patient not taking: Reported on 07/04/2017 04/25/16   Francine Graven, DO    Allergies as of 05/21/2017  . (No Known Allergies)    Family History  Problem Relation Age of Onset  . Heart disease Mother   . Diabetes Mother   . Arthritis Father   . Cancer Father        prostate  . Diabetes Maternal Grandmother     Social History   Socioeconomic History  . Marital status: Married    Spouse name: Not on file  . Number of children: Not on file  . Years of education: Not on file  . Highest education level: Not on file  Occupational History  . Not on file  Social Needs  . Financial  resource strain: Not on file  . Food insecurity:    Worry: Not on file    Inability: Not on file  . Transportation needs:    Medical: Not on file    Non-medical: Not on file  Tobacco Use  . Smoking status: Former Smoker    Types: Cigarettes    Last attempt to quit: 06/17/1992    Years since quitting: 25.0  . Smokeless tobacco: Never Used  . Tobacco comment: less than a pack a week  Substance and Sexual Activity  . Alcohol use: No  . Drug use: No  . Sexual activity: Not on file  Lifestyle  . Physical activity:    Days per week: Not on file    Minutes per session: Not on file  . Stress: Not on file  Relationships  . Social connections:    Talks on phone: Not on file    Gets together: Not on file    Attends religious service: Not on file    Active member of club or organization: Not on file    Attends meetings of clubs or organizations: Not on file    Relationship status: Not on file  . Intimate partner violence:    Fear of current or ex partner: Not on file    Emotionally abused: Not on  file    Physically abused: Not on file    Forced sexual activity: Not on file  Other Topics Concern  . Not on file  Social History Narrative  . Not on file    Review of Systems: See HPI, otherwise negative ROS  Physical Exam: BP (!) 160/69   Pulse (!) 101   Temp 98.8 F (37.1 C) (Oral)   Resp 19   Ht '4\' 11"'$  (1.499 m)   Wt 208 lb (94.3 kg)   SpO2 96%   BMI 42.01 kg/m  General:   Alert,  Well-developed, well-nourished, pleasant and cooperative in NAD Lungs:  Clear throughout to auscultation.   No wheezes, crackles, or rhonchi. No acute distress. Heart:  Regular rate and rhythm; no murmurs, clicks, rubs,  or gallops. Abdomen:  Soft, nontender and nondistended. No masses, hepatosplenomegaly or hernias noted. Normal bowel sounds, without guarding, and without rebound.   Impression/Plan: [name] is now here to undergo a screening colonoscopy.  Risks, benefits,  limitations, imponderables and alternatives regarding colonoscopy have been reviewed with the patient. Questions have been answered. All parties agreeable.     Notice:  This dictation was prepared with Dragon dictation along with smaller phrase technology. Any transcriptional errors that result from this process are unintentional and may not be corrected upon review.

## 2017-07-11 NOTE — Discharge Instructions (Signed)
°Colonoscopy °Discharge Instructions ° °Read the instructions outlined below and refer to this sheet in the next few weeks. These discharge instructions provide you with general information on caring for yourself after you leave the hospital. Your doctor may also give you specific instructions. While your treatment has been planned according to the most current medical practices available, unavoidable complications occasionally occur. If you have any problems or questions after discharge, call Dr. Rourk at [phone]. °ACTIVITY °· You may resume your regular activity, but move at a slower pace for the next 24 hours.  °· Take frequent rest periods for the next 24 hours.  °· Walking will help get rid of the air and reduce the bloated feeling in your belly (abdomen).  °· No driving for 24 hours (because of the medicine (anesthesia) used during the test).   °· Do not sign any important legal documents or operate any machinery for 24 hours (because of the anesthesia used during the test).  °NUTRITION °· Drink plenty of fluids.  °· You may resume your normal diet as instructed by your doctor.  °· Begin with a light meal and progress to your normal diet. Heavy or fried foods are harder to digest and may make you feel sick to your stomach (nauseated).  °· Avoid alcoholic beverages for 24 hours or as instructed.  °MEDICATIONS °· You may resume your normal medications unless your doctor tells you otherwise.  °WHAT YOU CAN EXPECT TODAY °· Some feelings of bloating in the abdomen.  °· Passage of more gas than usual.  °· Spotting of blood in your stool or on the toilet paper.  °IF YOU HAD POLYPS REMOVED DURING THE COLONOSCOPY: °· No aspirin products for 7 days or as instructed.  °· No alcohol for 7 days or as instructed.  °· Eat a soft diet for the next 24 hours.  °FINDING OUT THE RESULTS OF YOUR TEST °Not all test results are available during your visit. If your test results are not back during the visit, make an appointment  with your caregiver to find out the results. Do not assume everything is normal if you have not heard from your caregiver or the medical facility. It is important for you to follow up on all of your test results.  °SEEK IMMEDIATE MEDICAL ATTENTION IF: °· You have more than a spotting of blood in your stool.  °· Your belly is swollen (abdominal distention).  °· You are nauseated or vomiting.  °· You have a temperature over 101.  °· You have abdominal pain or discomfort that is severe or gets worse throughout the day.  ° ° °Polyp information provided ° °Further recommendations to follow pending review of pathology report ° ° °Colon Polyps °Polyps are tissue growths inside the body. Polyps can grow in many places, including the large intestine (colon). A polyp may be a round bump or a mushroom-shaped growth. You could have one polyp or several. °Most colon polyps are noncancerous (benign). However, some colon polyps can become cancerous over time. °What are the causes? °The exact cause of colon polyps is not known. °What increases the risk? °This condition is more likely to develop in people who: °· Have a family history of colon cancer or colon polyps. °· Are older than 50 or older than 45 if they are African American. °· Have inflammatory bowel disease, such as ulcerative colitis or Crohn disease. °· Are overweight. °· Smoke cigarettes. °· Do not get enough exercise. °· Drink too much alcohol. °·   Eat a diet that is: °? High in fat and red meat. °? Low in fiber. °· Had childhood cancer that was treated with abdominal radiation. ° °What are the signs or symptoms? °Most polyps do not cause symptoms. If you have symptoms, they may include: °· Blood coming from your rectum when having a bowel movement. °· Blood in your stool. The stool may look dark red or black. °· A change in bowel habits, such as constipation or diarrhea. ° °How is this diagnosed? °This condition is diagnosed with a colonoscopy. This is a procedure  that uses a lighted, flexible scope to look at the inside of your colon. °How is this treated? °Treatment for this condition involves removing any polyps that are found. Those polyps will then be tested for cancer. If cancer is found, your health care provider will talk to you about options for colon cancer treatment. °Follow these instructions at home: °Diet °· Eat plenty of fiber, such as fruits, vegetables, and whole grains. °· Eat foods that are high in calcium and vitamin D, such as milk, cheese, yogurt, eggs, liver, fish, and broccoli. °· Limit foods high in fat, red meats, and processed meats, such as hot dogs, sausage, bacon, and lunch meats. °· Maintain a healthy weight, or lose weight if recommended by your health care provider. °General instructions °· Do not smoke cigarettes. °· Do not drink alcohol excessively. °· Keep all follow-up visits as told by your health care provider. This is important. This includes keeping regularly scheduled colonoscopies. Talk to your health care provider about when you need a colonoscopy. °· Exercise every day or as told by your health care provider. °Contact a health care provider if: °· You have new or worsening bleeding during a bowel movement. °· You have new or increased blood in your stool. °· You have a change in bowel habits. °· You unexpectedly lose weight. °This information is not intended to replace advice given to you by your health care provider. Make sure you discuss any questions you have with your health care provider. ° °

## 2017-07-13 ENCOUNTER — Encounter: Payer: Self-pay | Admitting: Internal Medicine

## 2017-07-16 ENCOUNTER — Encounter (HOSPITAL_COMMUNITY): Payer: Self-pay | Admitting: Internal Medicine

## 2017-07-24 ENCOUNTER — Telehealth: Payer: Self-pay | Admitting: Internal Medicine

## 2017-07-24 NOTE — Telephone Encounter (Signed)
[phone] please call patient, she saw her letter from [place] on my chart and now it is gone and wants to know if it can be out back in so she can show her husband

## 2017-07-24 NOTE — Telephone Encounter (Signed)
Spoke with pt and her result note isn't able to be seen by pt anymore. Will resend result letter to pt.

## 2017-08-31 ENCOUNTER — Encounter: Payer: Self-pay | Admitting: Family Medicine

## 2017-08-31 ENCOUNTER — Ambulatory Visit (INDEPENDENT_AMBULATORY_CARE_PROVIDER_SITE_OTHER): Payer: Medicare Other | Admitting: Family Medicine

## 2017-08-31 DIAGNOSIS — I878 Other specified disorders of veins: Secondary | ICD-10-CM

## 2017-08-31 MED ORDER — HYDROCHLOROTHIAZIDE 25 MG PO TABS: ORAL_TABLET | ORAL | 5 refills | Status: DC

## 2017-08-31 NOTE — Progress Notes (Signed)
   Subjective:    Patient ID: [name], female    DOB: 29-Jul-1950, 67 y.o.   MRN: [id number]  HPIBilateral ankle swelling for a few months.   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  Progressive in nature to stands with her job a lot.  Not exercising much these days.  Has been gradual weight gain over time.  No calf pain.  Some ankle discomfort when having to stand for long periods of time secondary to the swelling Review of Systems No headache, no major weight loss or weight gain, no chest pain no back pain abdominal pain no change in bowel habits complete ROS otherwise negative Ankles bilateral 1+ edema    Objective:   Physical Exam  Alert and oriented, vitals reviewed and stable, NAD ENT-TM's and ext canals WNL bilat via otoscopic exam Soft palate, tonsils and post pharynx WNL via oropharyngeal exam Neck-symmetric, no masses; thyroid nonpalpable and nontender Pulmonary-no tachypnea or accessory muscle use; Clear without wheezes via auscultation Card--no abnrml murmurs, rhythm reg and rate WNL Carotid pulses symmetric, without bruits Ankles bilateral 1+ edema perhaps 1.5 in right leg negative Homans sign.  Negative calf tenderness.  Good ankle range of motion.  Also exacerbated by substantial obesity both legs review of prior records  Reveals normal renal function and normal liver function recently    Assessment & Plan:  1 impression impression venous stasis.  Discussed at great length.  Chance of cardiac etiology virtually 0.  Often asymmetric nature of venous stasis discussed with chronic insidious nature also discussed potential etiologies discussed.  No need for further major work-up.  Compression stockings on the patient not inclined to use them at this time.   We will add a low-dose diuretic to current regimen.  Might help a little in blood pressure also help a bit with swelling.  Regular exercise important warning signs discussed  Greater than 50% of this 25 minute face to face visit was spent in counseling and discussion and coordination of care regarding the above diagnosis/diagnosies

## 2017-09-02 DIAGNOSIS — I878 Other specified disorders of veins: Secondary | ICD-10-CM | POA: Insufficient documentation

## 2017-09-27 ENCOUNTER — Telehealth: Payer: Self-pay | Admitting: Family Medicine

## 2017-09-27 DIAGNOSIS — I10 Essential (primary) hypertension: Secondary | ICD-10-CM

## 2017-09-27 DIAGNOSIS — E119 Type 2 diabetes mellitus without complications: Secondary | ICD-10-CM

## 2017-09-27 NOTE — Telephone Encounter (Signed)
Patient is requesting labs for 6 month follow up on 9/13

## 2017-09-27 NOTE — Telephone Encounter (Signed)
Last labs on 05/04/17; urine micro, hgb a1c, bmp, hepatic,lipid

## 2017-09-28 NOTE — Telephone Encounter (Signed)
I called and left a detailed message about the labs asked that she please call our ofice to confirm.

## 2017-09-28 NOTE — Telephone Encounter (Signed)
A1c metabolic 7 

## 2017-09-29 ENCOUNTER — Other Ambulatory Visit: Payer: Self-pay | Admitting: Family Medicine

## 2017-10-02 NOTE — Telephone Encounter (Signed)
Left message to return call 

## 2017-10-04 NOTE — Telephone Encounter (Signed)
Pt.notified

## 2017-10-23 DIAGNOSIS — E119 Type 2 diabetes mellitus without complications: Secondary | ICD-10-CM | POA: Diagnosis not present

## 2017-10-23 DIAGNOSIS — I10 Essential (primary) hypertension: Secondary | ICD-10-CM | POA: Diagnosis not present

## 2017-10-24 LAB — BASIC METABOLIC PANEL
BUN / CREAT RATIO: 25 (ref 12–28)
BUN: 21 mg/dL (ref 8–27)
CO2: 26 mmol/L (ref 20–29)
CREATININE: 0.84 mg/dL (ref 0.57–1.00)
Calcium: 10.3 mg/dL (ref 8.7–10.3)
Chloride: 98 mmol/L (ref 96–106)
GFR calc Af Amer: 83 mL/min/{1.73_m2} (ref >59)
GFR, EST NON AFRICAN AMERICAN: 72 mL/min/{1.73_m2} (ref >59)
GLUCOSE: 106 mg/dL — AB (ref 65–99)
POTASSIUM: 4.2 mmol/L (ref 3.5–5.2)
SODIUM: 141 mmol/L (ref 134–144)

## 2017-10-24 LAB — HEMOGLOBIN A1C
Est. average glucose Bld gHb Est-mCnc: 146 mg/dL
HEMOGLOBIN A1C: 6.7 % — AB (ref 4.8–5.6)

## 2017-11-08 ENCOUNTER — Ambulatory Visit: Payer: Medicare Other | Admitting: Family Medicine

## 2017-11-09 ENCOUNTER — Ambulatory Visit (INDEPENDENT_AMBULATORY_CARE_PROVIDER_SITE_OTHER): Payer: Medicare Other | Admitting: Family Medicine

## 2017-11-09 ENCOUNTER — Encounter: Payer: Self-pay | Admitting: Family Medicine

## 2017-11-09 VITALS — BP 132/68 | Ht 59.0 in | Wt 202.4 lb

## 2017-11-09 DIAGNOSIS — I10 Essential (primary) hypertension: Secondary | ICD-10-CM | POA: Diagnosis not present

## 2017-11-09 DIAGNOSIS — E119 Type 2 diabetes mellitus without complications: Secondary | ICD-10-CM

## 2017-11-09 DIAGNOSIS — E782 Mixed hyperlipidemia: Secondary | ICD-10-CM

## 2017-11-09 DIAGNOSIS — E785 Hyperlipidemia, unspecified: Secondary | ICD-10-CM

## 2017-11-09 DIAGNOSIS — Z23 Encounter for immunization: Secondary | ICD-10-CM | POA: Diagnosis not present

## 2017-11-09 MED ORDER — HYDROCHLOROTHIAZIDE 25 MG PO TABS: ORAL_TABLET | ORAL | 5 refills | Status: DC

## 2017-11-09 MED ORDER — ATORVASTATIN CALCIUM 80 MG PO TABS: 80.0000 mg | ORAL_TABLET | Freq: Every day | ORAL | 6 refills | Status: DC

## 2017-11-09 MED ORDER — METFORMIN HCL 500 MG PO TABS: ORAL_TABLET | ORAL | 2 refills | Status: DC

## 2017-11-09 MED ORDER — ENALAPRIL-HYDROCHLOROTHIAZIDE 10-25 MG PO TABS: 1.0000 | ORAL_TABLET | Freq: Every day | ORAL | 5 refills | Status: DC

## 2017-11-09 NOTE — Progress Notes (Signed)
Subjective:    Patient ID: [name], female    DOB: 1950/03/11, 67 y.o.   MRN: [id number]  Diabetes  She presents for her follow-up diabetic visit. She has type 2 diabetes mellitus. Pertinent negatives for hypoglycemia include no confusion or dizziness. Pertinent negatives for diabetes include no chest pain, no fatigue, no polydipsia, no polyphagia and no weakness. She is compliant with treatment all of the time. Home blood sugar record trend: 109 today. She does not see a podiatrist.Eye exam is current (overdue. has not seen this year).  A1C on bloodwork 6.7.  Patient for blood pressure check up.  The patient does have hypertension.  The patient is on medication.  Patient relates compliance with meds. Todays BP reviewed with the patient. Patient denies issues with medication. Patient relates reasonable diet. Patient tries to minimize salt. Patient aware of BP goals.  Patient here for follow-up regarding cholesterol.  The patient does have hyperlipidemia.  Patient does try to maintain a reasonable diet.  Patient does take the medication on a regular basis.  Denies missing a dose.  The patient denies any obvious side effects.  Prior blood work results reviewed with the patient.  The patient is aware of his cholesterol goals and the need to keep it under good control to lessen the risk of disease.  Patient dieting and losing weight  Occasional right knee pain with walking not locking up  Flu vaccine today.   Pt states no concerns today.    Review of Systems  Constitutional: Negative for activity change, appetite change and fatigue.  HENT: Negative for congestion and rhinorrhea.   Respiratory: Negative for cough and shortness of breath.   Cardiovascular: Negative for chest pain and leg swelling.  Gastrointestinal: Negative for abdominal pain and diarrhea.  Endocrine: Negative for polydipsia and polyphagia.  Skin: Negative for color change.  Neurological: Negative for dizziness and  weakness.  Psychiatric/Behavioral: Negative for behavioral problems and confusion.       Objective:   Physical Exam  Constitutional: She appears well-nourished. No distress.  HENT:  Head: Normocephalic and atraumatic.  Eyes: Right eye exhibits no discharge. Left eye exhibits no discharge.  Neck: No tracheal deviation present.  Cardiovascular: Normal rate, regular rhythm and normal heart sounds.  No murmur heard. Pulmonary/Chest: Effort normal and breath sounds normal. No respiratory distress.  Musculoskeletal: She exhibits no edema.  Lymphadenopathy:    She has no cervical adenopathy.  Neurological: She is alert. Coordination normal.  Skin: Skin is warm and dry.  Psychiatric: She has a normal mood and affect. Her behavior is normal.  Vitals reviewed.  Results for orders placed or performed in visit on 09/27/17  Hemoglobin A1c  Result Value Ref Range   Hgb A1c MFr Bld 6.7 (H) 4.8 - 5.6 %   Est. average glucose Bld gHb Est-mCnc 146 mg/dL  Basic metabolic panel  Result Value Ref Range   Glucose 106 (H) 65 - 99 mg/dL   BUN 21 8 - 27 mg/dL   Creatinine, Ser 0.84 0.57 - 1.00 mg/dL   GFR calc non Af Amer 72 >59 mL/min/1.73   GFR calc Af Amer 83 >59 mL/min/1.73   BUN/Creatinine Ratio 25 12 - 28   Sodium 141 134 - 144 mmol/L   Potassium 4.2 3.5 - 5.2 mmol/L   Chloride 98 96 - 106 mmol/L   CO2 26 20 - 29 mmol/L   Calcium 10.3 8.7 - 10.3 mg/dL  Assessment & Plan:  HTN- Patient was seen today as part of a visit regarding hypertension. The importance of healthy diet and regular physical activity was discussed. The importance of compliance with medications discussed.  Ideal goal is to keep blood pressure low elevated levels certainly below 025/85 when possible.  The patient was counseled that keeping blood pressure under control lessen his risk of complications.  The importance of regular follow-ups was discussed with the patient.  Low-salt diet such as DASH recommended.   Regular physical activity was recommended as well.  Patient was advised to keep regular follow-ups.  The patient was seen today as part of an evaluation regarding hyperlipidemia.  Recent lab work has been reviewed with the patient as well as the goals for good cholesterol care.  In addition to this medications have been discussed the importance of compliance with diet and medications discussed as well.  Finally the patient is aware that poor control of cholesterol, noncompliance can dramatically increase the risk of complications. The patient will keep regular office visits and the patient does agreed to periodic lab work.  The patient was seen today as part of a comprehensive visit for diabetes. The importance of keeping her A1c at or below 7 was discussed.  Importance of regular physical activity was discussed.   The importance of adherence to medication as well as a controlled low starch/sugar diet was also discussed.  Standard follow-up visit recommended.  Also patient aware failure to keep diabetes under control increases the risk of complications.

## 2017-11-21 DIAGNOSIS — E119 Type 2 diabetes mellitus without complications: Secondary | ICD-10-CM | POA: Diagnosis not present

## 2018-02-08 ENCOUNTER — Telehealth: Payer: Self-pay | Admitting: Family Medicine

## 2018-02-08 NOTE — Telephone Encounter (Signed)
Advised patient that We sympathize with her condition It is possible the metformin could be contributing to this It is also possible that other factors may be Dr Nicki Reaper would recommend dropping the metformin to 1/2 tablet daily Dr Nicki Reaper would recommend that she give [place] feedback in several weeks how she is doing with this lower dose And to do a follow-up office visit if ongoing troubles Patient verbalized understanding.

## 2018-02-08 NOTE — Telephone Encounter (Signed)
I certainly understand what she is saying  We sympathize with her condition It is possible the metformin could be contributing to this It is also possible that other factors may be I would recommend dropping the metformin to 1/2 tablet daily I would recommend that she give [place] feedback in several weeks how she is doing with this lower dose And to do a follow-up office visit if ongoing troubles

## 2018-02-08 NOTE — Telephone Encounter (Signed)
Pt states ever since late September beginning of October pt has been experiencing diarrhea at least once week, pt states she is bloated but no abd pain, no fever,no nausea or vomiting, and eating okay. Pt states she thinks these symptoms are coming from her metFORMIN (GLUCOPHAGE) 500 MG tablet. Advise.   Pharmacy:  [id number] - [place], [place]

## 2018-05-01 ENCOUNTER — Telehealth: Payer: Self-pay | Admitting: Family Medicine

## 2018-05-01 DIAGNOSIS — E785 Hyperlipidemia, unspecified: Secondary | ICD-10-CM

## 2018-05-01 DIAGNOSIS — Z79899 Other long term (current) drug therapy: Secondary | ICD-10-CM

## 2018-05-01 DIAGNOSIS — E782 Mixed hyperlipidemia: Secondary | ICD-10-CM

## 2018-05-01 DIAGNOSIS — I10 Essential (primary) hypertension: Secondary | ICD-10-CM

## 2018-05-01 DIAGNOSIS — E119 Type 2 diabetes mellitus without complications: Secondary | ICD-10-CM

## 2018-05-01 NOTE — Telephone Encounter (Signed)
Last labs 09/2017- Met 7 and HgbA1c                 04/2017- Lipid, Liver, Met 7, HgbA1c, Microalbumin urine

## 2018-05-01 NOTE — Telephone Encounter (Signed)
Has an appt on March 10th and would like labwork done prior to appt at [place].

## 2018-05-05 NOTE — Telephone Encounter (Signed)
Patient had new S1X, metabolic 7, lipid, liver.  She does not have to be fasting.  She can go on Monday.  To do a urine micro protein with it.  If patient unable to go on Monday then please go ahead and have her do all the labs on Tuesday but we could do A1c here at the office when she comes for her appointment

## 2018-05-06 DIAGNOSIS — E119 Type 2 diabetes mellitus without complications: Secondary | ICD-10-CM | POA: Diagnosis not present

## 2018-05-06 DIAGNOSIS — Z79899 Other long term (current) drug therapy: Secondary | ICD-10-CM | POA: Diagnosis not present

## 2018-05-06 DIAGNOSIS — E785 Hyperlipidemia, unspecified: Secondary | ICD-10-CM | POA: Diagnosis not present

## 2018-05-06 DIAGNOSIS — I10 Essential (primary) hypertension: Secondary | ICD-10-CM | POA: Diagnosis not present

## 2018-05-06 NOTE — Telephone Encounter (Signed)
Patient will go today and have labs drawn. Orders placed in Epic.

## 2018-05-07 ENCOUNTER — Ambulatory Visit (INDEPENDENT_AMBULATORY_CARE_PROVIDER_SITE_OTHER): Payer: Medicare Other | Admitting: Family Medicine

## 2018-05-07 ENCOUNTER — Encounter: Payer: Self-pay | Admitting: Family Medicine

## 2018-05-07 ENCOUNTER — Other Ambulatory Visit: Payer: Self-pay | Admitting: Family Medicine

## 2018-05-07 ENCOUNTER — Telehealth: Payer: Self-pay | Admitting: *Deleted

## 2018-05-07 VITALS — BP 124/80 | Ht 59.0 in | Wt 193.0 lb

## 2018-05-07 DIAGNOSIS — E782 Mixed hyperlipidemia: Secondary | ICD-10-CM | POA: Diagnosis not present

## 2018-05-07 DIAGNOSIS — I10 Essential (primary) hypertension: Secondary | ICD-10-CM

## 2018-05-07 DIAGNOSIS — R1084 Generalized abdominal pain: Secondary | ICD-10-CM

## 2018-05-07 DIAGNOSIS — R197 Diarrhea, unspecified: Secondary | ICD-10-CM | POA: Diagnosis not present

## 2018-05-07 DIAGNOSIS — E785 Hyperlipidemia, unspecified: Secondary | ICD-10-CM | POA: Diagnosis not present

## 2018-05-07 NOTE — Progress Notes (Signed)
CT ordered and pt is aware.

## 2018-05-07 NOTE — Telephone Encounter (Signed)
Discussed patient with Dr. Wolfgang Phoenix.

## 2018-05-07 NOTE — Progress Notes (Signed)
Subjective:    Patient ID: [name], female    DOB: 04-13-1950, 68 y.o.   MRN: [id number]  HPI Patient is here today to follow up on her chronic health issues.  Hypertension:She takes Enalapril 10-25 mg one per day,Hctz 25 mg 1/2 per day. Patient for blood pressure check up.  The patient does have hypertension.  The patient is on medication.  Patient relates compliance with meds. Todays BP reviewed with the patient. Patient denies issues with medication. Patient relates reasonable diet. Patient tries to minimize salt. Patient aware of BP goals.  Diabetes:Last A1c was done on 05/06/2018 @ 6.2, She takes Metformin 500 mg 1/2 per day supposed to be bid,but she is having some Gi issues with it. The patient was seen today as part of a comprehensive diabetic check up.the patient does have diabetes.  The patient follows here on a regular basis.  The patient relates medication compliance. No significant side effects to the medications. Denies any low glucose spells. Relates compliance with diet to a reasonable level. Patient does do labwork intermittently and understands the dangers of diabetes.   Hyperlipidemia: She is taking Atorvastatin 80 mg once per day. Patient here for follow-up regarding cholesterol.  The patient does have hyperlipidemia.  Patient does try to maintain a reasonable diet.  Patient does take the medication on a regular basis.  Denies missing a dose.  The patient denies any obvious side effects.  Prior blood work results reviewed with the patient.  The patient is aware of his cholesterol goals and the need to keep it under good control to lessen the risk of disease.  She tries to eat healthy and exercise.  She does not see any specialist.  She relates intermittent abdominal discomforts more in the upper abdomen across the middle of the abdomen in addition is having some intermittent diarrhea plus also denies any high fever chills sweats denies rectal bleeding denies mucus  in the stools.  Symptoms been going on over the past 6 weeks but worse over the past few weeks it does wake her up in the morning hours.      Review of Systems  Constitutional: Negative for activity change, appetite change and fatigue.  HENT: Negative for congestion and rhinorrhea.   Respiratory: Negative for cough and shortness of breath.   Cardiovascular: Negative for chest pain and leg swelling.  Gastrointestinal: Positive for diarrhea. Negative for abdominal pain.  Endocrine: Negative for polydipsia and polyphagia.  Skin: Negative for color change.  Neurological: Negative for dizziness and weakness.  Psychiatric/Behavioral: Negative for behavioral problems and confusion.       Objective:   Physical Exam Vitals signs reviewed.  Constitutional:      General: She is not in acute distress. HENT:     Head: Normocephalic and atraumatic.  Eyes:     General:        Right eye: No discharge.        Left eye: No discharge.  Neck:     Trachea: No tracheal deviation.  Cardiovascular:     Rate and Rhythm: Normal rate and regular rhythm.     Heart sounds: Normal heart sounds. No murmur.  Pulmonary:     Effort: Pulmonary effort is normal. No respiratory distress.     Breath sounds: Normal breath sounds.  Lymphadenopathy:     Cervical: No cervical adenopathy.  Skin:    General: Skin is warm and dry.  Neurological:     Mental Status: She is alert.  Coordination: Coordination normal.  Psychiatric:        Behavior: Behavior normal.    Abdominal tenderness along the upper abdomen mid abdomen left upper quadrant right upper quadrant no guarding or rebound       Assessment & Plan:  HTN- Patient was seen today as part of a visit regarding hypertension. The importance of healthy diet and regular physical activity was discussed. The importance of compliance with medications discussed.  Ideal goal is to keep blood pressure low elevated levels certainly below 778/24 when possible.    The patient was counseled that keeping blood pressure under control lessen his risk of complications.  The importance of regular follow-ups was discussed with the patient.  Low-salt diet such as DASH recommended.  Regular physical activity was recommended as well.  Patient was advised to keep regular follow-ups.  The patient was seen today as part of an evaluation regarding hyperlipidemia.  Recent lab work has been reviewed with the patient as well as the goals for good cholesterol care.  In addition to this medications have been discussed the importance of compliance with diet and medications discussed as well.  Finally the patient is aware that poor control of cholesterol, noncompliance can dramatically increase the risk of complications. The patient will keep regular office visits and the patient does agreed to periodic lab work.  Intermittent abdominal discomfort- given physical findings along with historical findings I recommend stopping the metformin for 2 weeks to see if that has a role in this do a bowel diary We will also do lab work await the results In addition to this I recommend CT scan abdomen and pelvis with contrast I did have discussion with gastroenterology-Leslie Lewis about this if patient has ongoing troubles we will help set her up with gastroenterology  The patient was seen today as part of a comprehensive visit for diabetes. The importance of keeping her A1c at or below 7 was discussed.  Importance of regular physical activity was discussed.   The importance of adherence to medication as well as a controlled low starch/sugar diet was also discussed.  Standard follow-up visit recommended.  Also patient aware failure to keep diabetes under control increases the risk of complications.

## 2018-05-07 NOTE — Telephone Encounter (Signed)
[facility] called stating Dr. Wolfgang Phoenix is requesting to speak with [facility] or our PA regarding this patient. I will provide cell # to [facility].

## 2018-05-08 LAB — BASIC METABOLIC PANEL
BUN/Creatinine Ratio: 18 (ref 12–28)
BUN: 14 mg/dL (ref 8–27)
CALCIUM: 9.8 mg/dL (ref 8.7–10.3)
CO2: 23 mmol/L (ref 20–29)
CREATININE: 0.77 mg/dL (ref 0.57–1.00)
Chloride: 103 mmol/L (ref 96–106)
GFR calc Af Amer: 92 mL/min/{1.73_m2} (ref >59)
GFR, EST NON AFRICAN AMERICAN: 80 mL/min/{1.73_m2} (ref >59)
Glucose: 100 mg/dL — ABNORMAL HIGH (ref 65–99)
Potassium: 3.5 mmol/L (ref 3.5–5.2)
Sodium: 145 mmol/L — ABNORMAL HIGH (ref 134–144)

## 2018-05-08 LAB — HEPATIC FUNCTION PANEL
ALT: 32 IU/L (ref 0–32)
AST: 27 IU/L (ref 0–40)
Albumin: 4 g/dL (ref 3.8–4.8)
Alkaline Phosphatase: 95 IU/L (ref 39–117)
BILIRUBIN TOTAL: 0.2 mg/dL (ref 0.0–1.2)
BILIRUBIN, DIRECT: 0.08 mg/dL (ref 0.00–0.40)
TOTAL PROTEIN: 6.5 g/dL (ref 6.0–8.5)

## 2018-05-08 LAB — HEMOGLOBIN A1C
ESTIMATED AVERAGE GLUCOSE: 131 mg/dL
Hgb A1c MFr Bld: 6.2 % — ABNORMAL HIGH (ref 4.8–5.6)

## 2018-05-08 LAB — MICROALBUMIN / CREATININE URINE RATIO
Creatinine, Urine: 212.9 mg/dL
Microalb/Creat Ratio: 10 mg/g creat (ref 0–29)
Microalbumin, Urine: 21.7 ug/mL

## 2018-05-08 LAB — LIPID PANEL
CHOLESTEROL TOTAL: 75 mg/dL — AB (ref 100–199)
Chol/HDL Ratio: 3 ratio (ref 0.0–4.4)
HDL: 25 mg/dL — ABNORMAL LOW (ref >39)
LDL CALC: 38 mg/dL (ref 0–99)
Triglycerides: 58 mg/dL (ref 0–149)
VLDL CHOLESTEROL CAL: 12 mg/dL (ref 5–40)

## 2018-05-10 ENCOUNTER — Encounter: Payer: Self-pay | Admitting: Family Medicine

## 2018-05-23 ENCOUNTER — Other Ambulatory Visit: Payer: Self-pay

## 2018-05-23 ENCOUNTER — Ambulatory Visit (HOSPITAL_COMMUNITY)
Admission: RE | Admit: 2018-05-23 | Discharge: 2018-05-23 | Disposition: A | Discharge status: Home / Self Care | Payer: Medicare Other | Source: Ambulatory Visit | Attending: Family Medicine | Admitting: Family Medicine

## 2018-05-23 DIAGNOSIS — K5790 Diverticulosis of intestine, part unspecified, without perforation or abscess without bleeding: Secondary | ICD-10-CM | POA: Diagnosis not present

## 2018-05-23 DIAGNOSIS — N200 Calculus of kidney: Secondary | ICD-10-CM | POA: Diagnosis not present

## 2018-05-23 DIAGNOSIS — R1084 Generalized abdominal pain: Secondary | ICD-10-CM | POA: Insufficient documentation

## 2018-05-23 MED ORDER — IOHEXOL 300 MG/ML  SOLN
100.0000 mL | Freq: Once | INTRAMUSCULAR | Status: AC | PRN
  Administered 2018-05-23: 100 mL via INTRAVENOUS

## 2018-05-25 ENCOUNTER — Other Ambulatory Visit: Payer: Self-pay | Admitting: Family Medicine

## 2018-05-26 ENCOUNTER — Other Ambulatory Visit: Payer: Self-pay | Admitting: Family Medicine

## 2018-05-26 DIAGNOSIS — E785 Hyperlipidemia, unspecified: Secondary | ICD-10-CM

## 2018-06-14 ENCOUNTER — Other Ambulatory Visit: Payer: Self-pay | Admitting: Family Medicine

## 2018-11-05 ENCOUNTER — Telehealth: Payer: Self-pay | Admitting: Family Medicine

## 2018-11-05 DIAGNOSIS — I10 Essential (primary) hypertension: Secondary | ICD-10-CM

## 2018-11-05 DIAGNOSIS — Z79899 Other long term (current) drug therapy: Secondary | ICD-10-CM

## 2018-11-05 DIAGNOSIS — E119 Type 2 diabetes mellitus without complications: Secondary | ICD-10-CM

## 2018-11-05 DIAGNOSIS — E785 Hyperlipidemia, unspecified: Secondary | ICD-10-CM

## 2018-11-05 NOTE — Telephone Encounter (Signed)
Orders put in and pt notified.  

## 2018-11-05 NOTE — Telephone Encounter (Signed)
Last labs on 05/06/18. Lipid, liver, a1c, bmp, microalb urine

## 2018-11-05 NOTE — Telephone Encounter (Signed)
Lipid, liver, metabolic 7, 123456 Diabetes hypertension hyperlipidemia

## 2018-11-05 NOTE — Telephone Encounter (Signed)
appt on 11-12-18, she said she usually has labwork done in advance and wants an order put in for [facility] to have bloodwork done.

## 2018-11-07 DIAGNOSIS — I10 Essential (primary) hypertension: Secondary | ICD-10-CM | POA: Diagnosis not present

## 2018-11-07 DIAGNOSIS — Z79899 Other long term (current) drug therapy: Secondary | ICD-10-CM | POA: Diagnosis not present

## 2018-11-07 DIAGNOSIS — E785 Hyperlipidemia, unspecified: Secondary | ICD-10-CM | POA: Diagnosis not present

## 2018-11-07 DIAGNOSIS — E119 Type 2 diabetes mellitus without complications: Secondary | ICD-10-CM | POA: Diagnosis not present

## 2018-11-08 LAB — BASIC METABOLIC PANEL
BUN/Creatinine Ratio: 27 (ref 12–28)
BUN: 19 mg/dL (ref 8–27)
CO2: 22 mmol/L (ref 20–29)
Calcium: 10 mg/dL (ref 8.7–10.3)
Chloride: 104 mmol/L (ref 96–106)
Creatinine, Ser: 0.71 mg/dL (ref 0.57–1.00)
GFR calc Af Amer: 100 mL/min/{1.73_m2} (ref >59)
GFR calc non Af Amer: 87 mL/min/{1.73_m2} (ref >59)
Glucose: 115 mg/dL — ABNORMAL HIGH (ref 65–99)
Potassium: 4.4 mmol/L (ref 3.5–5.2)
Sodium: 142 mmol/L (ref 134–144)

## 2018-11-08 LAB — LIPID PANEL
Chol/HDL Ratio: 3.4 ratio (ref 0.0–4.4)
Cholesterol, Total: 136 mg/dL (ref 100–199)
HDL: 40 mg/dL (ref >39)
LDL Chol Calc (NIH): 80 mg/dL (ref 0–99)
Triglycerides: 79 mg/dL (ref 0–149)
VLDL Cholesterol Cal: 16 mg/dL (ref 5–40)

## 2018-11-08 LAB — HEPATIC FUNCTION PANEL
ALT: 28 IU/L (ref 0–32)
AST: 35 IU/L (ref 0–40)
Albumin: 4 g/dL (ref 3.8–4.8)
Alkaline Phosphatase: 109 IU/L (ref 39–117)
Bilirubin Total: 0.5 mg/dL (ref 0.0–1.2)
Bilirubin, Direct: 0.09 mg/dL (ref 0.00–0.40)
Total Protein: 7.3 g/dL (ref 6.0–8.5)

## 2018-11-08 LAB — HEMOGLOBIN A1C
Est. average glucose Bld gHb Est-mCnc: 131 mg/dL
Hgb A1c MFr Bld: 6.2 % — ABNORMAL HIGH (ref 4.8–5.6)

## 2018-11-12 ENCOUNTER — Ambulatory Visit (INDEPENDENT_AMBULATORY_CARE_PROVIDER_SITE_OTHER): Payer: Medicare Other | Admitting: Family Medicine

## 2018-11-12 ENCOUNTER — Other Ambulatory Visit: Payer: Self-pay

## 2018-11-12 VITALS — BP 134/72 | Temp 96.7°F | Ht 59.0 in | Wt 199.0 lb

## 2018-11-12 DIAGNOSIS — I10 Essential (primary) hypertension: Secondary | ICD-10-CM | POA: Diagnosis not present

## 2018-11-12 DIAGNOSIS — Z23 Encounter for immunization: Secondary | ICD-10-CM

## 2018-11-12 MED ORDER — ENALAPRIL-HYDROCHLOROTHIAZIDE 10-25 MG PO TABS: 1.0000 | ORAL_TABLET | Freq: Every day | ORAL | 1 refills | Status: DC

## 2018-11-12 MED ORDER — HYDROCHLOROTHIAZIDE 25 MG PO TABS: ORAL_TABLET | ORAL | 1 refills | Status: DC

## 2018-11-12 MED ORDER — METFORMIN HCL 500 MG PO TABS: ORAL_TABLET | ORAL | 1 refills | Status: DC

## 2018-11-12 MED ORDER — ROSUVASTATIN CALCIUM 40 MG PO TABS: 40.0000 mg | ORAL_TABLET | Freq: Every day | ORAL | 1 refills | Status: DC

## 2018-11-12 NOTE — Progress Notes (Signed)
Subjective:    Patient ID: [name], female    DOB: 1950/12/24, 68 y.o.   MRN: [id number]  Diabetes She presents for her follow-up diabetic visit. She has type 2 diabetes mellitus. Pertinent negatives for hypoglycemia include no confusion or dizziness. Pertinent negatives for diabetes include no chest pain, no fatigue, no polydipsia, no polyphagia and no weakness. Current diabetic treatments: metformin  She is compliant with treatment all of the time. Home blood sugar record trend: does not check blood sugar. Eye exam current: pt is not sure when she last went. last year or year before.   started walking in May 2 miles each day and got up to 5 miles daily when she hurt her right knee walking last week.  A1C done on bloodwork 6.2 Patient relates right knee pain discomfort soreness denies any swelling.  States energy level overall has been fairly good but recently has had a flareup of her knee  Patient for blood pressure check up.  The patient does have hypertension.  The patient is on medication.  Patient relates compliance with meds. Todays BP reviewed with the patient. Patient denies issues with medication. Patient relates reasonable diet. Patient tries to minimize salt. Patient aware of BP goals. She relates blood pressure under good control takes her medicine on a regular basis  Patient here for follow-up regarding cholesterol.  The patient does have hyperlipidemia.  Patient does try to maintain a reasonable diet.  Patient does take the medication on a regular basis.  Denies missing a dose.  The patient denies any obvious side effects.  Prior blood work results reviewed with the patient.  The patient is aware of his cholesterol goals and the need to keep it under good control to lessen the risk of disease. Previous labs reviewed LDL not at goal therefore switch over to Crestor Review of Systems  Constitutional: Negative for activity change, appetite change and fatigue.  HENT: Negative  for congestion and rhinorrhea.   Respiratory: Negative for cough and shortness of breath.   Cardiovascular: Negative for chest pain and leg swelling.  Gastrointestinal: Negative for abdominal pain and diarrhea.  Endocrine: Negative for polydipsia and polyphagia.  Skin: Negative for color change.  Neurological: Negative for dizziness and weakness.  Psychiatric/Behavioral: Negative for behavioral problems and confusion.       Objective:   Physical Exam Vitals signs reviewed.  Constitutional:      General: She is not in acute distress. HENT:     Head: Normocephalic and atraumatic.  Eyes:     General:        Right eye: No discharge.        Left eye: No discharge.  Neck:     Trachea: No tracheal deviation.  Cardiovascular:     Rate and Rhythm: Normal rate and regular rhythm.     Heart sounds: Normal heart sounds. No murmur.  Pulmonary:     Effort: Pulmonary effort is normal. No respiratory distress.     Breath sounds: Normal breath sounds.  Lymphadenopathy:     Cervical: No cervical adenopathy.  Skin:    General: Skin is warm and dry.  Neurological:     Mental Status: She is alert.     Coordination: Coordination normal.  Psychiatric:        Behavior: Behavior normal.           Assessment & Plan:  [name] recommended  Diabetes under good control recheck again then this spring continue medication watch diet  Knee pain will be seeing orthopedics more than likely overuse injury that may be doing a shot  Blood pressure good control continue current measures watch diet closely  Hyperlipidemia switch over to Crestor to get better control recheck labs in the spring notify us if any problems  Morbid obesity watch portion stay physically active continue try to bring weight down

## 2018-11-12 NOTE — Patient Instructions (Signed)
Shingrix and shingles prevention: know the facts!   Shingrix is a very effective vaccine to prevent shingles.   Shingles is a reactivation of chickenpox -more than 99% of Americans born before 1980 have had chickenpox even if they do not remember it. One in every 10 people who get shingles have severe long-lasting nerve pain as a result.   33 out of a 100 older adults will get shingles if they are unvaccinated.     This vaccine is very important for your health This vaccine is indicated for anyone 50 years or older. You can get this vaccine even if you have already had shingles because you can get the disease more than once in a lifetime.  Your risk for shingles and its complications increases with age.  This vaccine has 2 doses.  The second dose would be 2 to 6 months after the first dose.  If you had Zostavax vaccine in the past you should still get Shingrix. ( Zostavax is only 70% effective and it loses significant strength over a few years .)  This vaccine is given through the pharmacy.  The cost of the vaccine is through your insurance. The pharmacy can inform you of the total costs.  Common side effects including soreness in the arm, some redness and swelling, also some feel fatigue muscle soreness headache low-grade fever.  Side effects typically go away within 2 to 3 days. Remember-the pain from shingles can last a lifetime but these side effects of the vaccine will only last a few days at most. It is very important to get both doses in order to protect yourself fully.   Please get this vaccine at your earliest convenience at your trusted pharmacy.      Results for orders placed or performed in visit on 11/05/18  Lipid panel  Result Value Ref Range   Cholesterol, Total 136 100 - 199 mg/dL   Triglycerides 79 0 - 149 mg/dL   HDL 40 >39 mg/dL   VLDL Cholesterol Cal 16 5 - 40 mg/dL   LDL Chol Calc (NIH) 80 0 - 99 mg/dL   Chol/HDL Ratio 3.4 0.0 - 4.4 ratio  Hepatic  function panel  Result Value Ref Range   Total Protein 7.3 6.0 - 8.5 g/dL   Albumin 4.0 3.8 - 4.8 g/dL   Bilirubin Total 0.5 0.0 - 1.2 mg/dL   Bilirubin, Direct 0.09 0.00 - 0.40 mg/dL   Alkaline Phosphatase 109 39 - 117 IU/L   AST 35 0 - 40 IU/L   ALT 28 0 - 32 IU/L  Basic metabolic panel  Result Value Ref Range   Glucose 115 (H) 65 - 99 mg/dL   BUN 19 8 - 27 mg/dL   Creatinine, Ser 0.71 0.57 - 1.00 mg/dL   GFR calc non Af Amer 87 >59 mL/min/1.73   GFR calc Af Amer 100 >59 mL/min/1.73   BUN/Creatinine Ratio 27 12 - 28   Sodium 142 134 - 144 mmol/L   Potassium 4.4 3.5 - 5.2 mmol/L   Chloride 104 96 - 106 mmol/L   CO2 22 20 - 29 mmol/L   Calcium 10.0 8.7 - 10.3 mg/dL  Hemoglobin A1c  Result Value Ref Range   Hgb A1c MFr Bld 6.2 (H) 4.8 - 5.6 %   Est. average glucose Bld gHb Est-mCnc 131 mg/dL

## 2018-11-14 DIAGNOSIS — M25561 Pain in right knee: Secondary | ICD-10-CM | POA: Diagnosis not present

## 2018-11-15 ENCOUNTER — Other Ambulatory Visit: Payer: Self-pay | Admitting: *Deleted

## 2018-11-15 DIAGNOSIS — Z20822 Contact with and (suspected) exposure to covid-19: Secondary | ICD-10-CM

## 2018-11-15 DIAGNOSIS — R6889 Other general symptoms and signs: Secondary | ICD-10-CM | POA: Diagnosis not present

## 2018-11-16 LAB — NOVEL CORONAVIRUS, NAA: SARS-CoV-2, NAA: NOT DETECTED

## 2019-03-10 ENCOUNTER — Other Ambulatory Visit: Payer: Self-pay

## 2019-03-10 ENCOUNTER — Ambulatory Visit: Payer: Medicare Other | Attending: Internal Medicine

## 2019-03-10 DIAGNOSIS — Z20822 Contact with and (suspected) exposure to covid-19: Secondary | ICD-10-CM

## 2019-03-11 LAB — NOVEL CORONAVIRUS, NAA: SARS-CoV-2, NAA: NOT DETECTED

## 2019-04-05 ENCOUNTER — Ambulatory Visit: Payer: Medicare Other | Attending: Internal Medicine

## 2019-04-05 DIAGNOSIS — Z23 Encounter for immunization: Secondary | ICD-10-CM | POA: Insufficient documentation

## 2019-04-05 NOTE — Progress Notes (Signed)
   Covid-19 Vaccination Clinic  Name:  [name]    MRN: [id number] DOB: 1950/02/28  04/05/2019  [name] was observed post Covid-19 immunization for 15 minutes without incidence. She was provided with Vaccine Information Sheet and instruction to access the V-Safe system.   [name] was instructed to call 911 with any severe reactions post vaccine: Marland Kitchen Difficulty breathing  . Swelling of your face and throat  . A fast heartbeat  . A bad rash all over your body  . Dizziness and weakness    Immunizations Administered    Name Date Dose VIS Date Route   Moderna COVID-19 Vaccine 04/05/2019 10:36 AM 0.5 mL 01/28/2019 Intramuscular   Manufacturer: Moderna   Lot: [id number]   [id number]

## 2019-04-10 ENCOUNTER — Other Ambulatory Visit: Payer: Self-pay

## 2019-04-10 ENCOUNTER — Ambulatory Visit (INDEPENDENT_AMBULATORY_CARE_PROVIDER_SITE_OTHER): Payer: Medicare Other | Admitting: Family Medicine

## 2019-04-10 ENCOUNTER — Encounter: Payer: Self-pay | Admitting: Family Medicine

## 2019-04-10 VITALS — BP 122/74 | Temp 97.4°F | Ht 59.0 in | Wt 201.0 lb

## 2019-04-10 DIAGNOSIS — E785 Hyperlipidemia, unspecified: Secondary | ICD-10-CM

## 2019-04-10 DIAGNOSIS — R809 Proteinuria, unspecified: Secondary | ICD-10-CM

## 2019-04-10 DIAGNOSIS — I10 Essential (primary) hypertension: Secondary | ICD-10-CM | POA: Diagnosis not present

## 2019-04-10 DIAGNOSIS — Z79899 Other long term (current) drug therapy: Secondary | ICD-10-CM | POA: Diagnosis not present

## 2019-04-10 DIAGNOSIS — R82998 Other abnormal findings in urine: Secondary | ICD-10-CM | POA: Diagnosis not present

## 2019-04-10 DIAGNOSIS — E119 Type 2 diabetes mellitus without complications: Secondary | ICD-10-CM

## 2019-04-10 LAB — POCT URINALYSIS DIPSTICK
Protein, UA: POSITIVE — AB
Spec Grav, UA: >=1.03 — AB (ref 1.010–1.025)
pH, UA: 5 (ref 5.0–8.0)

## 2019-04-10 MED ORDER — ROSUVASTATIN CALCIUM 40 MG PO TABS: 40.0000 mg | ORAL_TABLET | Freq: Every day | ORAL | 1 refills | Status: DC

## 2019-04-10 MED ORDER — ENALAPRIL-HYDROCHLOROTHIAZIDE 10-25 MG PO TABS: 1.0000 | ORAL_TABLET | Freq: Every day | ORAL | 1 refills | Status: DC

## 2019-04-10 MED ORDER — METFORMIN HCL 500 MG PO TABS: ORAL_TABLET | ORAL | 1 refills | Status: DC

## 2019-04-10 NOTE — Progress Notes (Signed)
Subjective:    Patient ID: [name], female    DOB: [birth year], 69 y.o.   MRN: [id number]  HPIpt   She relates foamy urine she relates bubbles when she urinates she denies any hematuria flank pain abdominal pain.  No nausea vomiting diarrhea She states her sugars been under decent control she does take her medicines on a regular basis As best she knows her blood pressure is doing good but she has not been doing much in the way of regular exercise because of the weather She does try to watch her diet to some degree Denies any major setbacks recently See discussion below    Results for orders placed or performed in visit on 04/10/19  POCT urinalysis dipstick  Result Value Ref Range   Color, UA     Clarity, UA     Glucose, UA     Bilirubin, UA     Ketones, UA     Spec Grav, UA >=1.030 (A) 1.010 - 1.025   Blood, UA     pH, UA 5.0 5.0 - 8.0   Protein, UA Positive (A) Negative   Urobilinogen, UA     Nitrite, UA     Leukocytes, UA     Appearance     Odor        Review of Systems  Constitutional: Negative for activity change, appetite change and fatigue.  HENT: Negative for congestion and rhinorrhea.   Respiratory: Negative for cough and shortness of breath.   Cardiovascular: Negative for chest pain and leg swelling.  Gastrointestinal: Negative for abdominal pain and diarrhea.  Endocrine: Negative for polydipsia and polyphagia.  Skin: Negative for color change.  Neurological: Negative for dizziness and weakness.  Psychiatric/Behavioral: Negative for behavioral problems and confusion.       Objective:   Physical Exam Vitals reviewed.  Constitutional:      General: She is not in acute distress. HENT:     Head: Normocephalic and atraumatic.  Eyes:     General:        Right eye: No discharge.        Left eye: No discharge.  Neck:     Trachea: No tracheal deviation.  Cardiovascular:     Rate and Rhythm: Normal rate and regular rhythm.     Heart sounds:  Normal heart sounds. No murmur.  Pulmonary:     Effort: Pulmonary effort is normal. No respiratory distress.     Breath sounds: Normal breath sounds.  Lymphadenopathy:     Cervical: No cervical adenopathy.  Skin:    General: Skin is warm and dry.  Neurological:     Mental Status: She is alert.     Coordination: Coordination normal.  Psychiatric:        Behavior: Behavior normal.   No sign of pedal edema lungs are clear no crackles blood pressure overall good  Urinalysis looked under microscope no RBCs or WBCs but does have moderate proteinuria on urinalysis  30 minutes spent reviewing over records plus also setting up test plus also interpreting urinalysis and discussing with the patient and evaluation of her issue and documentation    Assessment & Plan:  1. Foamy urine So this is concerning for proteinuria.  Certainly she has risk factors for kidney disease.  We need to look into this further with some lab work May need further lab work potentially ultrasound of the kidneys placed potentially 24-hour urine protein and nephrology referral - POCT urinalysis dipstick  2. Essential  hypertension, benign Blood pressure under good control she needs to continue current measures previous urine protein was negative we wait to see what this 1 shows - Basic metabolic panel  3. Type 2 diabetes mellitus without complication, without long-term current use of insulin (HCC) Given her diabetes she states sugars have been under good control we will check A1c continue current measures also check urine ACR - Hemoglobin A1c - Microalbumin / creatinine urine ratio We will go ahead of refills on blood pressure cholesterol medicine as well as diabetes medicine certainly this could change based upon the readings of her test she is to do these soon 4. High risk medication use Liver functions recommended.  Unlikely this has to do with her proteinuria - Hepatic function panel  5. Proteinuria, unspecified  type Proteinuria could be a sign of kidney damage from blood pressure or diabetes but it could also be another issue unknown related.  Needs further looking into. Patient with morbid obesity very important for her to try to watch diet stay physically active she is trying to lose some weight

## 2019-04-11 LAB — MICROALBUMIN / CREATININE URINE RATIO
Creatinine, Urine: 77.4 mg/dL
Microalb/Creat Ratio: 201 mg/g creat — ABNORMAL HIGH (ref 0–29)
Microalbumin, Urine: 155.8 ug/mL

## 2019-04-11 LAB — HEPATIC FUNCTION PANEL
ALT: 16 IU/L (ref 0–32)
AST: 17 IU/L (ref 0–40)
Albumin: 4.8 g/dL (ref 3.8–4.8)
Alkaline Phosphatase: 95 IU/L (ref 39–117)
Bilirubin Total: 0.4 mg/dL (ref 0.0–1.2)
Bilirubin, Direct: 0.13 mg/dL (ref 0.00–0.40)
Total Protein: 7.6 g/dL (ref 6.0–8.5)

## 2019-04-11 LAB — BASIC METABOLIC PANEL
BUN/Creatinine Ratio: 18 (ref 12–28)
BUN: 19 mg/dL (ref 8–27)
CO2: 25 mmol/L (ref 20–29)
Calcium: 10.3 mg/dL (ref 8.7–10.3)
Chloride: 101 mmol/L (ref 96–106)
Creatinine, Ser: 1.04 mg/dL — ABNORMAL HIGH (ref 0.57–1.00)
GFR calc Af Amer: 63 mL/min/{1.73_m2} (ref >59)
GFR calc non Af Amer: 55 mL/min/{1.73_m2} — ABNORMAL LOW (ref >59)
Glucose: 101 mg/dL — ABNORMAL HIGH (ref 65–99)
Potassium: 3.7 mmol/L (ref 3.5–5.2)
Sodium: 141 mmol/L (ref 134–144)

## 2019-04-11 LAB — HEMOGLOBIN A1C
Est. average glucose Bld gHb Est-mCnc: 137 mg/dL
Hgb A1c MFr Bld: 6.4 % — ABNORMAL HIGH (ref 4.8–5.6)

## 2019-04-14 ENCOUNTER — Other Ambulatory Visit: Payer: Self-pay | Admitting: Family Medicine

## 2019-04-14 ENCOUNTER — Encounter: Payer: Self-pay | Admitting: Family Medicine

## 2019-04-14 DIAGNOSIS — R319 Hematuria, unspecified: Secondary | ICD-10-CM

## 2019-04-16 DIAGNOSIS — R319 Hematuria, unspecified: Secondary | ICD-10-CM | POA: Diagnosis not present

## 2019-04-17 LAB — PROTEIN, URINE, 24 HOUR
Protein, 24H Urine: 1179 mg/24 hr — ABNORMAL HIGH (ref 30–150)
Protein, Ur: 117.9 mg/dL

## 2019-04-18 ENCOUNTER — Other Ambulatory Visit: Payer: Self-pay | Admitting: Family Medicine

## 2019-04-18 DIAGNOSIS — R809 Proteinuria, unspecified: Secondary | ICD-10-CM

## 2019-04-18 LAB — CREATININE CLEARANCE, URINE, 24 HOUR
Creatinine, 24H Ur: 1220 mg/24 hr (ref 800–1800)
Creatinine, Urine: 122 mg/dL

## 2019-04-28 ENCOUNTER — Other Ambulatory Visit: Payer: Self-pay

## 2019-04-28 ENCOUNTER — Ambulatory Visit (HOSPITAL_COMMUNITY)
Admission: RE | Admit: 2019-04-28 | Discharge: 2019-04-28 | Disposition: A | Discharge status: Home / Self Care | Payer: Medicare Other | Source: Ambulatory Visit | Attending: Family Medicine | Admitting: Family Medicine

## 2019-04-28 DIAGNOSIS — R809 Proteinuria, unspecified: Secondary | ICD-10-CM | POA: Diagnosis not present

## 2019-04-28 DIAGNOSIS — N200 Calculus of kidney: Secondary | ICD-10-CM | POA: Diagnosis not present

## 2019-04-30 ENCOUNTER — Other Ambulatory Visit: Payer: Self-pay | Admitting: *Deleted

## 2019-04-30 ENCOUNTER — Telehealth: Payer: Self-pay | Admitting: *Deleted

## 2019-04-30 DIAGNOSIS — R809 Proteinuria, unspecified: Secondary | ICD-10-CM

## 2019-04-30 NOTE — Telephone Encounter (Signed)
Appointment on 3/16 has been cancel.

## 2019-04-30 NOTE — Telephone Encounter (Signed)
Pt told me when she called back to get test results that she wanted to cancel her appt in march with dr scott. States her check up was done in February which it was and at that time dr scott told her to follow up in 4 months. States she will call back to schedule visit in June

## 2019-05-02 ENCOUNTER — Other Ambulatory Visit: Payer: Self-pay | Admitting: Family Medicine

## 2019-05-06 ENCOUNTER — Ambulatory Visit: Payer: Medicare Other | Attending: Internal Medicine

## 2019-05-06 DIAGNOSIS — Z23 Encounter for immunization: Secondary | ICD-10-CM | POA: Insufficient documentation

## 2019-05-06 NOTE — Progress Notes (Signed)
   Covid-19 Vaccination Clinic  Name:  ILYN SHORTT    MRN: [id number] DOB: [birth year]  05/06/2019  Ms. Espana was observed post Covid-19 immunization for 15 minutes without incident. She was provided with Vaccine Information Sheet and instruction to access the V-Safe system.   Ms. Sass was instructed to call 911 with any severe reactions post vaccine: Marland Kitchen Difficulty breathing  . Swelling of face and throat  . A fast heartbeat  . A bad rash all over body  . Dizziness and weakness   Immunizations Administered    Name Date Dose VIS Date Route   Moderna COVID-19 Vaccine 05/06/2019 10:33 AM 0.5 mL 01/28/2019 Intramuscular   Manufacturer: Moderna   Lot: [id number]   [id number]

## 2019-05-12 DIAGNOSIS — E785 Hyperlipidemia, unspecified: Secondary | ICD-10-CM | POA: Diagnosis not present

## 2019-05-12 DIAGNOSIS — I129 Hypertensive chronic kidney disease with stage 1 through stage 4 chronic kidney disease, or unspecified chronic kidney disease: Secondary | ICD-10-CM | POA: Diagnosis not present

## 2019-05-12 DIAGNOSIS — N182 Chronic kidney disease, stage 2 (mild): Secondary | ICD-10-CM | POA: Diagnosis not present

## 2019-05-12 DIAGNOSIS — R809 Proteinuria, unspecified: Secondary | ICD-10-CM | POA: Diagnosis not present

## 2019-05-12 DIAGNOSIS — E1122 Type 2 diabetes mellitus with diabetic chronic kidney disease: Secondary | ICD-10-CM | POA: Diagnosis not present

## 2019-05-13 ENCOUNTER — Ambulatory Visit: Payer: Medicare Other | Admitting: Family Medicine

## 2019-05-21 ENCOUNTER — Other Ambulatory Visit: Payer: Self-pay

## 2019-05-21 ENCOUNTER — Ambulatory Visit: Payer: Medicare Other | Attending: Internal Medicine

## 2019-05-21 DIAGNOSIS — Z20822 Contact with and (suspected) exposure to covid-19: Secondary | ICD-10-CM | POA: Diagnosis not present

## 2019-05-22 LAB — SARS-COV-2, NAA 2 DAY TAT

## 2019-05-22 LAB — NOVEL CORONAVIRUS, NAA: SARS-CoV-2, NAA: NOT DETECTED

## 2019-09-04 DIAGNOSIS — E119 Type 2 diabetes mellitus without complications: Secondary | ICD-10-CM | POA: Diagnosis not present

## 2019-10-24 ENCOUNTER — Other Ambulatory Visit: Payer: Self-pay | Admitting: Family Medicine

## 2019-10-24 DIAGNOSIS — Z79899 Other long term (current) drug therapy: Secondary | ICD-10-CM

## 2019-10-24 DIAGNOSIS — I10 Essential (primary) hypertension: Secondary | ICD-10-CM

## 2019-10-24 DIAGNOSIS — E119 Type 2 diabetes mellitus without complications: Secondary | ICD-10-CM

## 2019-10-24 DIAGNOSIS — E785 Hyperlipidemia, unspecified: Secondary | ICD-10-CM

## 2019-10-25 NOTE — Telephone Encounter (Signed)
A1c, lipid, liver, metabolic 7, urine ACR 30-ZSW refill Follow-up within 3 months

## 2019-10-27 DIAGNOSIS — E1122 Type 2 diabetes mellitus with diabetic chronic kidney disease: Secondary | ICD-10-CM | POA: Diagnosis not present

## 2019-10-27 DIAGNOSIS — I129 Hypertensive chronic kidney disease with stage 1 through stage 4 chronic kidney disease, or unspecified chronic kidney disease: Secondary | ICD-10-CM | POA: Diagnosis not present

## 2019-10-27 DIAGNOSIS — E1129 Type 2 diabetes mellitus with other diabetic kidney complication: Secondary | ICD-10-CM | POA: Diagnosis not present

## 2019-10-27 DIAGNOSIS — E785 Hyperlipidemia, unspecified: Secondary | ICD-10-CM | POA: Diagnosis not present

## 2019-10-27 DIAGNOSIS — R809 Proteinuria, unspecified: Secondary | ICD-10-CM | POA: Diagnosis not present

## 2019-10-27 DIAGNOSIS — N182 Chronic kidney disease, stage 2 (mild): Secondary | ICD-10-CM | POA: Diagnosis not present

## 2019-11-04 ENCOUNTER — Other Ambulatory Visit: Payer: Self-pay | Admitting: Family Medicine

## 2019-11-04 DIAGNOSIS — E119 Type 2 diabetes mellitus without complications: Secondary | ICD-10-CM | POA: Diagnosis not present

## 2019-11-04 DIAGNOSIS — I10 Essential (primary) hypertension: Secondary | ICD-10-CM | POA: Diagnosis not present

## 2019-11-04 DIAGNOSIS — Z79899 Other long term (current) drug therapy: Secondary | ICD-10-CM | POA: Diagnosis not present

## 2019-11-04 DIAGNOSIS — E785 Hyperlipidemia, unspecified: Secondary | ICD-10-CM | POA: Diagnosis not present

## 2019-11-05 LAB — LIPID PANEL
Chol/HDL Ratio: 2.8 ratio (ref 0.0–4.4)
Cholesterol, Total: 127 mg/dL (ref 100–199)
HDL: 46 mg/dL (ref >39)
LDL Chol Calc (NIH): 67 mg/dL (ref 0–99)
Triglycerides: 70 mg/dL (ref 0–149)
VLDL Cholesterol Cal: 14 mg/dL (ref 5–40)

## 2019-11-05 LAB — BASIC METABOLIC PANEL
BUN/Creatinine Ratio: 19 (ref 12–28)
BUN: 27 mg/dL (ref 8–27)
CO2: 25 mmol/L (ref 20–29)
Calcium: 10.2 mg/dL (ref 8.7–10.3)
Chloride: 103 mmol/L (ref 96–106)
Creatinine, Ser: 1.39 mg/dL — ABNORMAL HIGH (ref 0.57–1.00)
GFR calc Af Amer: 44 mL/min/{1.73_m2} — ABNORMAL LOW (ref >59)
GFR calc non Af Amer: 38 mL/min/{1.73_m2} — ABNORMAL LOW (ref >59)
Glucose: 124 mg/dL — ABNORMAL HIGH (ref 65–99)
Potassium: 3.8 mmol/L (ref 3.5–5.2)
Sodium: 144 mmol/L (ref 134–144)

## 2019-11-05 LAB — HEPATIC FUNCTION PANEL
ALT: 18 IU/L (ref 0–32)
AST: 16 IU/L (ref 0–40)
Albumin: 4.4 g/dL (ref 3.8–4.8)
Alkaline Phosphatase: 86 IU/L (ref 48–121)
Bilirubin Total: 0.4 mg/dL (ref 0.0–1.2)
Bilirubin, Direct: 0.12 mg/dL (ref 0.00–0.40)
Total Protein: 7.1 g/dL (ref 6.0–8.5)

## 2019-11-05 LAB — MICROALBUMIN / CREATININE URINE RATIO
Creatinine, Urine: 104.8 mg/dL
Microalb/Creat Ratio: 166 mg/g creat — ABNORMAL HIGH (ref 0–29)
Microalbumin, Urine: 174.1 ug/mL

## 2019-11-05 LAB — HEMOGLOBIN A1C
Est. average glucose Bld gHb Est-mCnc: 137 mg/dL
Hgb A1c MFr Bld: 6.4 % — ABNORMAL HIGH (ref 4.8–5.6)

## 2019-11-05 NOTE — Telephone Encounter (Signed)
Unable to reach pt

## 2019-11-06 NOTE — Telephone Encounter (Signed)
Pt not home will try after 3

## 2019-11-06 NOTE — Telephone Encounter (Signed)
Scheduled 10/8

## 2019-11-17 ENCOUNTER — Encounter: Payer: Self-pay | Admitting: *Deleted

## 2019-12-04 ENCOUNTER — Other Ambulatory Visit: Payer: Self-pay | Admitting: Family Medicine

## 2019-12-05 ENCOUNTER — Other Ambulatory Visit: Payer: Self-pay

## 2019-12-05 ENCOUNTER — Ambulatory Visit (INDEPENDENT_AMBULATORY_CARE_PROVIDER_SITE_OTHER): Payer: Medicare Other | Admitting: Family Medicine

## 2019-12-05 VITALS — BP 110/70 | Ht 59.0 in | Wt 191.0 lb

## 2019-12-05 DIAGNOSIS — E0822 Diabetes mellitus due to underlying condition with diabetic chronic kidney disease: Secondary | ICD-10-CM | POA: Diagnosis not present

## 2019-12-05 DIAGNOSIS — E1169 Type 2 diabetes mellitus with other specified complication: Secondary | ICD-10-CM | POA: Insufficient documentation

## 2019-12-05 DIAGNOSIS — N183 Chronic kidney disease, stage 3 unspecified: Secondary | ICD-10-CM | POA: Insufficient documentation

## 2019-12-05 DIAGNOSIS — R7989 Other specified abnormal findings of blood chemistry: Secondary | ICD-10-CM | POA: Diagnosis not present

## 2019-12-05 DIAGNOSIS — Z23 Encounter for immunization: Secondary | ICD-10-CM

## 2019-12-05 DIAGNOSIS — E785 Hyperlipidemia, unspecified: Secondary | ICD-10-CM | POA: Diagnosis not present

## 2019-12-05 DIAGNOSIS — I10 Essential (primary) hypertension: Secondary | ICD-10-CM | POA: Diagnosis not present

## 2019-12-05 NOTE — Progress Notes (Signed)
Subjective:    Patient ID: [name], female    DOB: June 09, 1950, 69 y.o.   MRN: [id number]  Hypertension This is a chronic problem. The current episode started more than 1 year ago. Treatments tried: enalapril/hctz. There are no compliance problems.   Essential hypertension, benign  Type 2 diabetes mellitus without complication, without long-term current use of insulin (HCC)  Hyperlipidemia, unspecified hyperlipidemia type  Elevated serum creatinine - Plan: Basic metabolic panel  Need for vaccination - Plan: Flu Vaccine QUAD High Dose(Fluad)   I am concerned about this patient's creatinine has gone up.  GFR went down.  This was when the patient was not well-hydrated.  Therefore she will get well-hydrated then we will recheck this.  May need to stop Metformin depending on the results.  Importance of keeping diabetes high blood pressure weight under control along with a healthy diet was discussed with the patient Results for orders placed or performed in visit on 10/24/19  Hemoglobin A1c  Result Value Ref Range   Hgb A1c MFr Bld 6.4 (H) 4.8 - 5.6 %   Est. average glucose Bld gHb Est-mCnc 137 mg/dL  Lipid Profile  Result Value Ref Range   Cholesterol, Total 127 100 - 199 mg/dL   Triglycerides 70 0 - 149 mg/dL   HDL 46 >39 mg/dL   VLDL Cholesterol Cal 14 5 - 40 mg/dL   LDL Chol Calc (NIH) 67 0 - 99 mg/dL   Chol/HDL Ratio 2.8 0.0 - 4.4 ratio  Hepatic function panel  Result Value Ref Range   Total Protein 7.1 6.0 - 8.5 g/dL   Albumin 4.4 3.8 - 4.8 g/dL   Bilirubin Total 0.4 0.0 - 1.2 mg/dL   Bilirubin, Direct 0.12 0.00 - 0.40 mg/dL   Alkaline Phosphatase 86 48 - 121 IU/L   AST 16 0 - 40 IU/L   ALT 18 0 - 32 IU/L  Basic Metabolic Panel (BMET)  Result Value Ref Range   Glucose 124 (H) 65 - 99 mg/dL   BUN 27 8 - 27 mg/dL   Creatinine, Ser 1.39 (H) 0.57 - 1.00 mg/dL   GFR calc non Af Amer 38 (L) >59 mL/min/1.73   GFR calc Af Amer 44 (L) >59 mL/min/1.73   BUN/Creatinine  Ratio 19 12 - 28   Sodium 144 134 - 144 mmol/L   Potassium 3.8 3.5 - 5.2 mmol/L   Chloride 103 96 - 106 mmol/L   CO2 25 20 - 29 mmol/L   Calcium 10.2 8.7 - 10.3 mg/dL  Urine Microalbumin w/creat. ratio  Result Value Ref Range   Creatinine, Urine 104.8 Not Estab. mg/dL   Microalbumin, Urine 174.1 Not Estab. ug/mL   Microalb/Creat Ratio 166 (H) 0 - 29 mg/g creat      Review of Systems Please see above    Objective:   Physical Exam Lungs are clear respiratory rate normal heart regular no murmurs extremities no edema diabetic foot exam normal       Assessment & Plan:  1. Essential hypertension, benign Blood pressure good control continue current measures avoid excessive protein in diet keep well-hydrated  2. Type 2 diabetes mellitus without complication, without long-term current use of insulin (HCC) A1c looks good may have to stop Metformin depending on the results of the test  3. Hyperlipidemia, unspecified hyperlipidemia type Cholesterol continue current medications watch diet closely Associated with diabetes as well as obesity 4. Elevated serum creatinine Recheck kidney function under well-hydrated conditions - Basic metabolic panel Patient  encouraged to get mammogram 5. Need for vaccination Flu shot today - Flu Vaccine QUAD High Dose(Fluad) Morbid obesity portion control associated with blood pressure cholesterol and diabetes

## 2019-12-09 DIAGNOSIS — R7989 Other specified abnormal findings of blood chemistry: Secondary | ICD-10-CM | POA: Diagnosis not present

## 2019-12-10 ENCOUNTER — Other Ambulatory Visit: Payer: Self-pay | Admitting: Family Medicine

## 2019-12-10 DIAGNOSIS — R7989 Other specified abnormal findings of blood chemistry: Secondary | ICD-10-CM

## 2019-12-10 LAB — BASIC METABOLIC PANEL
BUN/Creatinine Ratio: 15 (ref 12–28)
BUN: 23 mg/dL (ref 8–27)
CO2: 26 mmol/L (ref 20–29)
Calcium: 9.7 mg/dL (ref 8.7–10.3)
Chloride: 102 mmol/L (ref 96–106)
Creatinine, Ser: 1.54 mg/dL — ABNORMAL HIGH (ref 0.57–1.00)
GFR calc Af Amer: 39 mL/min/{1.73_m2} — ABNORMAL LOW (ref >59)
GFR calc non Af Amer: 34 mL/min/{1.73_m2} — ABNORMAL LOW (ref >59)
Glucose: 88 mg/dL (ref 65–99)
Potassium: 3.1 mmol/L — ABNORMAL LOW (ref 3.5–5.2)
Sodium: 140 mmol/L (ref 134–144)

## 2019-12-10 MED ORDER — AMLODIPINE BESYLATE 5 MG PO TABS: ORAL_TABLET | ORAL | 3 refills | Status: DC

## 2019-12-15 DIAGNOSIS — H25813 Combined forms of age-related cataract, bilateral: Secondary | ICD-10-CM | POA: Diagnosis not present

## 2020-01-01 ENCOUNTER — Other Ambulatory Visit: Payer: Self-pay

## 2020-01-01 ENCOUNTER — Ambulatory Visit (INDEPENDENT_AMBULATORY_CARE_PROVIDER_SITE_OTHER): Payer: Medicare Other | Admitting: Family Medicine

## 2020-01-01 VITALS — BP 122/78 | Temp 97.0°F | Ht 59.0 in | Wt 200.0 lb

## 2020-01-01 DIAGNOSIS — E0822 Diabetes mellitus due to underlying condition with diabetic chronic kidney disease: Secondary | ICD-10-CM | POA: Diagnosis not present

## 2020-01-01 DIAGNOSIS — N183 Chronic kidney disease, stage 3 unspecified: Secondary | ICD-10-CM | POA: Diagnosis not present

## 2020-01-01 DIAGNOSIS — I10 Essential (primary) hypertension: Secondary | ICD-10-CM | POA: Diagnosis not present

## 2020-01-01 NOTE — Progress Notes (Signed)
   Subjective:    Patient ID: [name], female    DOB: 06-18-1950, 68 y.o.   MRN: [id number]  HPI  Patient arrives for a follow up on blood pressure.  Review of Systems  Constitutional: Negative for activity change and appetite change.  HENT: Negative for congestion and rhinorrhea.   Respiratory: Negative for cough and shortness of breath.   Cardiovascular: Negative for chest pain and leg swelling.  Gastrointestinal: Negative for abdominal pain, nausea and vomiting.  Skin: Negative for color change.  Neurological: Negative for dizziness and weakness.  Psychiatric/Behavioral: Negative for agitation and confusion.       Objective:   Physical Exam Vitals reviewed.  Constitutional:      General: She is not in acute distress. HENT:     Head: Normocephalic.  Cardiovascular:     Rate and Rhythm: Normal rate and regular rhythm.     Heart sounds: Normal heart sounds. No murmur heard.   Pulmonary:     Effort: Pulmonary effort is normal.     Breath sounds: Normal breath sounds.  Lymphadenopathy:     Cervical: No cervical adenopathy.  Neurological:     Mental Status: She is alert.  Psychiatric:        Behavior: Behavior normal.    No pedal edema noted Results for orders placed or performed in visit on 83/15/17  Basic metabolic panel  Result Value Ref Range   Glucose 88 65 - 99 mg/dL   BUN 23 8 - 27 mg/dL   Creatinine, Ser 1.54 (H) 0.57 - 1.00 mg/dL   GFR calc non Af Amer 34 (L) >59 mL/min/1.73   GFR calc Af Amer 39 (L) >59 mL/min/1.73   BUN/Creatinine Ratio 15 12 - 28   Sodium 140 134 - 144 mmol/L   Potassium 3.1 (L) 3.5 - 5.2 mmol/L   Chloride 102 96 - 106 mmol/L   CO2 26 20 - 29 mmol/L   Calcium 9.7 8.7 - 10.3 mg/dL         Assessment & Plan:  We will consult with Dr.Coladanato with [facility].  Patient has underlying kidney disease and diabetes No longer able to tolerate Metformin More than likely will be going with paresthesia or Jardiance but will  be touching base with nephrology first Blood pressure good control continue current measures  Glucose readings fairly good she showed [place] multiple readings in the low to mid 100s  We will do follow-up several months after initiating new medicine initiate new medicine after we find out input from nephrology

## 2020-01-04 ENCOUNTER — Telehealth: Payer: Self-pay | Admitting: Family Medicine

## 2020-01-04 DIAGNOSIS — I10 Essential (primary) hypertension: Secondary | ICD-10-CM

## 2020-01-04 NOTE — Telephone Encounter (Signed)
I did speak with the kidney specialist We did discuss Farxiga 5 mg 1 daily I will call patient this week to discuss with her

## 2020-01-07 ENCOUNTER — Other Ambulatory Visit (HOSPITAL_COMMUNITY): Payer: Self-pay | Admitting: Family Medicine

## 2020-01-07 DIAGNOSIS — Z1231 Encounter for screening mammogram for malignant neoplasm of breast: Secondary | ICD-10-CM

## 2020-01-09 ENCOUNTER — Telehealth: Payer: Self-pay

## 2020-01-09 ENCOUNTER — Encounter: Payer: Self-pay | Admitting: Family Medicine

## 2020-01-09 MED ORDER — DAPAGLIFLOZIN PROPANEDIOL 5 MG PO TABS: ORAL_TABLET | ORAL | 4 refills | Status: DC

## 2020-01-09 NOTE — Telephone Encounter (Signed)
Medication sent in and lab orders placed 

## 2020-01-09 NOTE — Telephone Encounter (Signed)
I did speak with nephrology Farxiga recommended.  I spoke with patient regarding this. Please send in [place] 5 mg 1 daily, #30, 4 refills also tell [facility] to discontinue ACE inhibitor and Metformin if they have not already from their drug list (Currently she is on amlodipine and rosuvastatin) We did discuss how the medication works and that she can anticipate increased urination She will do a metabolic 7 in 2 to 3 weeks she is aware of this Patient is aware that the medicine is being sent in she will send [place] update within 2 to 3 weeks time and call if any problems

## 2020-01-09 NOTE — Telephone Encounter (Signed)
Sadly these type of medications are very expensive.  Sometimes insurance companies depending on the drug plan can cover it.  There are no generics. If the patient would like to discuss with nephrologist to see if they have other ideas that would be reasonable-when is her next appointment with nephrology?  In the meantime while she is not on any medicine I would recommend that she check her sugar several times per week in the morning and a few times a week near supper and send [place] the readings in 7 days Then we can give feedback regarding a generic medication to help with the sugar  So in other words based on these readings we can recommend a cheaper medicine to help with the diabetes But as for the medication that helps with diabetes and helps the kidneys unfortunately those are very expensive

## 2020-01-09 NOTE — Telephone Encounter (Signed)
Patient advised per Dr Nicki Reaper: Sadly these type of medications are very expensive.  Sometimes insurance companies depending on the drug plan can cover it.  There are no generics. If the patient would like to discuss with nephrologist to see if they have other ideas that would be reasonable   In the meantime while she is not on any medicine Dr Nicki Reaper would recommend that she check her sugar several times per week in the morning and a few times a week near supper and send [place] the readings in 7 days Then we can give feedback regarding a generic medication to help with the sugar   So in other words based on these readings we can recommend a cheaper medicine to help with the diabetes But as for the medication that helps with diabetes and helps the kidneys unfortunately those are very expensive  Patient verbalized understanding and stated her next appt with nephrology is not for 6 months but she is going to give them a call and see what they recommend and also call her insurance and see if they cover anything and will also check her sugars as advised and call back with readings.

## 2020-01-09 NOTE — Addendum Note (Signed)
Addended by: Vicente Males on: 01/09/2020 09:18 AM   Modules accepted: Orders

## 2020-01-09 NOTE — Telephone Encounter (Signed)
Patient states went to pick up medication dapagliflozin 5mg  too expensive. It was $405 . She want to know is there something cheaper and simple to this. [facility]

## 2020-01-09 NOTE — Addendum Note (Signed)
Addended by: Vicente Males on: 01/09/2020 09:19 AM   Modules accepted: Orders

## 2020-01-09 NOTE — Telephone Encounter (Signed)
Please advise. Thank you

## 2020-01-12 ENCOUNTER — Other Ambulatory Visit: Payer: Self-pay | Admitting: *Deleted

## 2020-01-12 DIAGNOSIS — I10 Essential (primary) hypertension: Secondary | ICD-10-CM

## 2020-01-12 MED ORDER — DAPAGLIFLOZIN PROPANEDIOL 5 MG PO TABS: 5.0000 mg | ORAL_TABLET | Freq: Every day | ORAL | 3 refills | Status: DC

## 2020-01-12 NOTE — Telephone Encounter (Signed)
Nurses please do the following  Farxiga 5 mg 1 daily as directed, #30 with 3 refills please send into her pharmacy please put on the prescription for the prescription to be placed on hold  Please contact [name]  Please have her do a metabolic 7 completed 2 weeks after starting the medication She can ask the pharmacy in January to fill the prescription  And follow-up in 3 to 4 months with [place] follow-up sooner if any problems Keep all follow-ups with kidney doctor  Thanks-Dr. Nicki Reaper

## 2020-01-15 NOTE — H&P (Signed)
Surgical History & Physical  Patient Name: [name] DOB: 08/11/1950  Surgery: Cataract extraction with intraocular lens implant phacoemulsification; Left Eye  Surgeon: Baruch Goldmann MD Surgery Date:  01/30/2020 Pre-Op Date:  01/15/2020  HPI: A 84 Yr. old female patient is referred by Dr Jorja Loa for cataract eval. 1. The patient complains of difficulty when viewing TV, reading closed caption, news scrolls on TV, which began 1 year ago. The left eye is affected. The episode is gradual. The condition's severity increased since last visit. Symptoms occur when the patient is driving, inside and outside. The complaint is associated with glare. Pt states she do not like to drive at night due to glare. This is negatively affecting her quality of life. HPI was performed by Baruch Goldmann .  Medical History: Cataracts Diabetes Elevated cholesterol High Blood Pressure  Review of Systems Negative Allergic/Immunologic Negative Cardiovascular Negative Constitutional Negative Ear, Nose, Mouth & Throat Negative Endocrine Negative Eyes Negative Gastrointestinal Negative Genitourinary Negative Hemotologic/Lymphatic Negative Integumentary Negative Musculoskeletal Negative Neurological Negative Psychiatry Negative Respiratory  Social   Never smoked  Medication Atorvastatin, Enalapril, Hydrochlorothiazide, Metformin,   Sx/Procedures Hysterectomy, Gallbladder Sx,   Drug Allergies   NKDA  History & Physical: Heent:  Cataract, Left eye NECK: supple without bruits LUNGS: lungs clear to auscultation CV: regular rate and rhythm Abdomen: soft and non-tender  Impression & Plan: Assessment: 1.  COMBINED FORMS AGE RELATED CATARACT; Both Eyes (H25.813)  Plan: 1.  Cataract accounts for the patient's decreased vision. This visual impairment is not correctable with a tolerable change in glasses or contact lenses. Cataract surgery with an implantation of a new lens should significantly  improve the visual and functional status of the patient. Discussed all risks, benefits, alternatives, and potential complications. Discussed the procedures and recovery. Patient desires to have surgery. A-scan ordered and performed today for intra-ocular lens calculations. The surgery will be performed in order to improve vision for driving, reading, and for eye examinations. Recommend phacoemulsification with intra-ocular lens. Recommend Dextenza for post-operative pain and inflammation. Left Eye worse - first. Dilates well - shugarcaine by protocol. Consider Symphony lens.

## 2020-01-21 ENCOUNTER — Other Ambulatory Visit: Payer: Self-pay | Admitting: Family Medicine

## 2020-01-26 ENCOUNTER — Ambulatory Visit (HOSPITAL_COMMUNITY)
Admission: RE | Admit: 2020-01-26 | Discharge: 2020-01-26 | Disposition: A | Discharge status: Home / Self Care | Payer: Medicare Other | Source: Ambulatory Visit | Attending: Family Medicine | Admitting: Family Medicine

## 2020-01-26 ENCOUNTER — Other Ambulatory Visit: Payer: Self-pay

## 2020-01-26 DIAGNOSIS — Z1231 Encounter for screening mammogram for malignant neoplasm of breast: Secondary | ICD-10-CM | POA: Diagnosis not present

## 2020-01-26 DIAGNOSIS — H25812 Combined forms of age-related cataract, left eye: Secondary | ICD-10-CM | POA: Diagnosis not present

## 2020-01-26 NOTE — Patient Instructions (Signed)
[name]  01/26/2020     @PREFPERIOPPHARMACY @   Your procedure is scheduled on  01/30/2020  Report to [facility] at  0930  A.M.  Call this number if you have problems the morning of surgery:  [phone]   Remember:  Do not eat or drink after midnight.                          Take these medicines the morning of surgery with A SIP OF WATER  Amlodipine.    Do not wear jewelry, make-up or nail polish.  Do not wear lotions, powders, or perfumes. Please wear deodorant and brush your teeth.  Do not shave 48 hours prior to surgery.  Men may shave face and neck.  Do not bring valuables to the hospital.  [facility] is not responsible for any belongings or valuables.  Contacts, dentures or bridgework may not be worn into surgery.  Leave your suitcase in the car.  After surgery it may be brought to your room.  For patients admitted to the hospital, discharge time will be determined by your treatment team.  Patients discharged the day of surgery will not be allowed to drive home.   Name and phone number of your driver:   family    Special instructions:  DO NOT smoke the day of your procedure.  Please read over the following fact sheets that you were given. Anesthesia Post-op Instructions and Care and Recovery After Surgery      Cataract Surgery, Care After This sheet gives you information about how to care for yourself after your procedure. Your health care provider may also give you more specific instructions. If you have problems or questions, contact your health care provider. What can I expect after the procedure? After the procedure, it is common to have:  Itching.  Discomfort.  Fluid discharge.  Sensitivity to light and to touch.  Bruising in or around the eye.  Mild blurred vision. Follow these instructions at home: Eye care   Do not touch or rub your eyes.  Protect your eyes as told by your health care provider. You may be told to wear a  protective eye shield or sunglasses.  Do not put a contact lens into the affected eye or eyes until your health care provider approves.  Keep the area around your eye clean and dry: ? Avoid swimming. ? Do not allow water to hit you directly in the face while showering. ? Keep soap and shampoo out of your eyes.  Check your eye every day for signs of infection. Watch for: ? Redness, swelling, or pain. ? Fluid, blood, or pus. ? Warmth. ? A bad smell. ? Vision that is getting worse. ? Sensitivity that is getting worse. Activity  Do not drive for 24 hours if you were given a sedative during your procedure.  Avoid strenuous activities, such as playing contact sports, for as long as told by your health care provider.  Do not drive or use heavy machinery until your health care provider approves.  Do not bend or lift heavy objects. Bending increases pressure in the eye. You can walk, climb stairs, and do light household chores.  Ask your health care provider when you can return to work. If you work in a dusty environment, you may be advised to wear protective eyewear for a period of time. General instructions  Take or apply over-the-counter and prescription medicines only  as told by your health care provider. This includes eye drops.  Keep all follow-up visits as told by your health care provider. This is important. Contact a health care provider if:  You have increased bruising around your eye.  You have pain that is not helped with medicine.  You have a fever.  You have redness, swelling, or pain in your eye.  You have fluid, blood, or pus coming from your incision.  Your vision gets worse.  Your sensitivity to light gets worse. Get help right away if:  You have sudden loss of vision.  You see flashes of light or spots (floaters).  You have severe eye pain.  You develop nausea or vomiting. Summary  After your procedure, it is common to have itching, discomfort,  bruising, fluid discharge, or sensitivity to light.  Follow instructions from your health care provider about caring for your eye after the procedure.  Do not rub your eye after the procedure. You may need to wear eye protection or sunglasses. Do not wear contact lenses. Keep the area around your eye clean and dry.  Avoid activities that require a lot of effort. These include playing sports and lifting heavy objects.  Contact a health care provider if you have increased bruising, pain that does not go away, or a fever. Get help right away if you suddenly lose your vision, see flashes of light or spots, or have severe pain in the eye. This information is not intended to replace advice given to you by your health care provider. Make sure you discuss any questions you have with your health care provider. Document Revised: 12/10/2018 Document Reviewed: 08/13/2017 Elsevier Patient Education  [place].

## 2020-01-28 ENCOUNTER — Other Ambulatory Visit: Payer: Self-pay

## 2020-01-28 ENCOUNTER — Encounter (HOSPITAL_COMMUNITY)
Admission: RE | Admit: 2020-01-28 | Discharge: 2020-01-28 | Disposition: A | Discharge status: Home / Self Care | Payer: Medicare Other | Source: Ambulatory Visit | Attending: Ophthalmology | Admitting: Ophthalmology

## 2020-01-28 ENCOUNTER — Other Ambulatory Visit (HOSPITAL_COMMUNITY)
Admission: RE | Admit: 2020-01-28 | Discharge: 2020-01-28 | Disposition: A | Discharge status: Home / Self Care | Payer: Medicare Other | Source: Ambulatory Visit | Attending: Ophthalmology | Admitting: Ophthalmology

## 2020-01-28 DIAGNOSIS — Z01818 Encounter for other preprocedural examination: Secondary | ICD-10-CM | POA: Diagnosis not present

## 2020-01-28 DIAGNOSIS — Z20822 Contact with and (suspected) exposure to covid-19: Secondary | ICD-10-CM | POA: Insufficient documentation

## 2020-01-28 LAB — BASIC METABOLIC PANEL
Anion gap: 6 (ref 5–15)
BUN: 23 mg/dL (ref 8–23)
CO2: 25 mmol/L (ref 22–32)
Calcium: 9.2 mg/dL (ref 8.9–10.3)
Chloride: 107 mmol/L (ref 98–111)
Creatinine, Ser: 1.1 mg/dL — ABNORMAL HIGH (ref 0.44–1.00)
GFR, Estimated: 54 mL/min — ABNORMAL LOW (ref >60)
Glucose, Bld: 109 mg/dL — ABNORMAL HIGH (ref 70–99)
Potassium: 3.3 mmol/L — ABNORMAL LOW (ref 3.5–5.1)
Sodium: 138 mmol/L (ref 135–145)

## 2020-01-28 LAB — HEMOGLOBIN A1C
Hgb A1c MFr Bld: 6.3 % — ABNORMAL HIGH (ref 4.8–5.6)
Mean Plasma Glucose: 134.11 mg/dL

## 2020-01-28 LAB — SARS CORONAVIRUS 2 (TAT 6-24 HRS): SARS Coronavirus 2: NEGATIVE

## 2020-01-30 ENCOUNTER — Encounter (HOSPITAL_COMMUNITY)
Admission: RE | Disposition: A | Discharge status: Home / Self Care | Payer: Self-pay | Source: Home / Self Care | Attending: Ophthalmology

## 2020-01-30 ENCOUNTER — Ambulatory Visit (HOSPITAL_COMMUNITY): Payer: Medicare Other | Admitting: Anesthesiology

## 2020-01-30 ENCOUNTER — Ambulatory Visit (HOSPITAL_COMMUNITY)
Admission: RE | Admit: 2020-01-30 | Discharge: 2020-01-30 | Disposition: A | Discharge status: Home / Self Care | Payer: Medicare Other | Attending: Ophthalmology | Admitting: Ophthalmology

## 2020-01-30 ENCOUNTER — Encounter (HOSPITAL_COMMUNITY): Payer: Self-pay | Admitting: Ophthalmology

## 2020-01-30 DIAGNOSIS — G8918 Other acute postprocedural pain: Secondary | ICD-10-CM | POA: Insufficient documentation

## 2020-01-30 DIAGNOSIS — H25812 Combined forms of age-related cataract, left eye: Secondary | ICD-10-CM | POA: Insufficient documentation

## 2020-01-30 DIAGNOSIS — N189 Chronic kidney disease, unspecified: Secondary | ICD-10-CM | POA: Diagnosis not present

## 2020-01-30 DIAGNOSIS — H5712 Ocular pain, left eye: Secondary | ICD-10-CM | POA: Insufficient documentation

## 2020-01-30 DIAGNOSIS — I129 Hypertensive chronic kidney disease with stage 1 through stage 4 chronic kidney disease, or unspecified chronic kidney disease: Secondary | ICD-10-CM | POA: Diagnosis not present

## 2020-01-30 DIAGNOSIS — Z87891 Personal history of nicotine dependence: Secondary | ICD-10-CM | POA: Diagnosis not present

## 2020-01-30 DIAGNOSIS — E1122 Type 2 diabetes mellitus with diabetic chronic kidney disease: Secondary | ICD-10-CM | POA: Diagnosis not present

## 2020-01-30 LAB — GLUCOSE, CAPILLARY: Glucose-Capillary: 114 mg/dL — ABNORMAL HIGH (ref 70–99)

## 2020-01-30 SURGERY — CATARACT EXTRACTION PHACO AND INTRAOCULAR LENS PLACEMENT (IOC) with placement of Corticosteroid
Anesthesia: Monitor Anesthesia Care | Site: Eye | Laterality: Left

## 2020-01-30 MED ORDER — MIDAZOLAM HCL 2 MG/2ML IJ SOLN
INTRAMUSCULAR | Status: AC
  Filled 2020-01-30: qty 2

## 2020-01-30 MED ORDER — PROVISC 10 MG/ML IO SOLN
INTRAOCULAR | Status: DC | PRN
  Administered 2020-01-30: 0.85 mL via INTRAOCULAR

## 2020-01-30 MED ORDER — LIDOCAINE HCL 3.5 % OP GEL
1.0000 "application " | Freq: Once | OPHTHALMIC | Status: AC
  Administered 2020-01-30: 1 via OPHTHALMIC

## 2020-01-30 MED ORDER — DEXAMETHASONE 0.4 MG OP INST
VAGINAL_INSERT | OPHTHALMIC | Status: DC | PRN
  Administered 2020-01-30: 0.4 mg via OPHTHALMIC

## 2020-01-30 MED ORDER — SODIUM HYALURONATE 23 MG/ML IO SOLN
INTRAOCULAR | Status: DC | PRN
  Administered 2020-01-30: 0.6 mL via INTRAOCULAR

## 2020-01-30 MED ORDER — EPINEPHRINE PF 1 MG/ML IJ SOLN
INTRAOCULAR | Status: DC | PRN
  Administered 2020-01-30: 500 mL

## 2020-01-30 MED ORDER — STERILE WATER FOR IRRIGATION IR SOLN
Status: DC | PRN
  Administered 2020-01-30: 250 mL

## 2020-01-30 MED ORDER — LIDOCAINE HCL (PF) 1 % IJ SOLN
INTRAOCULAR | Status: DC | PRN
  Administered 2020-01-30: 1 mL via OPHTHALMIC

## 2020-01-30 MED ORDER — TETRACAINE HCL 0.5 % OP SOLN
1.0000 [drp] | OPHTHALMIC | Status: AC | PRN
  Administered 2020-01-30 (×3): 1 [drp] via OPHTHALMIC

## 2020-01-30 MED ORDER — MIDAZOLAM HCL 5 MG/5ML IJ SOLN
INTRAMUSCULAR | Status: DC | PRN
  Administered 2020-01-30: 1 mg via INTRAVENOUS

## 2020-01-30 MED ORDER — DEXAMETHASONE 0.4 MG OP INST
VAGINAL_INSERT | OPHTHALMIC | Status: AC
  Filled 2020-01-30: qty 1

## 2020-01-30 MED ORDER — BSS IO SOLN
INTRAOCULAR | Status: DC | PRN
  Administered 2020-01-30: 15 mL via INTRAOCULAR

## 2020-01-30 MED ORDER — PHENYLEPHRINE HCL 2.5 % OP SOLN
1.0000 [drp] | OPHTHALMIC | Status: AC | PRN
  Administered 2020-01-30 (×3): 1 [drp] via OPHTHALMIC

## 2020-01-30 MED ORDER — POVIDONE-IODINE 5 % OP SOLN
OPHTHALMIC | Status: DC | PRN
  Administered 2020-01-30: 1 via OPHTHALMIC

## 2020-01-30 MED ORDER — CYCLOPENTOLATE-PHENYLEPHRINE 0.2-1 % OP SOLN
1.0000 [drp] | OPHTHALMIC | Status: AC | PRN
  Administered 2020-01-30 (×3): 1 [drp] via OPHTHALMIC

## 2020-01-30 MED ORDER — EPINEPHRINE PF 1 MG/ML IJ SOLN
INTRAMUSCULAR | Status: AC
  Filled 2020-01-30: qty 2

## 2020-01-30 SURGICAL SUPPLY — 15 items
CLOTH BEACON ORANGE TIMEOUT ST (SAFETY) ×3 IMPLANT
EYE SHIELD UNIVERSAL CLEAR (GAUZE/BANDAGES/DRESSINGS) ×3 IMPLANT
GLOVE BIOGEL PI IND STRL 7.0 (GLOVE) ×2 IMPLANT
GLOVE BIOGEL PI INDICATOR 7.0 (GLOVE) ×4
GOWN STRL REUS W/ TWL XL LVL3 (GOWN DISPOSABLE) ×1 IMPLANT
GOWN STRL REUS W/TWL XL LVL3 (GOWN DISPOSABLE) ×4
NDL HYPO 18GX1.5 BLUNT FILL (NEEDLE) IMPLANT
NEEDLE HYPO 18GX1.5 BLUNT FILL (NEEDLE) ×4 IMPLANT
PAD ARMBOARD 7.5X6 YLW CONV (MISCELLANEOUS) ×3 IMPLANT
SYR TB 1ML LL NO SAFETY (SYRINGE) ×3 IMPLANT
TAPE SURG TRANSPORE 1 IN (GAUZE/BANDAGES/DRESSINGS) ×1 IMPLANT
TAPE SURGICAL TRANSPORE 1 IN (GAUZE/BANDAGES/DRESSINGS) ×4
TECNIS IOL (Intraocular Lens) ×3 IMPLANT
VISCOELASTIC ADDITIONAL (OPHTHALMIC RELATED) IMPLANT
WATER STERILE IRR 250ML POUR (IV SOLUTION) ×3 IMPLANT

## 2020-01-30 NOTE — Interval H&P Note (Signed)
History and Physical Interval Note:  01/30/2020 9:38 AM  [name]  has presented today for surgery, with the diagnosis of Nuclear sclerotic cataract - Left eye.  The various methods of treatment have been discussed with the patient and family. After consideration of risks, benefits and other options for treatment, the patient has consented to  Procedure(s) with comments: CATARACT EXTRACTION PHACO AND INTRAOCULAR LENS PLACEMENT ([place]) with placement of Corticosteroid (Left) - CDE:  as a surgical intervention.  The patient's history has been reviewed, patient examined, no change in status, stable for surgery.  I have reviewed the patient's chart and labs.  Questions were answered to the patient's satisfaction.     Baruch Goldmann

## 2020-01-30 NOTE — Transfer of Care (Signed)
Immediate Anesthesia Transfer of Care Note  Patient: [name]  Procedure(s) Performed: CATARACT EXTRACTION PHACO AND INTRAOCULAR LENS PLACEMENT ([facility]) with placement of Corticosteroid (Left Eye)  Patient Location: PACU  Anesthesia Type:MAC  Level of Consciousness: awake, alert  and oriented  Airway & Oxygen Therapy: Patient Spontanous Breathing  Post-op Assessment: Report given to RN  Post vital signs: Reviewed and stable  Last Vitals:  Vitals Value Taken Time  BP    Temp    Pulse    Resp    SpO2      Last Pain:  Vitals:   01/30/20 0844  TempSrc: Oral  PainSc: 0-No pain         Complications: No complications documented.

## 2020-01-30 NOTE — Discharge Instructions (Signed)
Please discharge patient when stable, will follow up today with Dr. Zayaan Kozak at the [facility] office immediately following discharge.  Leave shield in place until visit.  All paperwork with discharge instructions will be given at the office.  [facility] Address:  [address]  [place], [place] [address]             Monitored Anesthesia Care, Care After These instructions provide you with information about caring for yourself after your procedure. Your health care provider may also give you more specific instructions. Your treatment has been planned according to current medical practices, but problems sometimes occur. Call your health care provider if you have any problems or questions after your procedure. What can I expect after the procedure? After your procedure, you may:  Feel sleepy for several hours.  Feel clumsy and have poor balance for several hours.  Feel forgetful about what happened after the procedure.  Have poor judgment for several hours.  Feel nauseous or vomit.  Have a sore throat if you had a breathing tube during the procedure. Follow these instructions at home: For at least 24 hours after the procedure:      Have a responsible adult stay with you. It is important to have someone help care for you until you are awake and alert.  Rest as needed.  Do not: ? Participate in activities in which you could fall or become injured. ? Drive. ? Use heavy machinery. ? Drink alcohol. ? Take sleeping pills or medicines that cause drowsiness. ? Make important decisions or sign legal documents. ? Take care of children on your own. Eating and drinking  Follow the diet that is recommended by your health care provider.  If you vomit, drink water, juice, or soup when you can drink without vomiting.  Make sure you have little or no nausea before eating solid foods. General instructions  Take over-the-counter and  prescription medicines only as told by your health care provider.  If you have sleep apnea, surgery and certain medicines can increase your risk for breathing problems. Follow instructions from your health care provider about wearing your sleep device: ? Anytime you are sleeping, including during daytime naps. ? While taking prescription pain medicines, sleeping medicines, or medicines that make you drowsy.  If you smoke, do not smoke without supervision.  Keep all follow-up visits as told by your health care provider. This is important. Contact a health care provider if:  You keep feeling nauseous or you keep vomiting.  You feel light-headed.  You develop a rash.  You have a fever. Get help right away if:  You have trouble breathing. Summary  For several hours after your procedure, you may feel sleepy and have poor judgment.  Have a responsible adult stay with you for at least 24 hours or until you are awake and alert. This information is not intended to replace advice given to you by your health care provider. Make sure you discuss any questions you have with your health care provider. Document Revised: 05/14/2017 Document Reviewed: 06/06/2015 Elsevier Patient Education  2020 Elsevier Inc.  

## 2020-01-30 NOTE — Anesthesia Postprocedure Evaluation (Signed)
Anesthesia Post Note  Patient: [name]  Procedure(s) Performed: CATARACT EXTRACTION PHACO AND INTRAOCULAR LENS PLACEMENT ([facility]) with placement of Corticosteroid (Left Eye)  Patient location during evaluation: Phase II Anesthesia Type: MAC Level of consciousness: awake, oriented and patient cooperative Pain management: pain level controlled Vital Signs Assessment: post-procedure vital signs reviewed and stable Respiratory status: spontaneous breathing Cardiovascular status: stable Postop Assessment: no apparent nausea or vomiting Anesthetic complications: no   No complications documented.   Last Vitals:  Vitals:   01/30/20 0844 01/30/20 1018  BP: (!) 153/76 140/60  Pulse: 83 75  Resp: 17 16  Temp: 36.8 C 36.8 C  SpO2: 98% 100%    Last Pain:  Vitals:   01/30/20 1018  TempSrc: Oral  PainSc: 0-No pain                 Gayland Curry

## 2020-01-30 NOTE — Anesthesia Preprocedure Evaluation (Signed)
Anesthesia Evaluation  Patient identified by MRN, date of birth, ID band Patient awake    Reviewed: Allergy & Precautions, H&P , NPO status , Patient's Chart, lab work & pertinent test results, reviewed documented beta blocker date and time   Airway Mallampati: II  TM Distance: >3 FB Neck ROM: full    Dental no notable dental hx.    Pulmonary sleep apnea , former smoker,    Pulmonary exam normal breath sounds clear to auscultation       Cardiovascular Exercise Tolerance: Good hypertension, negative cardio ROS   Rhythm:regular Rate:Normal     Neuro/Psych  Neuromuscular disease negative psych ROS   GI/Hepatic negative GI ROS, Neg liver ROS,   Endo/Other  negative endocrine ROSdiabetes  Renal/GU CRFRenal disease  negative genitourinary   Musculoskeletal   Abdominal   Peds  Hematology negative hematology ROS (+)   Anesthesia Other Findings   Reproductive/Obstetrics negative OB ROS                             Anesthesia Physical Anesthesia Plan  ASA: III  Anesthesia Plan: MAC   Post-op Pain Management:    Induction:   PONV Risk Score and Plan:   Airway Management Planned:   Additional Equipment:   Intra-op Plan:   Post-operative Plan:   Informed Consent: I have reviewed the patients History and Physical, chart, labs and discussed the procedure including the risks, benefits and alternatives for the proposed anesthesia with the patient or authorized representative who has indicated his/her understanding and acceptance.     Dental Advisory Given  Plan Discussed with: CRNA  Anesthesia Plan Comments:         Anesthesia Quick Evaluation

## 2020-01-30 NOTE — Op Note (Signed)
Date of procedure: 01/30/20  Pre-operative diagnosis: Visually significant age-related combined cataract, Left Eye (H25.812)  Post-operative diagnosis:  1. 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   Pain and inflammation following cataract surgery, Left Eye (H57.12)  Procedure:  1. Removal of cataract via phacoemulsification and insertion of intra-ocular lens Johnson and Eschbach  +22.0D into the capsular bag of the Left Eye 2. Placement of Dextenza Implant, Left Lower Lid  Attending surgeon: Gerda Diss. Conna Terada, MD, MA  Anesthesia: MAC, Topical [place]  Complications: None  Estimated Blood Loss: <66m (minimal)  Specimens: None  Implants: As above  Indications:  Visually significant age-related cataract, Left Eye  Procedure:  The patient was seen and identified in the pre-operative area. The operative eye was identified and dilated.  The operative eye was marked.  Topical anesthesia was administered to the operative eye.     The patient was then to the operative suite and placed in the supine position.  A timeout was performed confirming the patient, procedure to be performed, and all other relevant information.   The patient's face was prepped and draped in the usual fashion for intra-ocular surgery.  A lid speculum was placed into the operative eye and the surgical microscope moved into place and focused.  An inferotemporal paracentesis was created using a 20 gauge paracentesis blade.  Shugarcaine was injected into the anterior chamber.  Viscoelastic was injected into the anterior chamber.  A temporal clear-corneal main wound incision was created using a 2.410mmicrokeratome.  A continuous curvilinear capsulorrhexis was initiated using an irrigating cystitome and completed using capsulorrhexis forceps.  Hydrodissection and hydrodeliniation were performed.  Viscoelastic was injected into the anterior chamber.  A phacoemulsification handpiece and a  chopper as a second instrument were used to remove the nucleus and epinucleus. The irrigation/aspiration handpiece was used to remove any remaining cortical material.   The capsular bag was reinflated with viscoelastic, checked, and found to be intact.  The intraocular lens was inserted into the capsular bag.  The irrigation/aspiration handpiece was used to remove any remaining viscoelastic.  The clear corneal wound and paracentesis wounds were then hydrated and checked with Weck-Cels to be watertight.  The lid-speculum was removed.    A Dextenza implant was placed in the lower canaliculus.   The drape was removed.  The patient's face was cleaned with a wet and dry 4x4.   A clear shield was taped over the eye. The patient was taken to the post-operative care unit in good condition, having tolerated the procedure well.  Post-Op Instructions: [facility] and will receive all other orders and instructions.

## 2020-02-05 ENCOUNTER — Ambulatory Visit: Payer: Medicare Other | Attending: Internal Medicine

## 2020-02-05 DIAGNOSIS — Z23 Encounter for immunization: Secondary | ICD-10-CM

## 2020-02-05 NOTE — Progress Notes (Signed)
   Covid-19 Vaccination Clinic  Name:  ANN BOHNE    MRN: [id number] DOB: 09/05/1950  02/05/2020  Ms. Balon was observed post Covid-19 immunization for 15 minutes without incident. She was provided with Vaccine Information Sheet and instruction to access the V-Safe system.   Ms. Dehne was instructed to call 911 with any severe reactions post vaccine: Marland Kitchen Difficulty breathing  . Swelling of face and throat  . A fast heartbeat  . A bad rash all over body  . Dizziness and weakness   Immunizations Administered    No immunizations on file.

## 2020-02-06 NOTE — H&P (Signed)
Surgical History & Physical  Patient Name: [name] DOB: January 25, 1951  Surgery: Cataract extraction with intraocular lens implant phacoemulsification; Right Eye  Surgeon: Baruch Goldmann MD Surgery Date:  02/13/2020 Pre-Op Date:  02/05/2020  HPI: A 44 Yr. old female patient The patient is returning after [place] cataract surgery. The left eye is affected. Status post cataract surgery, which began 1 week ago: Since the last visit, the affected area feels improvement and is doing well. The patient's vision is improved and stable. Patient is following medication instructions. Omni TID OS.The patient experiences no pain or increase in floaters. The patient complains of difficulty when viewing TV, reading closed caption, news scrolls on TV, which began 1 year ago. The right eye is affected. The episode is gradual. The condition's severity increased since last visit. Symptoms occur when the patient is driving, inside and outside. The complaint is associated with glare. Pt states she do not like to drive at night due to glare. This is negatively affecting her quality of life. HPI Completed by Dr. Baruch Goldmann  Medical History: Cataracts Diabetes Elevated cholesterol High Blood Pressure  Review of Systems Negative Allergic/Immunologic Negative Cardiovascular Negative Constitutional Negative Ear, Nose, Mouth & Throat Negative Endocrine Negative Eyes Negative Gastrointestinal Negative Genitourinary Negative Hemotologic/Lymphatic Negative Integumentary Negative Musculoskeletal Negative Neurological Negative Psychiatry Negative Respiratory  Social   Never smoked   Medication Prednisolone-gatiflox-bromfenac,  Atorvastatin, Enalapril, Hydrochlorothiazide, Metformin,   Sx/Procedures Phaco c IOL OS with Dextenza,  Hysterectomy, Gallbladder Sx,   Drug Allergies   NKDA  History & Physical: Heent:  Cataract, Right eye NECK: supple without bruits LUNGS: lungs clear to  auscultation CV: regular rate and rhythm Abdomen: soft and non-tender  Impression & Plan: Assessment: 1.  CATARACT EXTRACTION STATUS; Left Eye (Z98.42) 2.  INTRAOCULAR LENS IOL (Z96.1) 3.  COMBINED FORMS AGE RELATED CATARACT; Right Eye (H25.811) 4.  BLEPHARITIS; Right Upper Lid, Right Lower Lid, Left Upper Lid, Left Lower Lid (H01.001, H01.002,H01.004,H01.005) 5.  Pinguecula; Both Eyes (H11.153) 6.  ARCUS SENILIS; Both Eyes (H18.413) 7.  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  1 week after cataract surgery. Doing well with improved vision and normal eye pressure. Call with any problems or concerns. Stop all drops. Call with any concerning symptoms. 2.  Doing well since surgery 3.  Cataract accounts for the patient's decreased vision. This visual impairment is not correctable with a tolerable change in glasses or contact lenses. Cataract surgery with an implantation of a new lens should significantly improve the visual and functional status of the patient. Discussed all risks, benefits, alternatives, and potential complications. Discussed the procedures and recovery. Patient desires to have surgery. A-scan ordered and performed today for intra-ocular lens calculations. The surgery will be performed in order to improve vision for driving, reading, and for eye examinations. Recommend phacoemulsification with intra-ocular lens. Recommend Dextenza for post-operative pain and inflammation. Right Eye. Surgery required to correct imbalance of vision. Dilates well - shugarcaine by protocol. 4.  recommend regular lid cleaning. 5.  Observe; Artificial tears as needed for irritation. 6.  Discussed significance of finding 7.  Continue to monitor.

## 2020-02-09 DIAGNOSIS — H25811 Combined forms of age-related cataract, right eye: Secondary | ICD-10-CM | POA: Diagnosis not present

## 2020-02-11 ENCOUNTER — Encounter (HOSPITAL_COMMUNITY): Payer: Self-pay

## 2020-02-11 ENCOUNTER — Other Ambulatory Visit (HOSPITAL_COMMUNITY)
Admission: RE | Admit: 2020-02-11 | Discharge: 2020-02-11 | Disposition: A | Discharge status: Home / Self Care | Payer: Medicare Other | Source: Ambulatory Visit | Attending: Ophthalmology | Admitting: Ophthalmology

## 2020-02-11 ENCOUNTER — Other Ambulatory Visit: Payer: Self-pay

## 2020-02-11 ENCOUNTER — Encounter (HOSPITAL_COMMUNITY)
Admission: RE | Admit: 2020-02-11 | Discharge: 2020-02-11 | Disposition: A | Discharge status: Home / Self Care | Payer: Medicare Other | Source: Ambulatory Visit | Attending: Ophthalmology | Admitting: Ophthalmology

## 2020-02-11 DIAGNOSIS — Z01812 Encounter for preprocedural laboratory examination: Secondary | ICD-10-CM | POA: Diagnosis not present

## 2020-02-11 DIAGNOSIS — Z20822 Contact with and (suspected) exposure to covid-19: Secondary | ICD-10-CM | POA: Diagnosis not present

## 2020-02-12 LAB — SARS CORONAVIRUS 2 (TAT 6-24 HRS): SARS Coronavirus 2: NEGATIVE

## 2020-02-13 ENCOUNTER — Encounter (HOSPITAL_COMMUNITY)
Admission: RE | Disposition: A | Discharge status: Home / Self Care | Payer: Self-pay | Source: Home / Self Care | Attending: Ophthalmology

## 2020-02-13 ENCOUNTER — Ambulatory Visit (HOSPITAL_COMMUNITY): Payer: Medicare Other | Admitting: Anesthesiology

## 2020-02-13 ENCOUNTER — Ambulatory Visit (HOSPITAL_COMMUNITY)
Admission: RE | Admit: 2020-02-13 | Discharge: 2020-02-13 | Disposition: A | Discharge status: Home / Self Care | Payer: Medicare Other | Attending: Ophthalmology | Admitting: Ophthalmology

## 2020-02-13 DIAGNOSIS — Z961 Presence of intraocular lens: Secondary | ICD-10-CM | POA: Diagnosis not present

## 2020-02-13 DIAGNOSIS — H25811 Combined forms of age-related cataract, right eye: Secondary | ICD-10-CM | POA: Diagnosis not present

## 2020-02-13 DIAGNOSIS — Y838 Other surgical procedures as the cause of abnormal reaction of the patient, or of later complication, without mention of misadventure at the time of the procedure: Secondary | ICD-10-CM | POA: Insufficient documentation

## 2020-02-13 DIAGNOSIS — N189 Chronic kidney disease, unspecified: Secondary | ICD-10-CM | POA: Diagnosis not present

## 2020-02-13 DIAGNOSIS — H11153 Pinguecula, bilateral: Secondary | ICD-10-CM | POA: Insufficient documentation

## 2020-02-13 DIAGNOSIS — H59091 Other disorders of the right eye following cataract surgery: Secondary | ICD-10-CM | POA: Insufficient documentation

## 2020-02-13 DIAGNOSIS — H40043 Steroid responder, bilateral: Secondary | ICD-10-CM | POA: Insufficient documentation

## 2020-02-13 DIAGNOSIS — Z7984 Long term (current) use of oral hypoglycemic drugs: Secondary | ICD-10-CM | POA: Insufficient documentation

## 2020-02-13 DIAGNOSIS — Z9842 Cataract extraction status, left eye: Secondary | ICD-10-CM | POA: Diagnosis not present

## 2020-02-13 DIAGNOSIS — E1122 Type 2 diabetes mellitus with diabetic chronic kidney disease: Secondary | ICD-10-CM | POA: Diagnosis not present

## 2020-02-13 DIAGNOSIS — Z79899 Other long term (current) drug therapy: Secondary | ICD-10-CM | POA: Diagnosis not present

## 2020-02-13 DIAGNOSIS — H5711 Ocular pain, right eye: Secondary | ICD-10-CM | POA: Diagnosis not present

## 2020-02-13 DIAGNOSIS — Z87891 Personal history of nicotine dependence: Secondary | ICD-10-CM | POA: Diagnosis not present

## 2020-02-13 DIAGNOSIS — H0100B Unspecified blepharitis left eye, upper and lower eyelids: Secondary | ICD-10-CM | POA: Diagnosis not present

## 2020-02-13 DIAGNOSIS — H18413 Arcus senilis, bilateral: Secondary | ICD-10-CM | POA: Insufficient documentation

## 2020-02-13 DIAGNOSIS — I129 Hypertensive chronic kidney disease with stage 1 through stage 4 chronic kidney disease, or unspecified chronic kidney disease: Secondary | ICD-10-CM | POA: Diagnosis not present

## 2020-02-13 DIAGNOSIS — H0100A Unspecified blepharitis right eye, upper and lower eyelids: Secondary | ICD-10-CM | POA: Insufficient documentation

## 2020-02-13 SURGERY — CATARACT EXTRACTION PHACO AND INTRAOCULAR LENS PLACEMENT (IOC) with placement of Corticosteroid
Anesthesia: Monitor Anesthesia Care | Site: Eye | Laterality: Right

## 2020-02-13 MED ORDER — POVIDONE-IODINE 5 % OP SOLN
OPHTHALMIC | Status: DC | PRN
  Administered 2020-02-13: 1 via OPHTHALMIC

## 2020-02-13 MED ORDER — LIDOCAINE 3.5 % OP GEL OPTIME - NO CHARGE
OPHTHALMIC | Status: DC | PRN
  Administered 2020-02-13: 08:00:00 1 [drp] via OPHTHALMIC

## 2020-02-13 MED ORDER — DEXAMETHASONE 0.4 MG OP INST
VAGINAL_INSERT | OPHTHALMIC | Status: AC
  Filled 2020-02-13: qty 1

## 2020-02-13 MED ORDER — PROVISC 10 MG/ML IO SOLN
INTRAOCULAR | Status: DC | PRN
  Administered 2020-02-13: 0.85 mL via INTRAOCULAR

## 2020-02-13 MED ORDER — EPINEPHRINE PF 1 MG/ML IJ SOLN
INTRAOCULAR | Status: DC | PRN
  Administered 2020-02-13: 08:00:00 500 mL

## 2020-02-13 MED ORDER — LIDOCAINE HCL 3.5 % OP GEL
1.0000 "application " | Freq: Once | OPHTHALMIC | Status: AC
  Administered 2020-02-13: 1 via OPHTHALMIC

## 2020-02-13 MED ORDER — DEXAMETHASONE 0.4 MG OP INST
VAGINAL_INSERT | OPHTHALMIC | Status: DC | PRN
  Administered 2020-02-13: 0.4 mg via OPHTHALMIC

## 2020-02-13 MED ORDER — SODIUM CHLORIDE 0.9% FLUSH
INTRAVENOUS | Status: DC | PRN
  Administered 2020-02-13: 5 mL via INTRAVENOUS
  Administered 2020-02-13: 3 mL via INTRAVENOUS

## 2020-02-13 MED ORDER — PHENYLEPHRINE HCL 2.5 % OP SOLN
1.0000 [drp] | OPHTHALMIC | Status: AC | PRN
  Administered 2020-02-13 (×3): 1 [drp] via OPHTHALMIC

## 2020-02-13 MED ORDER — MIDAZOLAM HCL 2 MG/2ML IJ SOLN
INTRAMUSCULAR | Status: AC
  Filled 2020-02-13: qty 2

## 2020-02-13 MED ORDER — TETRACAINE HCL 0.5 % OP SOLN
1.0000 [drp] | OPHTHALMIC | Status: AC | PRN
  Administered 2020-02-13 (×3): 1 [drp] via OPHTHALMIC

## 2020-02-13 MED ORDER — EPINEPHRINE PF 1 MG/ML IJ SOLN
INTRAMUSCULAR | Status: AC
  Filled 2020-02-13: qty 2

## 2020-02-13 MED ORDER — LIDOCAINE HCL (PF) 1 % IJ SOLN
INTRAOCULAR | Status: DC | PRN
  Administered 2020-02-13: 08:00:00 1 mL via OPHTHALMIC

## 2020-02-13 MED ORDER — BSS IO SOLN
INTRAOCULAR | Status: DC | PRN
  Administered 2020-02-13: 15 mL via INTRAOCULAR

## 2020-02-13 MED ORDER — MIDAZOLAM HCL 2 MG/2ML IJ SOLN
INTRAMUSCULAR | Status: DC | PRN
  Administered 2020-02-13: 1 mg via INTRAVENOUS
  Administered 2020-02-13: 2 mg via INTRAVENOUS

## 2020-02-13 MED ORDER — CYCLOPENTOLATE-PHENYLEPHRINE 0.2-1 % OP SOLN
1.0000 [drp] | OPHTHALMIC | Status: AC | PRN
  Administered 2020-02-13 (×3): 1 [drp] via OPHTHALMIC

## 2020-02-13 MED ORDER — STERILE WATER FOR IRRIGATION IR SOLN
Status: DC | PRN
  Administered 2020-02-13: 250 mL

## 2020-02-13 MED ORDER — SODIUM HYALURONATE 23 MG/ML IO SOLN
INTRAOCULAR | Status: DC | PRN
  Administered 2020-02-13: 0.6 mL via INTRAOCULAR

## 2020-02-13 SURGICAL SUPPLY — 17 items
CLOTH BEACON ORANGE TIMEOUT ST (SAFETY) ×3 IMPLANT
DEVICE MILOOP (MISCELLANEOUS) IMPLANT
EYE SHIELD UNIVERSAL CLEAR (GAUZE/BANDAGES/DRESSINGS) ×3 IMPLANT
GLOVE BIOGEL PI IND STRL 7.0 (GLOVE) ×3 IMPLANT
GLOVE BIOGEL PI INDICATOR 7.0 (GLOVE) ×6
MILOOP DEVICE (MISCELLANEOUS)
NDL HYPO 18GX1.5 BLUNT FILL (NEEDLE) IMPLANT
NEEDLE HYPO 18GX1.5 BLUNT FILL (NEEDLE) ×4 IMPLANT
PAD ARMBOARD 7.5X6 YLW CONV (MISCELLANEOUS) ×3 IMPLANT
RING MALYGIN (MISCELLANEOUS) IMPLANT
RING MALYGIN 7.0 (MISCELLANEOUS) IMPLANT
SYR TB 1ML LL NO SAFETY (SYRINGE) ×3 IMPLANT
TAPE SURG TRANSPORE 1 IN (GAUZE/BANDAGES/DRESSINGS) ×1 IMPLANT
TAPE SURGICAL TRANSPORE 1 IN (GAUZE/BANDAGES/DRESSINGS) ×4
Tecnis 1-Piece Intraocular Lens (Intraocular Lens) ×3 IMPLANT
VISCOELASTIC ADDITIONAL (OPHTHALMIC RELATED) IMPLANT
WATER STERILE IRR 250ML POUR (IV SOLUTION) ×3 IMPLANT

## 2020-02-13 NOTE — Transfer of Care (Signed)
Immediate Anesthesia Transfer of Care Note  Patient: [name]  Procedure(s) Performed: CATARACT EXTRACTION PHACO AND INTRAOCULAR LENS PLACEMENT (IOC) with placement of Corticosteroid (Right Eye)  Patient Location: Short Stay  Anesthesia Type:MAC  Level of Consciousness: awake, alert  and patient cooperative  Airway & Oxygen Therapy: Patient Spontanous Breathing  Post-op Assessment: Report given to RN and Post -op Vital signs reviewed and stable  Post vital signs: Reviewed and stable  Last Vitals:  Vitals Value Taken Time  BP    Temp    Pulse    Resp    SpO2    SEE VITAL SIGN SHEET  Last Pain:  Vitals:   02/13/20 0737  TempSrc: Oral  PainSc: 0-No pain      Patients Stated Pain Goal: 4 (22/63/33 [id number])  Complications: No complications documented.

## 2020-02-13 NOTE — Discharge Instructions (Addendum)
Please discharge patient when stable, will follow up today with Dr. Wrzosek at the [facility] office immediately following discharge.  Leave shield in place until visit.  All paperwork with discharge instructions will be given at the office.  [facility] Address:  [address]  [place], [place] [address]   PATIENT INSTRUCTIONS POST-ANESTHESIA  IMMEDIATELY FOLLOWING SURGERY:  Do not drive or operate machinery for the first twenty four hours after surgery.  Do not make any important decisions for twenty four hours after surgery or while taking narcotic pain medications or sedatives.  If you develop intractable nausea and vomiting or a severe headache please notify your doctor immediately.  FOLLOW-UP:  Please make an appointment with your surgeon as instructed. You do not need to follow up with anesthesia unless specifically instructed to do so.  WOUND CARE INSTRUCTIONS (if applicable):  Keep a dry clean dressing on the anesthesia/puncture wound site if there is drainage.  Once the wound has quit draining you may leave it open to air.  Generally you should leave the bandage intact for twenty four hours unless there is drainage.  If the epidural site drains for more than 36-48 hours please call the anesthesia department.  QUESTIONS?:  Please feel free to call your physician or the hospital operator if you have any questions, and they will be happy to assist you.       

## 2020-02-13 NOTE — Interval H&P Note (Signed)
History and Physical Interval Note:  02/13/2020 7:54 AM  [name]  has presented today for surgery, with the diagnosis of Nuclear sclerotic cataract - Right eye.  The various methods of treatment have been discussed with the patient and family. After consideration of risks, benefits and other options for treatment, the patient has consented to  Procedure(s) with comments: CATARACT EXTRACTION PHACO AND INTRAOCULAR LENS PLACEMENT RIGHT EYE (Right) - right as a surgical intervention.  The patient's history has been reviewed, patient examined, no change in status, stable for surgery.  I have reviewed the patient's chart and labs.  Questions were answered to the patient's satisfaction.     Baruch Goldmann

## 2020-02-13 NOTE — Anesthesia Postprocedure Evaluation (Signed)
Anesthesia Post Note  Patient: [name]  Procedure(s) Performed: CATARACT EXTRACTION PHACO AND INTRAOCULAR LENS PLACEMENT (IOC) with placement of Corticosteroid (Right Eye)  Patient location during evaluation: Short Stay Anesthesia Type: MAC Level of consciousness: awake and alert and patient cooperative Pain management: satisfactory to patient Vital Signs Assessment: post-procedure vital signs reviewed and stable Respiratory status: spontaneous breathing Cardiovascular status: stable Postop Assessment: no apparent nausea or vomiting Anesthetic complications: no   No complications documented.   Last Vitals:  Vitals:   02/13/20 0739 02/13/20 0824  BP: (!) 157/70 138/70  Pulse:  77  Resp:    Temp:  36.8 C  SpO2:  99%    Last Pain:  Vitals:   02/13/20 0824  TempSrc: Oral  PainSc: 0-No pain                 Atwell Mcdanel

## 2020-02-13 NOTE — Anesthesia Preprocedure Evaluation (Signed)
Anesthesia Evaluation  Patient identified by MRN, date of birth, ID band Patient awake    Reviewed: Allergy & Precautions, H&P , NPO status , Patient's Chart, lab work & pertinent test results, reviewed documented beta blocker date and time   Airway Mallampati: II  TM Distance: >3 FB Neck ROM: full    Dental no notable dental hx.    Pulmonary sleep apnea , former smoker,    Pulmonary exam normal breath sounds clear to auscultation       Cardiovascular Exercise Tolerance: Good hypertension, negative cardio ROS   Rhythm:regular Rate:Normal     Neuro/Psych  Neuromuscular disease negative psych ROS   GI/Hepatic negative GI ROS, Neg liver ROS,   Endo/Other  negative endocrine ROSdiabetes  Renal/GU CRFRenal disease  negative genitourinary   Musculoskeletal   Abdominal   Peds  Hematology negative hematology ROS (+)   Anesthesia Other Findings   Reproductive/Obstetrics negative OB ROS                             Anesthesia Physical  Anesthesia Plan  ASA: III  Anesthesia Plan: MAC   Post-op Pain Management:    Induction:   PONV Risk Score and Plan:   Airway Management Planned:   Additional Equipment:   Intra-op Plan:   Post-operative Plan:   Informed Consent: I have reviewed the patients History and Physical, chart, labs and discussed the procedure including the risks, benefits and alternatives for the proposed anesthesia with the patient or authorized representative who has indicated his/her understanding and acceptance.     Dental Advisory Given  Plan Discussed with: CRNA  Anesthesia Plan Comments:         Anesthesia Quick Evaluation

## 2020-02-13 NOTE — Op Note (Signed)
Date of procedure: 02/13/20  Pre-operative diagnosis:  Visually significant combined form age-related cataract, Right Eye (H25.811)  Post-operative diagnosis:   1. Visually significant combined form age-related cataract, Right Eye (H25.811) 2. Pain and inflammation following cataract surgery Right Eye (H57.11)  Procedure:  1. Removal of cataract via phacoemulsification and insertion of intra-ocular lens [place]  +22.5D into the capsular bag of the Right Eye 2. Placement of Dextenza insert, Right Eye  Attending surgeon: Gerda Diss. Larson Limones, MD, MA  Anesthesia: MAC, Topical [place]  Complications: None  Estimated Blood Loss: <71m (minimal)  Specimens: None  Implants: As above  Indications:  Visually significant age-related cataract, Right Eye  Procedure:  The patient was seen and identified in the pre-operative area. The operative eye was identified and dilated.  The operative eye was marked.  Topical anesthesia was administered to the operative eye.     The patient was then to the operative suite and placed in the supine position.  A timeout was performed confirming the patient, procedure to be performed, and all other relevant information.   The patient's face was prepped and draped in the usual fashion for intra-ocular surgery.  A lid speculum was placed into the operative eye and the surgical microscope moved into place and focused.  A superotemporal paracentesis was created using a 20 gauge paracentesis blade.  Shugarcaine was injected into the anterior chamber.  Viscoelastic was injected into the anterior chamber.  A temporal clear-corneal main wound incision was created using a 2.420mmicrokeratome.  A continuous curvilinear capsulorrhexis was initiated using an irrigating cystitome and completed using capsulorrhexis forceps.  Hydrodissection and hydrodeliniation were performed.  Viscoelastic was injected into the anterior chamber.  A phacoemulsification handpiece  and a chopper as a second instrument were used to remove the nucleus and epinucleus. The irrigation/aspiration handpiece was used to remove any remaining cortical material.   The capsular bag was reinflated with viscoelastic, checked, and found to be intact.  The intraocular lens was inserted into the capsular bag.  The irrigation/aspiration handpiece was used to remove any remaining viscoelastic.  The clear corneal wound and paracentesis wounds were then hydrated and checked with Weck-Cels to be watertight.  The lid-speculum was removed.   A Dextenza implant was placed in the lower canaliculus.   The drape was removed.  The patient's face was cleaned with a wet and dry 4x4. A clear shield was taped over the eye. The patient was taken to the post-operative care unit in good condition, having tolerated the procedure well.  Post-Op Instructions: The [facility] post-operative evaluation and will receive all other orders and instructions.

## 2020-02-13 NOTE — Anesthesia Procedure Notes (Signed)
Procedure Name: MAC Date/Time: 02/13/2020 7:57 AM Performed by: Vista Deck, CRNA Pre-anesthesia Checklist: Patient identified, Emergency Drugs available, Suction available, Timeout performed and Patient being monitored Patient Re-evaluated:Patient Re-evaluated prior to induction Oxygen Delivery Method: Nasal Cannula

## 2020-03-03 ENCOUNTER — Other Ambulatory Visit: Payer: Self-pay | Admitting: Family Medicine

## 2020-03-24 DIAGNOSIS — I10 Essential (primary) hypertension: Secondary | ICD-10-CM | POA: Diagnosis not present

## 2020-03-25 LAB — BASIC METABOLIC PANEL
BUN/Creatinine Ratio: 18 (ref 12–28)
BUN: 21 mg/dL (ref 8–27)
CO2: 20 mmol/L (ref 20–29)
Calcium: 9.4 mg/dL (ref 8.7–10.3)
Chloride: 110 mmol/L — ABNORMAL HIGH (ref 96–106)
Creatinine, Ser: 1.18 mg/dL — ABNORMAL HIGH (ref 0.57–1.00)
GFR calc Af Amer: 54 mL/min/{1.73_m2} — ABNORMAL LOW (ref >59)
GFR calc non Af Amer: 47 mL/min/{1.73_m2} — ABNORMAL LOW (ref >59)
Glucose: 110 mg/dL — ABNORMAL HIGH (ref 65–99)
Potassium: 4.1 mmol/L (ref 3.5–5.2)
Sodium: 145 mmol/L — ABNORMAL HIGH (ref 134–144)

## 2020-04-05 ENCOUNTER — Other Ambulatory Visit: Payer: Self-pay | Admitting: Family Medicine

## 2020-04-12 DIAGNOSIS — I129 Hypertensive chronic kidney disease with stage 1 through stage 4 chronic kidney disease, or unspecified chronic kidney disease: Secondary | ICD-10-CM | POA: Diagnosis not present

## 2020-04-12 DIAGNOSIS — N182 Chronic kidney disease, stage 2 (mild): Secondary | ICD-10-CM | POA: Diagnosis not present

## 2020-04-12 DIAGNOSIS — E1122 Type 2 diabetes mellitus with diabetic chronic kidney disease: Secondary | ICD-10-CM | POA: Diagnosis not present

## 2020-04-12 DIAGNOSIS — R809 Proteinuria, unspecified: Secondary | ICD-10-CM | POA: Diagnosis not present

## 2020-04-12 DIAGNOSIS — E785 Hyperlipidemia, unspecified: Secondary | ICD-10-CM | POA: Diagnosis not present

## 2020-05-24 DIAGNOSIS — H20021 Recurrent acute iridocyclitis, right eye: Secondary | ICD-10-CM | POA: Diagnosis not present

## 2020-06-01 ENCOUNTER — Ambulatory Visit (INDEPENDENT_AMBULATORY_CARE_PROVIDER_SITE_OTHER): Payer: Medicare Other | Admitting: Family Medicine

## 2020-06-01 ENCOUNTER — Encounter: Payer: Self-pay | Admitting: Family Medicine

## 2020-06-01 ENCOUNTER — Other Ambulatory Visit: Payer: Self-pay

## 2020-06-01 VITALS — BP 124/70 | Temp 97.7°F | Wt 198.8 lb

## 2020-06-01 DIAGNOSIS — E1169 Type 2 diabetes mellitus with other specified complication: Secondary | ICD-10-CM | POA: Diagnosis not present

## 2020-06-01 DIAGNOSIS — E0822 Diabetes mellitus due to underlying condition with diabetic chronic kidney disease: Secondary | ICD-10-CM | POA: Diagnosis not present

## 2020-06-01 DIAGNOSIS — N183 Chronic kidney disease, stage 3 unspecified: Secondary | ICD-10-CM

## 2020-06-01 DIAGNOSIS — E785 Hyperlipidemia, unspecified: Secondary | ICD-10-CM | POA: Diagnosis not present

## 2020-06-01 DIAGNOSIS — I10 Essential (primary) hypertension: Secondary | ICD-10-CM | POA: Diagnosis not present

## 2020-06-01 NOTE — Progress Notes (Signed)
   Subjective:    Patient ID: [name], female    DOB: Sep 03, 1950, 70 y.o.   MRN: [id number]  Hypertension This is a chronic problem. Risk factors for coronary artery disease include diabetes mellitus. There are no compliance problems.   Pt having no issues with blood pressure. Does not take blood pressure outside of office.  Essential hypertension, benign - Plan: Lipid Profile, Comprehensive Metabolic Panel (CMET), Hemoglobin A1c  Diabetes mellitus due to underlying condition with stage 3 chronic kidney disease, without long-term current use of insulin, unspecified whether stage 3a or 3b CKD (HCC) - Plan: Lipid Profile, Comprehensive Metabolic Panel (CMET), Hemoglobin A1c  Hyperlipidemia associated with type 2 diabetes mellitus ([place]) - Plan: Lipid Profile, Comprehensive Metabolic Panel (CMET), Hemoglobin A1c  Patient relates in urinating rather frequently which is more than likely related to the [place] blood pressure medicine on a regular basis   Review of Systems Please see above    Objective:   Physical Exam  Lungs clear heart regular pulse normal      Assessment & Plan:  1. Essential hypertension, benign She will do her labs in the near future continue medication blood pressure is good watch diet - Lipid Profile - Comprehensive Metabolic Panel (CMET) - Hemoglobin A1c  2. Diabetes mellitus due to underlying condition with stage 3 chronic kidney disease, without long-term current use of insulin, unspecified whether stage 3a or 3b CKD ([place]) Hopefully her diabetes under good control she will check lab work carefully watch starches and stay physically active continue medications increase urination related to the medicine - Lipid Profile - Comprehensive Metabolic Panel (CMET) - Hemoglobin A1c  3. Hyperlipidemia associated with type 2 diabetes mellitus ([place]) Continue medication to reduce risk of heart disease watch diet stay active - Lipid Profile - Comprehensive  Metabolic Panel (CMET) - Hemoglobin A1c  4. Morbid obesity ([place]) Morbid obesity Best approach by portion control increase activity  Follow-up in 4 to 6 months  Set up wellness with nurse practitioner

## 2020-06-02 LAB — COMPREHENSIVE METABOLIC PANEL
ALT: 17 IU/L (ref 0–32)
AST: 21 IU/L (ref 0–40)
Albumin/Globulin Ratio: 1.3 (ref 1.2–2.2)
Albumin: 4.3 g/dL (ref 3.8–4.8)
Alkaline Phosphatase: 117 IU/L (ref 44–121)
BUN/Creatinine Ratio: 12 (ref 12–28)
BUN: 14 mg/dL (ref 8–27)
Bilirubin Total: 0.4 mg/dL (ref 0.0–1.2)
CO2: 22 mmol/L (ref 20–29)
Calcium: 10 mg/dL (ref 8.7–10.3)
Chloride: 105 mmol/L (ref 96–106)
Creatinine, Ser: 1.2 mg/dL — ABNORMAL HIGH (ref 0.57–1.00)
Globulin, Total: 3.2 g/dL (ref 1.5–4.5)
Glucose: 99 mg/dL (ref 65–99)
Potassium: 4.2 mmol/L (ref 3.5–5.2)
Sodium: 142 mmol/L (ref 134–144)
Total Protein: 7.5 g/dL (ref 6.0–8.5)
eGFR: 49 mL/min/{1.73_m2} — ABNORMAL LOW (ref >59)

## 2020-06-02 LAB — HEMOGLOBIN A1C
Est. average glucose Bld gHb Est-mCnc: 134 mg/dL
Hgb A1c MFr Bld: 6.3 % — ABNORMAL HIGH (ref 4.8–5.6)

## 2020-06-02 LAB — LIPID PANEL
Chol/HDL Ratio: 2.8 ratio (ref 0.0–4.4)
Cholesterol, Total: 152 mg/dL (ref 100–199)
HDL: 54 mg/dL (ref >39)
LDL Chol Calc (NIH): 84 mg/dL (ref 0–99)
Triglycerides: 74 mg/dL (ref 0–149)
VLDL Cholesterol Cal: 14 mg/dL (ref 5–40)

## 2020-06-07 DIAGNOSIS — H20021 Recurrent acute iridocyclitis, right eye: Secondary | ICD-10-CM | POA: Diagnosis not present

## 2020-07-10 ENCOUNTER — Other Ambulatory Visit: Payer: Self-pay | Admitting: Family Medicine

## 2020-07-21 DIAGNOSIS — H20021 Recurrent acute iridocyclitis, right eye: Secondary | ICD-10-CM | POA: Diagnosis not present

## 2020-07-21 DIAGNOSIS — Z961 Presence of intraocular lens: Secondary | ICD-10-CM | POA: Diagnosis not present

## 2020-08-26 ENCOUNTER — Other Ambulatory Visit: Payer: Self-pay | Admitting: Family Medicine

## 2020-08-27 DIAGNOSIS — Z20822 Contact with and (suspected) exposure to covid-19: Secondary | ICD-10-CM | POA: Diagnosis not present

## 2020-09-27 DIAGNOSIS — I129 Hypertensive chronic kidney disease with stage 1 through stage 4 chronic kidney disease, or unspecified chronic kidney disease: Secondary | ICD-10-CM | POA: Diagnosis not present

## 2020-09-27 DIAGNOSIS — R809 Proteinuria, unspecified: Secondary | ICD-10-CM | POA: Diagnosis not present

## 2020-09-27 DIAGNOSIS — E1122 Type 2 diabetes mellitus with diabetic chronic kidney disease: Secondary | ICD-10-CM | POA: Diagnosis not present

## 2020-09-27 DIAGNOSIS — N182 Chronic kidney disease, stage 2 (mild): Secondary | ICD-10-CM | POA: Diagnosis not present

## 2020-09-27 DIAGNOSIS — E785 Hyperlipidemia, unspecified: Secondary | ICD-10-CM | POA: Diagnosis not present

## 2020-10-11 ENCOUNTER — Other Ambulatory Visit: Payer: Self-pay | Admitting: Family Medicine

## 2020-10-12 ENCOUNTER — Telehealth: Payer: Self-pay | Admitting: Family Medicine

## 2020-10-12 DIAGNOSIS — E1169 Type 2 diabetes mellitus with other specified complication: Secondary | ICD-10-CM

## 2020-10-12 DIAGNOSIS — Z79899 Other long term (current) drug therapy: Secondary | ICD-10-CM

## 2020-10-12 DIAGNOSIS — I10 Essential (primary) hypertension: Secondary | ICD-10-CM

## 2020-10-12 DIAGNOSIS — N183 Chronic kidney disease, stage 3 unspecified: Secondary | ICD-10-CM

## 2020-10-12 DIAGNOSIS — E785 Hyperlipidemia, unspecified: Secondary | ICD-10-CM

## 2020-10-12 NOTE — Telephone Encounter (Signed)
Lab orders placed. Left message to return call  

## 2020-10-12 NOTE — Telephone Encounter (Signed)
A1c, lipid, liver, met 7, urine ACR  Diabetes hyperlipidemia

## 2020-10-12 NOTE — Telephone Encounter (Signed)
Patient has appointment 10/14 and requesting labs.

## 2020-10-13 NOTE — Telephone Encounter (Signed)
Patient notified

## 2020-10-20 DIAGNOSIS — Z961 Presence of intraocular lens: Secondary | ICD-10-CM | POA: Diagnosis not present

## 2020-10-20 DIAGNOSIS — H20021 Recurrent acute iridocyclitis, right eye: Secondary | ICD-10-CM | POA: Diagnosis not present

## 2020-10-21 ENCOUNTER — Telehealth: Payer: Self-pay | Admitting: *Deleted

## 2020-10-21 NOTE — Chronic Care Management (AMB) (Signed)
  Chronic Care Management   Outreach Note  10/21/2020 Name: [name] MRN: [id number] DOB: Nov 10, 1950  [name] is a 70 y.o. year old female who is a primary care patient of Luking, Elayne Snare, MD. I reached out to [name] by phone today in response to a referral sent by [name]'s PCP, Dr. Wolfgang Phoenix.      An unsuccessful telephone outreach was attempted today. The patient was referred to the case management team for assistance with care management and care coordination.   Follow Up Plan: A HIPAA compliant phone message was left for the patient providing contact information and requesting a return call. The care management team will reach out to the patient again over the next 7 days. If patient returns call to provider office, please advise to call [place] at [phone].  [facility]  Direct Dial: [phone]

## 2020-10-21 NOTE — Chronic Care Management (AMB) (Signed)
  Chronic Care Management   Note  10/21/2020 Name: [name] MRN: [id number] DOB: 1950/10/29  [name] is a 70 y.o. year old female who is a primary care patient of Luking, Elayne Snare, MD. I reached out to [name] by phone today in response to a referral sent by [name]'s PCP, Dr. Wolfgang Phoenix      [name] was given information about Chronic Care Management services today including:  CCM service includes personalized support from designated clinical staff supervised by her physician, including individualized plan of care and coordination with other care providers 24/7 contact phone numbers for assistance for urgent and routine care needs. Service will only be billed when office clinical staff spend 20 minutes or more in a month to coordinate care. Only one practitioner may furnish and bill the service in a calendar month. The patient may stop CCM services at any time (effective at the end of the month) by phone call to the office staff. The patient will be responsible for cost sharing (co-pay) of up to 20% of the service fee (after annual deductible is met).  Patient agreed to services and verbal consent obtained.   Follow up plan: Telephone appointment with care management team member scheduled for:11/10/20  [facility]  Direct Dial: [phone]

## 2020-10-30 ENCOUNTER — Other Ambulatory Visit: Payer: Self-pay

## 2020-10-30 ENCOUNTER — Emergency Department (HOSPITAL_COMMUNITY)
Admission: EM | Admit: 2020-10-30 | Discharge: 2020-10-30 | Disposition: A | Discharge status: Home / Self Care | Payer: Medicare Other | Attending: Emergency Medicine | Admitting: Emergency Medicine

## 2020-10-30 ENCOUNTER — Emergency Department (HOSPITAL_COMMUNITY): Payer: Medicare Other

## 2020-10-30 ENCOUNTER — Encounter (HOSPITAL_COMMUNITY): Payer: Self-pay | Admitting: *Deleted

## 2020-10-30 DIAGNOSIS — E1122 Type 2 diabetes mellitus with diabetic chronic kidney disease: Secondary | ICD-10-CM | POA: Insufficient documentation

## 2020-10-30 DIAGNOSIS — Z87891 Personal history of nicotine dependence: Secondary | ICD-10-CM | POA: Diagnosis not present

## 2020-10-30 DIAGNOSIS — R42 Dizziness and giddiness: Secondary | ICD-10-CM | POA: Insufficient documentation

## 2020-10-30 DIAGNOSIS — I129 Hypertensive chronic kidney disease with stage 1 through stage 4 chronic kidney disease, or unspecified chronic kidney disease: Secondary | ICD-10-CM | POA: Insufficient documentation

## 2020-10-30 DIAGNOSIS — R600 Localized edema: Secondary | ICD-10-CM | POA: Diagnosis not present

## 2020-10-30 DIAGNOSIS — Z79899 Other long term (current) drug therapy: Secondary | ICD-10-CM | POA: Insufficient documentation

## 2020-10-30 DIAGNOSIS — N183 Chronic kidney disease, stage 3 unspecified: Secondary | ICD-10-CM | POA: Diagnosis not present

## 2020-10-30 DIAGNOSIS — Z7984 Long term (current) use of oral hypoglycemic drugs: Secondary | ICD-10-CM | POA: Insufficient documentation

## 2020-10-30 HISTORY — DX: Disorder of kidney and ureter, unspecified: N28.9

## 2020-10-30 LAB — COMPREHENSIVE METABOLIC PANEL
ALT: 20 U/L (ref 0–44)
AST: 21 U/L (ref 15–41)
Albumin: 4.4 g/dL (ref 3.5–5.0)
Alkaline Phosphatase: 117 U/L (ref 38–126)
Anion gap: 7 (ref 5–15)
BUN: 17 mg/dL (ref 8–23)
CO2: 26 mmol/L (ref 22–32)
Calcium: 9.4 mg/dL (ref 8.9–10.3)
Chloride: 108 mmol/L (ref 98–111)
Creatinine, Ser: 1.02 mg/dL — ABNORMAL HIGH (ref 0.44–1.00)
GFR, Estimated: 59 mL/min — ABNORMAL LOW (ref >60)
Glucose, Bld: 92 mg/dL (ref 70–99)
Potassium: 3.6 mmol/L (ref 3.5–5.1)
Sodium: 141 mmol/L (ref 135–145)
Total Bilirubin: 0.5 mg/dL (ref 0.3–1.2)
Total Protein: 8.1 g/dL (ref 6.5–8.1)

## 2020-10-30 LAB — CBC WITH DIFFERENTIAL/PLATELET
Abs Immature Granulocytes: 0.02 10*3/uL (ref 0.00–0.07)
Basophils Absolute: 0 10*3/uL (ref 0.0–0.1)
Basophils Relative: 1 %
Eosinophils Absolute: 0.1 10*3/uL (ref 0.0–0.5)
Eosinophils Relative: 1 %
HCT: 44.5 % (ref 36.0–46.0)
Hemoglobin: 14.2 g/dL (ref 12.0–15.0)
Immature Granulocytes: 0 %
Lymphocytes Relative: 29 %
Lymphs Abs: 1.8 10*3/uL (ref 0.7–4.0)
MCH: 29 pg (ref 26.0–34.0)
MCHC: 31.9 g/dL (ref 30.0–36.0)
MCV: 90.8 fL (ref 80.0–100.0)
Monocytes Absolute: 0.4 10*3/uL (ref 0.1–1.0)
Monocytes Relative: 6 %
Neutro Abs: 4.1 10*3/uL (ref 1.7–7.7)
Neutrophils Relative %: 63 %
Platelets: 283 10*3/uL (ref 150–400)
RBC: 4.9 MIL/uL (ref 3.87–5.11)
RDW: 15.2 % (ref 11.5–15.5)
WBC: 6.4 10*3/uL (ref 4.0–10.5)
nRBC: 0 % (ref 0.0–0.2)

## 2020-10-30 MED ORDER — SODIUM CHLORIDE 0.9 % IV SOLN: INTRAVENOUS | Status: DC

## 2020-10-30 MED ORDER — MECLIZINE HCL 12.5 MG PO TABS
25.0000 mg | ORAL_TABLET | Freq: Once | ORAL | Status: AC
  Administered 2020-10-30: 25 mg via ORAL
  Filled 2020-10-30: qty 2

## 2020-10-30 MED ORDER — MECLIZINE HCL 25 MG PO TABS: 25.0000 mg | ORAL_TABLET | Freq: Three times a day (TID) | ORAL | 0 refills | Status: DC | PRN

## 2020-10-30 NOTE — ED Notes (Signed)
Patient given bedside commode due to frequent trips to the bathroom for urination. Patient states the medicine she takes makes her pee a lot.

## 2020-10-30 NOTE — ED Triage Notes (Signed)
Right foot swelling noted as well.   Hx of dizziness about 2 years ago.

## 2020-10-30 NOTE — ED Triage Notes (Signed)
Pt with HA's and dizziness x 2 days.  Denies any numbness or tingling, denies any weakness.

## 2020-10-30 NOTE — ED Provider Notes (Signed)
Patient care transferred to me.  Labs and CT head are benign.  Patient is able to walk in the room without difficulty and feels well.  Her symptoms sound like a peripheral vertigo that sometimes induced by head movement and sometimes with certain positions such as laying down in bed.  Will give instructions on the Epley maneuver and Antivert Rx but I think this is unlikely to be acute CNS emergency such as stroke.  Discharged home with return precautions.   Sherwood Gambler, MD 10/30/20 [phone]

## 2020-10-30 NOTE — ED Provider Notes (Signed)
[facility] EMERGENCY DEPARTMENT Provider Note   CSN: [id number] Arrival date & time: 10/30/20  1235     History Chief Complaint  Patient presents with   Dizziness    [name] is a 70 y.o. female.  Duplicate note.  See complete note.      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, without long-term current use of insulin ([place]) 12/05/2019   Hyperlipidemia associated with type 2 diabetes mellitus ([place]) 12/05/2019   Venous stasis 09/02/2017   Morbid obesity ([place]) 12/06/2015   Pedal edema 08/11/2013   Lumbar pain 08/11/2013   Essential hypertension, benign 05/30/2012   CERVICAL RADICULITIS 09/15/2009    Past Surgical History:  Procedure Laterality Date   ABDOMINAL HYSTERECTOMY     CHOLECYSTECTOMY     COLONOSCOPY     COLONOSCOPY N/A 07/11/2017   Procedure: COLONOSCOPY;  Surgeon: Daneil Dolin, MD;  Location: [facility];  Service: Endoscopy;  Laterality: N/A;  8:30     OB History   No obstetric history on file.     Family History  Problem Relation Age of Onset   Heart disease Mother    Diabetes Mother    Arthritis Father    Cancer Father        prostate   Diabetes Maternal Grandmother     Social History   Tobacco Use   Smoking status: Former    Types: Cigarettes    Quit date: 06/17/1992    Years since quitting: 28.3   Smokeless tobacco: Never   Tobacco comments:    less than a pack a week  Vaping Use   Vaping Use: Never used  Substance Use Topics   Alcohol use: No   Drug use: No    Home Medications Prior to Admission medications   Medication Sig Start Date End Date Taking? Authorizing Provider  meclizine (ANTIVERT) 25 MG tablet Take 1  tablet (25 mg total) by mouth 3 (three) times daily as needed for dizziness. 10/30/20  Yes Fredia Sorrow, MD  acetaminophen (TYLENOL) 500 MG tablet Take 1,000 mg by mouth every 6 (six) hours as needed for moderate pain or headache.    [provider]  amLODipine (NORVASC) 5 MG tablet TAKE 1 TABLET BY MOUTH EVERY DAY 08/26/20   Kathyrn Drown, MD  dapagliflozin propanediol (FARXIGA) 5 MG TABS tablet Take 1 tablet (5 mg total) by mouth daily before breakfast. 01/12/20   Luking, Elayne Snare, MD  Polyethyl Glycol-Propyl Glycol (SYSTANE OP) Place 1 drop into both eyes daily as needed (dry eyes).    [provider]  rosuvastatin (CRESTOR) 40 MG tablet TAKE 1 TABLET BY MOUTH EVERY DAY 10/11/20   Kathyrn Drown, MD    Allergies    Patient has no known allergies.  Review of Systems   Review of Systems  Physical Exam Updated Vital Signs BP (!) 142/72   Pulse 65   Temp 98.1 F (36.7 C) (Oral)   Resp (!) 22   SpO2 100%   Physical Exam  ED Results / Procedures / Treatments   Labs (all labs ordered are listed, but only abnormal results  are displayed) Labs Reviewed  COMPREHENSIVE METABOLIC PANEL - Abnormal; Notable for the following components:      Result Value   Creatinine, Ser 1.02 (*)    GFR, Estimated 59 (*)    All other components within normal limits  CBC WITH DIFFERENTIAL/PLATELET    EKG EKG Interpretation  Date/Time:  Saturday October 30 2020 14:17:27 EDT Ventricular Rate:  80 PR Interval:  153 QRS Duration: 88 QT Interval:  437 QTC Calculation: 505 R Axis:   -17 Text Interpretation: Sinus rhythm Borderline left axis deviation Borderline T abnormalities, anterior leads Prolonged QT interval Confirmed by Sherwood Gambler [phone]) on 10/30/2020 3:25:21 PM  Radiology CT Head Wo Contrast  Result Date: 10/30/2020 CLINICAL DATA:  Neuro deficit, acute, stroke suspected. Dizziness, nonspecific. Dizzy for 2 days. EXAM: CT HEAD WITHOUT CONTRAST TECHNIQUE: Contiguous  axial images were obtained from the base of the skull through the vertex without intravenous contrast. COMPARISON:  CT head 04/25/2016. FINDINGS: Brain: There is no evidence of acute intracranial hemorrhage, mass lesion, brain edema or extra-axial fluid collection. The ventricles and subarachnoid spaces are appropriately sized for age. There is no CT evidence of acute cortical infarction. Stable mild chronic small vessel ischemic changes in the periventricular white matter. Vascular: Mild vascular calcifications at the skull base. No hyperdense vessel identified. Skull: Negative for fracture or focal lesion. Sinuses/Orbits: The visualized paranasal sinuses and mastoid air cells are clear. No orbital abnormalities are seen. Other: None. IMPRESSION: Stable examination without acute findings or CT evidence of acute stroke. Mild periventricular white matter disease appears unchanged. Electronically Signed   By: Richardean Sale M.D.   On: 10/30/2020 15:55    Procedures Procedures   Medications Ordered in ED Medications  meclizine (ANTIVERT) tablet 25 mg (25 mg Oral Given 10/30/20 1508)    ED Course  I have reviewed the triage vital signs and the nursing notes.  Pertinent labs & imaging results that were available during my care of the patient were reviewed by me and considered in my medical decision making (see chart for details).    MDM Rules/Calculators/A&P                            Final Clinical Impression(s) / ED Diagnoses Final diagnoses:  Dizziness    Rx / DC Orders ED Discharge Orders          Ordered    meclizine (ANTIVERT) 25 MG tablet  3 times daily PRN        10/30/20 1556             Fredia Sorrow, MD 10/31/20 [phone]

## 2020-10-30 NOTE — ED Provider Notes (Signed)
[facility] EMERGENCY DEPARTMENT Provider Note   CSN: [id number] Arrival date & time: 10/30/20  1235     History Chief Complaint  Patient presents with   Dizziness    [name] is a 70 y.o. female.  Patient with a complaint of intermittent dizziness with a feeling of some room spinning for 3 days.  It is not present at all times.  Currently no dizziness.  Tends to come on more with sitting up or laying down and with eyes closed feels like there is a bit of some room movement.  Also when walking occasionally it will hit her and she will need to hold onto something.  But again is intermittent.  It is not constant.  Patient states that several years ago she did have an episode of some dizziness and was treated with a medicine that she thinks was Antivert.  Patient denies any visual changes any speech problems any weakness to her upper or lower extremities or any numbness.      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, without long-term current use of insulin ([place]) 12/05/2019   Hyperlipidemia associated with type 2 diabetes mellitus ([place]) 12/05/2019   Venous stasis 09/02/2017   Morbid obesity ([place]) 12/06/2015   Pedal edema 08/11/2013   Lumbar pain 08/11/2013   Essential hypertension, benign 05/30/2012   CERVICAL RADICULITIS 09/15/2009    Past Surgical History:  Procedure Laterality Date   ABDOMINAL HYSTERECTOMY     CHOLECYSTECTOMY     COLONOSCOPY     COLONOSCOPY N/A 07/11/2017   Procedure: COLONOSCOPY;  Surgeon: Daneil Dolin, MD;  Location: [facility];  Service: Endoscopy;  Laterality: N/A;  8:30     OB History   No obstetric history on file.     Family History   Problem Relation Age of Onset   Heart disease Mother    Diabetes Mother    Arthritis Father    Cancer Father        prostate   Diabetes Maternal Grandmother     Social History   Tobacco Use   Smoking status: Former    Types: Cigarettes    Quit date: 06/17/1992    Years since quitting: 28.3   Smokeless tobacco: Never   Tobacco comments:    less than a pack a week  Vaping Use   Vaping Use: Never used  Substance Use Topics   Alcohol use: No   Drug use: No    Home Medications Prior to Admission medications   Medication Sig Start Date End Date Taking? Authorizing Provider  acetaminophen (TYLENOL) 500 MG tablet Take 1,000 mg by mouth every 6 (six) hours as needed for moderate pain or headache.    [provider]  amLODipine (NORVASC) 5 MG tablet TAKE 1 TABLET BY MOUTH EVERY DAY 08/26/20   Kathyrn Drown, MD  dapagliflozin propanediol (FARXIGA) 5 MG TABS tablet Take 1 tablet (5 mg total) by mouth daily before breakfast. 01/12/20   Luking, Elayne Snare, MD  Polyethyl Glycol-Propyl Glycol (SYSTANE OP) Place 1 drop into both eyes daily as needed (dry eyes).    [provider]  rosuvastatin (CRESTOR) 40 MG tablet TAKE 1 TABLET BY MOUTH EVERY DAY 10/11/20   Kathyrn Drown, MD    Allergies    Patient has no known allergies.  Review of Systems   Review of Systems  Constitutional:  Negative for chills and fever.  HENT:  Negative for ear pain and sore throat.   Eyes:  Negative for pain and visual disturbance.  Respiratory:  Negative for cough and shortness of breath.   Cardiovascular:  Negative for chest pain and palpitations.  Gastrointestinal:  Negative for abdominal pain and vomiting.  Genitourinary:  Negative for dysuria and hematuria.  Musculoskeletal:  Negative for arthralgias and back pain.  Skin:  Negative for color change and rash.  Neurological:  Positive for dizziness and headaches. Negative for seizures, syncope, facial asymmetry, speech difficulty,  weakness and numbness.  All other systems reviewed and are negative.  Physical Exam Updated Vital Signs BP (!) 176/89 (BP Location: Left Arm)   Pulse 83   Temp 98.1 F (36.7 C) (Oral)   Resp 14   SpO2 98%   Physical Exam Vitals and nursing note reviewed.  Constitutional:      General: She is not in acute distress.    Appearance: Normal appearance. She is well-developed.  HENT:     Head: Normocephalic and atraumatic.  Eyes:     Extraocular Movements: Extraocular movements intact.     Conjunctiva/sclera: Conjunctivae normal.     Pupils: Pupils are equal, round, and reactive to light.  Cardiovascular:     Rate and Rhythm: Normal rate and regular rhythm.     Heart sounds: No murmur heard. Pulmonary:     Effort: Pulmonary effort is normal. No respiratory distress.     Breath sounds: Normal breath sounds.  Abdominal:     Palpations: Abdomen is soft.     Tenderness: There is no abdominal tenderness.  Musculoskeletal:        General: Swelling present. Normal range of motion.     Cervical back: Normal range of motion and neck supple. No rigidity.     Comments: Swelling to both lower extremities right greater than left.  Associated with some pitting edema.  Skin:    General: Skin is warm and dry.  Neurological:     General: No focal deficit present.     Mental Status: She is alert and oriented to person, place, and time.     Cranial Nerves: No cranial nerve deficit.     Sensory: No sensory deficit.     Motor: No weakness.     Coordination: Coordination normal.    ED Results / Procedures / Treatments   Labs (all labs ordered are listed, but only abnormal results are displayed) Labs Reviewed - No data to display  EKG None  Radiology No results found.  Procedures Procedures   Medications Ordered in ED Medications - No data to display  ED Course  I have reviewed the triage vital signs and the nursing notes.  Pertinent labs & imaging results that were available  during my care of the patient were reviewed by me and considered in my medical decision making (see chart for details).    MDM Rules/Calculators/A&P                         Clinically I feel it is not stroke related since its intermittent.  And not constant.  We will treat with Antivert get head CT and basic labs.  Patient's  complete metabolic panel is back without any acute findings.  Other than the GFR being 59.  CBC significant for no leukocytosis and no anemia.  CT head is still pending.  Patient turned over to [facility] needing emergency physician.  We will follow-up on the head CT.  And also get patient up and ambulate her to make sure that she is able to walk.  Description of Antivert placed in her discharge instructions.   Final Clinical Impression(s) / ED Diagnoses Final diagnoses:  Dizziness    Rx / DC Orders ED Discharge Orders     None        Fredia Sorrow, MD 10/30/20 1555

## 2020-10-30 NOTE — Discharge Instructions (Addendum)
Follow-up with your doctor make an appointment for reevaluation take the Antivert as directed.  Return for any new or worse symptoms

## 2020-11-02 ENCOUNTER — Telehealth: Payer: Self-pay | Admitting: *Deleted

## 2020-11-02 NOTE — Chronic Care Management (AMB) (Signed)
  Care Management   Note  11/02/2020 Name: [name] MRN: [id number] DOB: 05/01/1950  [name] is a 70 y.o. year old female who is a primary care patient of Luking, Elayne Snare, MD and is actively engaged with the care management team. I reached out to [name] by phone today to assist with re-scheduling an initial visit with the RN Case Manager  Follow up plan: Unsuccessful telephone outreach attempt made. A HIPAA compliant phone message was left for the patient providing contact information and requesting a return call.  The care management team will reach out to the patient again over the next 7 days.  If patient returns call to provider office, please advise to call [place] at [phone].  [place] Management  Direct Dial: [phone]

## 2020-11-02 NOTE — Chronic Care Management (AMB) (Signed)
  Care Management   Note  11/02/2020 Name: [name] MRN: [id number] DOB: 1950/10/03  [name] is a 70 y.o. year old female who is a primary care patient of Luking, Elayne Snare, MD and is actively engaged with the care management team. I reached out to [name] by phone today to assist with re-scheduling an initial visit with the RN Case Manager  Follow up plan: Telephone appointment with care management team member scheduled for:12/08/20  [place] Management  Direct Dial: [phone]

## 2020-11-10 ENCOUNTER — Telehealth: Payer: Medicare Other

## 2020-11-22 ENCOUNTER — Other Ambulatory Visit: Payer: Self-pay | Admitting: Family Medicine

## 2020-11-25 DIAGNOSIS — I10 Essential (primary) hypertension: Secondary | ICD-10-CM | POA: Diagnosis not present

## 2020-11-25 DIAGNOSIS — E1169 Type 2 diabetes mellitus with other specified complication: Secondary | ICD-10-CM | POA: Diagnosis not present

## 2020-11-25 DIAGNOSIS — E0822 Diabetes mellitus due to underlying condition with diabetic chronic kidney disease: Secondary | ICD-10-CM | POA: Diagnosis not present

## 2020-11-25 DIAGNOSIS — E785 Hyperlipidemia, unspecified: Secondary | ICD-10-CM | POA: Diagnosis not present

## 2020-11-25 DIAGNOSIS — Z79899 Other long term (current) drug therapy: Secondary | ICD-10-CM | POA: Diagnosis not present

## 2020-11-25 DIAGNOSIS — N183 Chronic kidney disease, stage 3 unspecified: Secondary | ICD-10-CM | POA: Diagnosis not present

## 2020-11-26 LAB — LIPID PANEL
Chol/HDL Ratio: 3 ratio (ref 0.0–4.4)
Cholesterol, Total: 143 mg/dL (ref 100–199)
HDL: 48 mg/dL (ref >39)
LDL Chol Calc (NIH): 80 mg/dL (ref 0–99)
Triglycerides: 77 mg/dL (ref 0–149)
VLDL Cholesterol Cal: 15 mg/dL (ref 5–40)

## 2020-11-26 LAB — BASIC METABOLIC PANEL
BUN/Creatinine Ratio: 16 (ref 12–28)
BUN: 18 mg/dL (ref 8–27)
CO2: 21 mmol/L (ref 20–29)
Calcium: 9.5 mg/dL (ref 8.7–10.3)
Chloride: 107 mmol/L — ABNORMAL HIGH (ref 96–106)
Creatinine, Ser: 1.1 mg/dL — ABNORMAL HIGH (ref 0.57–1.00)
Glucose: 103 mg/dL — ABNORMAL HIGH (ref 70–99)
Potassium: 4.1 mmol/L (ref 3.5–5.2)
Sodium: 144 mmol/L (ref 134–144)
eGFR: 54 mL/min/{1.73_m2} — ABNORMAL LOW (ref >59)

## 2020-11-26 LAB — HEPATIC FUNCTION PANEL
ALT: 16 IU/L (ref 0–32)
AST: 19 IU/L (ref 0–40)
Albumin: 4.3 g/dL (ref 3.7–4.7)
Alkaline Phosphatase: 127 IU/L — ABNORMAL HIGH (ref 44–121)
Bilirubin Total: 0.5 mg/dL (ref 0.0–1.2)
Bilirubin, Direct: 0.15 mg/dL (ref 0.00–0.40)
Total Protein: 7.3 g/dL (ref 6.0–8.5)

## 2020-11-26 LAB — HEMOGLOBIN A1C
Est. average glucose Bld gHb Est-mCnc: 137 mg/dL
Hgb A1c MFr Bld: 6.4 % — ABNORMAL HIGH (ref 4.8–5.6)

## 2020-11-26 LAB — MICROALBUMIN / CREATININE URINE RATIO
Creatinine, Urine: 91.7 mg/dL
Microalb/Creat Ratio: 105 mg/g creat — ABNORMAL HIGH (ref 0–29)
Microalbumin, Urine: 96.5 ug/mL

## 2020-12-05 ENCOUNTER — Other Ambulatory Visit: Payer: Self-pay | Admitting: Family Medicine

## 2020-12-08 ENCOUNTER — Ambulatory Visit (INDEPENDENT_AMBULATORY_CARE_PROVIDER_SITE_OTHER): Payer: Medicare Other | Admitting: *Deleted

## 2020-12-08 DIAGNOSIS — E119 Type 2 diabetes mellitus without complications: Secondary | ICD-10-CM

## 2020-12-08 DIAGNOSIS — I10 Essential (primary) hypertension: Secondary | ICD-10-CM

## 2020-12-08 NOTE — Chronic Care Management (AMB) (Signed)
Chronic Care Management   CCM RN Visit Note  12/08/2020 Name: [name] MRN: [id number] DOB: 07-Aug-1950  Subjective: MAGAN WINNETT is a 70 y.o. year old female who is a primary care patient of Luking, Elayne Snare, MD. The care management team was consulted for assistance with disease management and care coordination needs.    Engaged with patient by telephone for follow up visit in response to provider referral for case management and/or care coordination services.   Consent to Services:  The patient was given the following information about Chronic Care Management services today, agreed to services, and gave verbal consent: 1. CCM service includes personalized support from designated clinical staff supervised by the primary care provider, including individualized plan of care and coordination with other care providers 2. 24/7 contact phone numbers for assistance for urgent and routine care needs. 3. Service will only be billed when office clinical staff spend 20 minutes or more in a month to coordinate care. 4. Only one practitioner may furnish and bill the service in a calendar month. 5.The patient may stop CCM services at any time (effective at the end of the month) by phone call to the office staff. 6. The patient will be responsible for cost sharing (co-pay) of up to 20% of the service fee (after annual deductible is met). Patient agreed to services and consent obtained.  Patient agreed to services and verbal consent obtained.   Assessment: Review of patient past medical history, allergies, medications, health status, including review of consultants reports, laboratory and other test data, was performed as part of comprehensive evaluation and provision of chronic care management services.   SDOH (Social Determinants of Health) assessments and interventions performed:  SDOH Interventions    Flowsheet Row Most Recent Value  SDOH Interventions   Food Insecurity Interventions  Intervention Not Indicated  Transportation Interventions Intervention Not Indicated        CCM Care Plan  No Known Allergies  Outpatient Encounter Medications as of 12/08/2020  Medication Sig   acetaminophen (TYLENOL) 500 MG tablet Take 1,000 mg by mouth every 6 (six) hours as needed for moderate pain or headache.   amLODipine (NORVASC) 5 MG tablet TAKE 1 TABLET BY MOUTH EVERY DAY   FARXIGA 5 MG TABS tablet TAKE 1 TABLET BY MOUTH EVERY DAY   meclizine (ANTIVERT) 25 MG tablet Take 1 tablet (25 mg total) by mouth 3 (three) times daily as needed for dizziness.   Polyethyl Glycol-Propyl Glycol (SYSTANE OP) Place 1 drop into both eyes daily as needed (dry eyes).   rosuvastatin (CRESTOR) 40 MG tablet TAKE 1 TABLET BY MOUTH EVERY DAY   No facility-administered encounter medications on file as of 12/08/2020.    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, without long-term current use of insulin ([place]) 12/05/2019   Hyperlipidemia associated with type 2 diabetes mellitus ([place]) 12/05/2019   Venous stasis 09/02/2017   Morbid obesity ([place]) 12/06/2015   Pedal edema 08/11/2013   Lumbar pain 08/11/2013   Essential hypertension, benign 05/30/2012   CERVICAL RADICULITIS 09/15/2009    Conditions to be addressed/monitored:HTN and DMII  Care Plan : Diabetes Type 2 (Adult)  Updates made by Kassie Mends, RN since 12/08/2020 12:00 AM     Problem: Glycemic Management (Diabetes, Type 2)   Priority: Medium     Long-Range Goal: Glycemic Management Optimized   Start Date: 12/08/2020  Expected End Date:  06/08/2021  This Visit's Progress: On track  Priority: Medium  Note:   Objective:  Lab Results  Component Value Date   HGBA1C 6.4 (H) 11/25/2020   Lab Results  Component Value Date   CREATININE 1.10 (H) 11/25/2020   CREATININE 1.02 (H) 10/30/2020   CREATININE 1.20 (H) 06/01/2020   Lab Results   Component Value Date   EGFR 54 (L) 11/25/2020   Current Barriers:  Knowledge Deficits related to basic Diabetes pathophysiology and self care/management- pt reports she lives with her spouse and she is independent with all aspects of her care, pt reports she would like advanced directives packet mailed. Knowledge Deficits related to medications used for management of diabetes- pt reports she cannot afford farxiga at 142$/ month and is having unwanted side effects with excessive/ frequent urination that prevents her from sleeping. Does not use cbg meter - glucometer is 3-62 years old and does not work correctly, pt has not monitored CBG in several months Does not adhere to provider recommendations re: does not check CBG, does not take farxiga while on vacation due to excessive urination Does not contact provider office for questions/concerns Case Manager Clinical Goal(s):  patient will demonstrate improved adherence to prescribed treatment plan for diabetes self care/management as evidenced by: daily monitoring and recording of CBG  adherence to ADA/ carb modified diet contacting provider for new or worsened symptoms or questions Interventions:  Collaboration with Kathyrn Drown, MD regarding development and update of comprehensive plan of care as evidenced by provider attestation and co-signature Inter-disciplinary care team collaboration (see longitudinal plan of care) Provided education to patient about basic DM disease process Reviewed medications with patient and discussed importance of medication adherence Discussed plans with patient for ongoing care management follow up and provided patient with direct contact information for care management team Provided patient with written educational materials related to hypo and hyperglycemia and importance of correct treatment Referral made to pharmacy team for assistance with costs of farxiga and unwanted side effects Review of patient status,  including review of consultants reports, relevant laboratory and other test results, and medications completed In basket message sent to primary care provider requesting prescription to be faxed for new glucometer, reported side effects pt is having with farxiga Mailed advanced directives packet to patient's home Self-Care Activities Attends all scheduled provider appointments Checks blood sugars as prescribed and utilize hyper and hypoglycemia protocol as needed Adheres to prescribed ADA/carb modified Patient Goals: - check blood sugar at prescribed times - check blood sugar if I feel it is too high or too low - take the blood sugar log to all doctor visits - take the blood sugar meter to all doctor visits  - look over and complete advanced directives packet mailed to you - call RN care manager with any questions at [phone] - follow up with primary care provider about new glucometer/ call pharmacy to see if ready for pickup - expect a call from pharmacist for costs of farxiga and unwanted side effects - change to whole grain breads, cereal, pasta - drink 6 to 8 glasses of water each day - fill half of plate with vegetables - limit fast food meals to no more than 1 per week - manage portion size - prepare main meal at home 3 to 5 days each week - read food labels for fat, fiber, carbohydrates and portion size - check feet daily for cuts, sores or redness - do heel pump exercise 2 to 3 times each day -  trim toenails straight across - wash and dry feet carefully every day - wear comfortable, cotton socks - wear comfortable, well-fitting shoes Follow Up Plan: Telephone follow up appointment with care management team member scheduled for:   01/12/2021    Care Plan : Hypertension (Adult)  Updates made by Kassie Mends, RN since 12/08/2020 12:00 AM     Problem: Hypertension (Hypertension)   Priority: Medium     Long-Range Goal: Hypertension Monitored   Start Date: 12/08/2020   Expected End Date: 06/08/2021  This Visit's Progress: On track  Note:   Objective:  Last practice recorded BP readings:  BP Readings from Last 3 Encounters:  10/30/20 (!) 142/72  06/01/20 124/70  02/13/20 138/70   Most recent eGFR/CrCl:  Lab Results  Component Value Date   EGFR 54 (L) 11/25/2020    No components found for: CRCL Current Barriers:  Knowledge Deficits related to basic understanding of hypertension pathophysiology and self care management- patient reports she does not monitor blood pressure consistently, only on occasion, does not follow a special diet. Case Manager Clinical Goal(s):  patient will verbalize understanding of plan for hypertension management patient will attend all scheduled medical appointments: 12/13/20 primary care provider patient will demonstrate improved adherence to prescribed treatment plan for hypertension as evidenced by taking all medications as prescribed, monitoring and recording blood pressure as directed, adhering to low sodium/DASH diet Interventions:  Collaboration with Kathyrn Drown, MD regarding development and update of comprehensive plan of care as evidenced by provider attestation and co-signature Inter-disciplinary care team collaboration (see longitudinal plan of care) Evaluation of current treatment plan related to hypertension self management and patient's adherence to plan as established by provider. Provided education to patient re: stroke prevention, s/s of heart attack and stroke, DASH diet, complications of uncontrolled blood pressure Reviewed medications with patient and discussed importance of compliance Discussed plans with patient for ongoing care management follow up and provided patient with direct contact information for care management team Advised patient, providing education and rationale, to monitor blood pressure daily and record, calling PCP for findings outside established parameters.  Reviewed scheduled/upcoming  provider appointments including:  primary care provider 12/13/2020 Education sent via My Chart- low sodium diet  Self-Care Activities: - Attends all scheduled provider appointments Calls provider office for new concerns, questions, or BP outside discussed parameters Checks BP and records as discussed Follows a low sodium diet/DASH diet Patient Goals: - check blood pressure 3 times per week - choose a place to take my blood pressure (home, clinic or office, retail store) - write blood pressure results in a log or diary  - look over education sent via My Chart- low sodium diet - try to do some type of exercise daily- walking is good Follow Up Plan: Telephone follow up appointment with care management team member scheduled for:   01/12/2021     Plan:Telephone follow up appointment with care management team member scheduled for:  01/02/2021  Jacqlyn Larsen [facility], BSN RN Case Manager [place] [phone]

## 2020-12-08 NOTE — Patient Instructions (Addendum)
Visit Information   PATIENT GOALS:   Goals Addressed             This Visit's Progress    Monitor and Manage My Blood Sugar-Diabetes Type 2       Timeframe:  Long-Range Goal Priority:  Medium Start Date:               12/08/2020              Expected End Date:     06/08/2021                  Follow Up Date 01/12/2021   - check blood sugar at prescribed times - check blood sugar if I feel it is too high or too low - take the blood sugar log to all doctor visits - take the blood sugar meter to all doctor visits  - look over and complete advanced directives packet mailed to you - call RN care manager with any questions at [phone] - follow up with primary care provider about new glucometer/ call pharmacy to see if ready for pickup - expect a call from pharmacist for costs of [place] and unwanted side effects   Why is this important?   Checking your blood sugar at home helps to keep it from getting very high or very low.  Writing the results in a diary or log helps the doctor know how to care for you.  Your blood sugar log should have the time, date and the results.  Also, write down the amount of insulin or other medicine that you take.  Other information, like what you ate, exercise done and how you were feeling, will also be helpful.     Notes:      Track and Manage My Blood Pressure-Hypertension       Timeframe:  Long-Range Goal Priority:  Medium Start Date:    12/08/2020                         Expected End Date:    06/08/2021                   Follow Up Date 01/12/2021   - check blood pressure 3 times per week - choose a place to take my blood pressure (home, clinic or office, retail store) - write blood pressure results in a log or diary  - look over education sent via My Chart- low sodium diet - try to do some type of exercise daily- walking is good   Why is this important?   You won't feel high blood pressure, but it can still hurt your blood vessels.  High  blood pressure can cause heart or kidney problems. It can also cause a stroke.  Making lifestyle changes like losing a little weight or eating less salt will help.  Checking your blood pressure at home and at different times of the day can help to control blood pressure.  If the doctor prescribes medicine remember to take it the way the doctor ordered.  Call the office if you cannot afford the medicine or if there are questions about it.     Notes:         Consent to CCM Services: Ms. Vaeth was given information about Chronic Care Management services including:  CCM service includes personalized support from designated clinical staff supervised by her physician, including individualized plan of care and coordination with other care providers 24/7  contact phone numbers for assistance for urgent and routine care needs. Service will only be billed when office clinical staff spend 20 minutes or more in a month to coordinate care. Only one practitioner may furnish and bill the service in a calendar month. The patient may stop CCM services at any time (effective at the end of the month) by phone call to the office staff. The patient will be responsible for cost sharing (co-pay) of up to 20% of the service fee (after annual deductible is met).  Patient agreed to services and verbal consent obtained.   Patient verbalizes understanding of instructions provided today and agrees to view in [place].   Telephone follow up appointment with care management team member scheduled for:   01/12/2021  Hypoglycemia Hypoglycemia is when the sugar (glucose) level in your blood is too low. Low blood sugar can happen to people who have diabetes and people who do not have diabetes. Low blood sugar can happen quickly, and it can be an emergency. What are the causes?   This condition happens most often in people who have diabetes. It may be caused by: Diabetes medicine. Not eating enough, or not eating often  enough. Doing more physical activity. Drinking alcohol on an empty stomach. If you do not have diabetes, this condition may be caused by: A tumor in the pancreas. Not eating enough, or not eating for long periods at a time (fasting). A very bad infection or illness. Problems after having weight loss (bariatric) surgery. Kidney failure or liver failure. Certain medicines. What increases the risk? This condition is more likely to develop in people who: Have diabetes and take medicines to lower their blood sugar. Abuse alcohol. Have a very bad illness. What are the signs or symptoms? Mild Hunger. Sweating and feeling clammy. Feeling dizzy or light-headed. Being sleepy or having trouble sleeping. Feeling like you may vomit (nauseous). A fast heartbeat. A headache. Blurry vision. Mood changes, such as: Being grouchy. Feeling worried or nervous (anxious). Tingling or loss of feeling (numbness) around your mouth, lips, or tongue. Moderate Confusion and poor judgment. Behavior changes. Weakness. Uneven heartbeat. Trouble with moving (coordination). Very low Very low blood sugar (severe hypoglycemia) is a medical emergency. It can cause: Fainting. Seizures. Loss of consciousness (coma). Death. How is this treated? Treating low blood sugar Low blood sugar is often treated by eating or drinking something that has sugar in it right away. The food or drink should contain 15 grams of a fast-acting carb (carbohydrate). Options include: 4 oz (120 mL) of fruit juice. 4 oz (120 mL) of regular soda (not diet soda). A few pieces of hard candy. Check food labels to see how many pieces to eat for 15 grams. 1 Tbsp (15 mL) of sugar or honey. 4 glucose tablets. 1 tube of glucose gel. Treating low blood sugar if you have diabetes If you can think clearly and swallow safely, follow the 15:15 rule: Take 15 grams of a fast-acting carb. Talk with your doctor about how much you should  take. Always keep a source of fast-acting carb with you, such as: Glucose tablets (take 4 tablets). A few pieces of hard candy. Check food labels to see how many pieces to eat for 15 grams. 4 oz (120 mL) of fruit juice. 4 oz (120 mL) of regular soda (not diet soda). 1 Tbsp (15 mL) of honey or sugar. 1 tube of glucose gel. Check your blood sugar 15 minutes after you take the carb. If your blood  sugar is still at or below 70 mg/dL (3.9 mmol/L), take 15 grams of a carb again. If your blood sugar does not go above 70 mg/dL (3.9 mmol/L) after 3 tries, get help right away. After your blood sugar goes back to normal, eat a meal or a snack within 1 hour.  Treating very low blood sugar If your blood sugar is below 54 mg/dL (3 mmol/L), you have very low blood sugar, or severe hypoglycemia. This is an emergency. Get medical help right away. If you have very low blood sugar and you cannot eat or drink, you will need to be given a hormone called glucagon. A family member or friend should learn how to check your blood sugar and how to give you glucagon. Ask your doctor if you need to have an emergency glucagon kit at home. Very low blood sugar may also need to be treated in a hospital. Follow these instructions at home: General instructions Take over-the-counter and prescription medicines only as told by your doctor. Stay aware of your blood sugar as told by your doctor. If you drink alcohol: Limit how much you have to: 0-1 drink a day for women who are not pregnant. 0-2 drinks a day for men. Know how much alcohol is in your drink. In the [place] one drink equals one 12 oz bottle of beer (355 mL), one 5 oz glass of wine (148 mL), or one 1 oz glass of hard liquor (44 mL). Be sure to eat food when you drink alcohol. Know that your body absorbs alcohol quickly. This may lead to low blood sugar later. Be sure to keep checking your blood sugar. Keep all follow-up visits. If you have diabetes:  Always have  a fast-acting carb (15 grams) with you to treat low blood sugar. Follow your diabetes care plan as told by your doctor. Make sure you: Know the symptoms of low blood sugar. Check your blood sugar as often as told. Always check it before and after exercise. Always check your blood sugar before you drive. Take your medicines as told. Follow your meal plan. Eat on time. Do not skip meals. Share your diabetes care plan with: Your work or school. People you live with. Carry a card or wear jewelry that says you have diabetes. Where to find more information American Diabetes Association: www.diabetes.org Contact a doctor if: You have trouble keeping your blood sugar in your target range. You have low blood sugar often. Get help right away if: You still have symptoms after you eat or drink something that contains 15 grams of fast-acting carb, and you cannot get your blood sugar above 70 mg/dL by following the 15:15 rule. Your blood sugar is below 54 mg/dL (3 mmol/L). You have a seizure. You faint. These symptoms may be an emergency. Get help right away. Call your local emergency services (911 in the [place] Do not wait to see if the symptoms will go away. Do not drive yourself to the hospital. Summary Hypoglycemia happens when the level of sugar (glucose) in your blood is too low. Low blood sugar can happen to people who have diabetes and people who do not have diabetes. Low blood sugar can happen quickly, and it can be an emergency. Make sure you know the symptoms of low blood sugar and know how to treat it. Always keep a source of sugar (fast-acting carb) with you to treat low blood sugar. This information is not intended to replace advice given to you by your health care  provider. Make sure you discuss any questions you have with your health care provider. Document Revised: 01/15/2020 Document Reviewed: 01/15/2020 Elsevier Patient Education  2022 [place]. Low-Sodium Eating  Plan Sodium, which is an element that makes up salt, helps you maintain a healthy balance of fluids in your body. Too much sodium can increase your blood pressure and cause fluid and waste to be held in your body. Your health care provider or dietitian may recommend following this plan if you have high blood pressure (hypertension), kidney disease, liver disease, or heart failure. Eating less sodium can help lower your blood pressure, reduce swelling, and protect your heart, liver, and kidneys. What are tips for following this plan? Reading food labels The Nutrition Facts label lists the amount of sodium in one serving of the food. If you eat more than one serving, you must multiply the listed amount of sodium by the number of servings. Choose foods with less than 140 mg of sodium per serving. Avoid foods with 300 mg of sodium or more per serving. Shopping  Look for lower-sodium products, often labeled as "low-sodium" or "no salt added." Always check the sodium content, even if foods are labeled as "unsalted" or "no salt added." Buy fresh foods. Avoid canned foods and pre-made or frozen meals. Avoid canned, cured, or processed meats. Buy breads that have less than 80 mg of sodium per slice. Cooking  Eat more home-cooked food and less restaurant, buffet, and fast food. Avoid adding salt when cooking. Use salt-free seasonings or herbs instead of table salt or sea salt. Check with your health care provider or pharmacist before using salt substitutes. Cook with plant-based oils, such as canola, sunflower, or olive oil. Meal planning When eating at a restaurant, ask that your food be prepared with less salt or no salt, if possible. Avoid dishes labeled as brined, pickled, cured, smoked, or made with soy sauce, miso, or teriyaki sauce. Avoid foods that contain MSG (monosodium glutamate). MSG is sometimes added to [place] food, bouillon, and some canned foods. Make meals that can be grilled, baked,  poached, roasted, or steamed. These are generally made with less sodium. General information Most people on this plan should limit their sodium intake to 1,500-2,000 mg (milligrams) of sodium each day. What foods should I eat? Fruits Fresh, frozen, or canned fruit. Fruit juice. Vegetables Fresh or frozen vegetables. "No salt added" canned vegetables. "No salt added" tomato sauce and paste. Low-sodium or reduced-sodium tomato and vegetable juice. Grains Low-sodium cereals, including oats, puffed wheat and rice, and shredded wheat. Low-sodium crackers. Unsalted rice. Unsalted pasta. Low-sodium bread. Whole-grain breads and whole-grain pasta. Meats and other proteins Fresh or frozen (no salt added) meat, poultry, seafood, and fish. Low-sodium canned tuna and salmon. Unsalted nuts. Dried peas, beans, and lentils without added salt. Unsalted canned beans. Eggs. Unsalted nut butters. Dairy Milk. Soy milk. Cheese that is naturally low in sodium, such as ricotta cheese, fresh mozzarella, or Swiss cheese. Low-sodium or reduced-sodium cheese. Cream cheese. Yogurt. Seasonings and condiments Fresh and dried herbs and spices. Salt-free seasonings. Low-sodium mustard and ketchup. Sodium-free salad dressing. Sodium-free light mayonnaise. Fresh or refrigerated horseradish. Lemon juice. Vinegar. Other foods Homemade, reduced-sodium, or low-sodium soups. Unsalted popcorn and pretzels. Low-salt or salt-free chips. The items listed above may not be a complete list of foods and beverages you can eat. Contact a dietitian for more information. What foods should I avoid? Vegetables Sauerkraut, pickled vegetables, and relishes. Olives. [place] fries. Onion rings. Regular canned vegetables (not  low-sodium or reduced-sodium). Regular canned tomato sauce and paste (not low-sodium or reduced-sodium). Regular tomato and vegetable juice (not low-sodium or reduced-sodium). Frozen vegetables in sauces. Grains Instant hot  cereals. Bread stuffing, pancake, and biscuit mixes. Croutons. Seasoned rice or pasta mixes. Noodle soup cups. Boxed or frozen macaroni and cheese. Regular salted crackers. Self-rising flour. Meats and other proteins Meat or fish that is salted, canned, smoked, spiced, or pickled. Precooked or cured meat, such as sausages or meat loaves. [place] Salines. Ham. Pepperoni. Hot dogs. Corned beef. Chipped beef. Salt pork. Jerky. Pickled herring. Anchovies and sardines. Regular canned tuna. Salted nuts. Dairy Processed cheese and cheese spreads. Hard cheeses. Cheese curds. Blue cheese. Feta cheese. String cheese. Regular cottage cheese. Buttermilk. Canned milk. Fats and oils Salted butter. Regular margarine. Ghee. Bacon fat. Seasonings and condiments Onion salt, garlic salt, seasoned salt, table salt, and sea salt. Canned and packaged gravies. Worcestershire sauce. Tartar sauce. Barbecue sauce. Teriyaki sauce. Soy sauce, including reduced-sodium. Steak sauce. Fish sauce. Oyster sauce. Cocktail sauce. Horseradish that you find on the shelf. Regular ketchup and mustard. Meat flavorings and tenderizers. Bouillon cubes. Hot sauce. Pre-made or packaged marinades. Pre-made or packaged taco seasonings. Relishes. Regular salad dressings. Salsa. Other foods Salted popcorn and pretzels. Corn chips and puffs. Potato and tortilla chips. Canned or dried soups. Pizza. Frozen entrees and pot pies. The items listed above may not be a complete list of foods and beverages you should avoid. Contact a dietitian for more information. Summary Eating less sodium can help lower your blood pressure, reduce swelling, and protect your heart, liver, and kidneys. Most people on this plan should limit their sodium intake to 1,500-2,000 mg (milligrams) of sodium each day. Canned, boxed, and frozen foods are high in sodium. Restaurant foods, fast foods, and pizza are also very high in sodium. You also get sodium by adding salt to food. Try to cook  at home, eat more fresh fruits and vegetables, and eat less fast food and canned, processed, or prepared foods. This information is not intended to replace advice given to you by your health care provider. Make sure you discuss any questions you have with your health care provider. Document Revised: 03/21/2019 Document Reviewed: 01/15/2019 Elsevier Patient Education  2022 [place] [facility], BSN RN Case Manager [facility] [phone]   CLINICAL CARE PLAN: Patient Care Plan: Diabetes Type 2 (Adult)     Problem Identified: Glycemic Management (Diabetes, Type 2)   Priority: Medium     Long-Range Goal: Glycemic Management Optimized   Start Date: 12/08/2020  Expected End Date: 06/08/2021  This Visit's Progress: On track  Priority: Medium  Note:   Objective:  Lab Results  Component Value Date   HGBA1C 6.4 (H) 11/25/2020   Lab Results  Component Value Date   CREATININE 1.10 (H) 11/25/2020   CREATININE 1.02 (H) 10/30/2020   CREATININE 1.20 (H) 06/01/2020   Lab Results  Component Value Date   EGFR 54 (L) 11/25/2020   Current Barriers:  Knowledge Deficits related to basic Diabetes pathophysiology and self care/management- pt reports she lives with her spouse and she is independent with all aspects of her care, pt reports she would like advanced directives packet mailed. Knowledge Deficits related to medications used for management of diabetes- pt reports she cannot afford farxiga at 142$/ month and is having unwanted side effects with excessive/ frequent urination that prevents her from sleeping. Does not use cbg meter - glucometer is 10-97 years old and does  not work correctly, pt has not monitored CBG in several months Does not adhere to provider recommendations re: does not check CBG, does not take farxiga while on vacation due to excessive urination Does not contact provider office for questions/concerns Case Manager Clinical Goal(s):  patient  will demonstrate improved adherence to prescribed treatment plan for diabetes self care/management as evidenced by: daily monitoring and recording of CBG  adherence to ADA/ carb modified diet contacting provider for new or worsened symptoms or questions Interventions:  Collaboration with Kathyrn Drown, MD regarding development and update of comprehensive plan of care as evidenced by provider attestation and co-signature Inter-disciplinary care team collaboration (see longitudinal plan of care) Provided education to patient about basic DM disease process Reviewed medications with patient and discussed importance of medication adherence Discussed plans with patient for ongoing care management follow up and provided patient with direct contact information for care management team Provided patient with written educational materials related to hypo and hyperglycemia and importance of correct treatment Referral made to pharmacy team for assistance with costs of farxiga and unwanted side effects Review of patient status, including review of consultants reports, relevant laboratory and other test results, and medications completed In basket message sent to primary care provider requesting prescription to be faxed for new glucometer, reported side effects pt is having with farxiga Mailed advanced directives packet to patient's home Self-Care Activities Attends all scheduled provider appointments Checks blood sugars as prescribed and utilize hyper and hypoglycemia protocol as needed Adheres to prescribed ADA/carb modified Patient Goals: - check blood sugar at prescribed times - check blood sugar if I feel it is too high or too low - take the blood sugar log to all doctor visits - take the blood sugar meter to all doctor visits  - look over and complete advanced directives packet mailed to you - call RN care manager with any questions at [phone] - follow up with primary care provider about new  glucometer/ call pharmacy to see if ready for pickup - expect a call from pharmacist for costs of farxiga and unwanted side effects - change to whole grain breads, cereal, pasta - drink 6 to 8 glasses of water each day - fill half of plate with vegetables - limit fast food meals to no more than 1 per week - manage portion size - prepare main meal at home 3 to 5 days each week - read food labels for fat, fiber, carbohydrates and portion size - check feet daily for cuts, sores or redness - do heel pump exercise 2 to 3 times each day - trim toenails straight across - wash and dry feet carefully every day - wear comfortable, cotton socks - wear comfortable, well-fitting shoes Follow Up Plan: Telephone follow up appointment with care management team member scheduled for:   01/12/2021    Patient Care Plan: Hypertension (Adult)     Problem Identified: Hypertension (Hypertension)   Priority: Medium     Long-Range Goal: Hypertension Monitored   Start Date: 12/08/2020  Expected End Date: 06/08/2021  This Visit's Progress: On track  Note:   Objective:  Last practice recorded BP readings:  BP Readings from Last 3 Encounters:  10/30/20 (!) 142/72  06/01/20 124/70  02/13/20 138/70   Most recent eGFR/CrCl:  Lab Results  Component Value Date   EGFR 54 (L) 11/25/2020    No components found for: CRCL Current Barriers:  Knowledge Deficits related to basic understanding of hypertension pathophysiology and self care management- patient reports  she does not monitor blood pressure consistently, only on occasion, does not follow a special diet. Case Manager Clinical Goal(s):  patient will verbalize understanding of plan for hypertension management patient will attend all scheduled medical appointments: 12/13/20 primary care provider patient will demonstrate improved adherence to prescribed treatment plan for hypertension as evidenced by taking all medications as prescribed, monitoring and  recording blood pressure as directed, adhering to low sodium/DASH diet Interventions:  Collaboration with Kathyrn Drown, MD regarding development and update of comprehensive plan of care as evidenced by provider attestation and co-signature Inter-disciplinary care team collaboration (see longitudinal plan of care) Evaluation of current treatment plan related to hypertension self management and patient's adherence to plan as established by provider. Provided education to patient re: stroke prevention, s/s of heart attack and stroke, DASH diet, complications of uncontrolled blood pressure Reviewed medications with patient and discussed importance of compliance Discussed plans with patient for ongoing care management follow up and provided patient with direct contact information for care management team Advised patient, providing education and rationale, to monitor blood pressure daily and record, calling PCP for findings outside established parameters.  Reviewed scheduled/upcoming provider appointments including:  primary care provider 12/13/2020 Education sent via My Chart- low sodium diet  Self-Care Activities: - Attends all scheduled provider appointments Calls provider office for new concerns, questions, or BP outside discussed parameters Checks BP and records as discussed Follows a low sodium diet/DASH diet Patient Goals: - check blood pressure 3 times per week - choose a place to take my blood pressure (home, clinic or office, retail store) - write blood pressure results in a log or diary  - look over education sent via My Chart- low sodium diet - try to do some type of exercise daily- walking is good Follow Up Plan: Telephone follow up appointment with care management team member scheduled for:   01/12/2021

## 2020-12-10 ENCOUNTER — Ambulatory Visit: Payer: Medicare Other | Admitting: Family Medicine

## 2020-12-10 ENCOUNTER — Telehealth: Payer: Self-pay | Admitting: *Deleted

## 2020-12-10 NOTE — Chronic Care Management (AMB) (Signed)
  Care Management   Note  12/10/2020 Name: SHAYLINN HLADIK MRN: [id number] DOB: 1950/11/28  MATYLDA FEHRING is a 70 y.o. year old female who is a primary care patient of Luking, Elayne Snare, MD and is actively engaged with the care management team. I reached out to [name] by phone today to assist with scheduling an initial visit with the Pharmacist  Follow up plan: Unsuccessful telephone outreach attempt made. A HIPAA compliant phone message was left for the patient providing contact information and requesting a return call.  The care management team will reach out to the patient again over the next 7 days.  If patient returns call to provider office, please advise to call [place] at [phone].  [facility]  Direct Dial: [phone]

## 2020-12-13 ENCOUNTER — Other Ambulatory Visit: Payer: Self-pay

## 2020-12-13 ENCOUNTER — Ambulatory Visit (INDEPENDENT_AMBULATORY_CARE_PROVIDER_SITE_OTHER): Payer: Medicare Other | Admitting: Family Medicine

## 2020-12-13 DIAGNOSIS — U071 COVID-19: Secondary | ICD-10-CM

## 2020-12-13 NOTE — Progress Notes (Signed)
Scheduled with PharmD 12/24/20

## 2020-12-13 NOTE — Progress Notes (Signed)
   Subjective:  I connected with  [name] on 12/13/20 by a phone enabled telemedicine application and verified that I am speaking with the correct person using two identifiers.   I discussed the limitations of evaluation and management by telemedicine. The patient expressed understanding and agreed to proceed.  Patient location: home  Provider location: in office  I provided 15 minutes of non face - to - face time during this encounter.   Patient ID: [name], female    DOB: 1950-08-23, 69 y.o.   MRN: [id number]  HPI Covid symptoms started last week Wednesday - cough, congestion, sneezing, achy   Patient presents today with respiratory illness Number of days present-5-1/2  Symptoms include-sore throat congestion coughing no wheezing or difficulty breathing  Presence of worrisome signs (severe shortness of breath, lethargy, etc.) -none  Recent/current visit to urgent care or ER-none  Recent direct exposure to Covid-went to the beach recently  Any current Covid testing-positive   Review of Systems     Objective:   Physical Exam  Today's visit was via telephone Physical exam was not possible for this visit       Assessment & Plan:   Covid infection This is a viral process.  Mild cases are treated with supportive measures at home such as Tylenol rest fluids.  In some situations monoclonal antibodies may be appropriate depending on the patient's risk criteria.  The patient was educated regarding progressive illness including respiratory, persistent vomiting, change in mental status.  If any of these occur ER evaluation is recommended. Patient was educated about the following as well Covid-19 respiratory warning: Covid-19 is a virus that causes hypoxia (low oxygen level in blood) in some people. If you develop any changes in your usual breathing pattern: difficulty catching your breath, more short winded with activity or with resting, or anything that concerns  you about your breathing, do not hesitate to go to the emergency department immediately for evaluation. Please do not delay to get treatment.   Agrees with plan of care discussed today. Understands warning signs to seek further care: Chest pain, shortness of breath, mental confusion, profuse vomiting, any significant change in health. Understands to follow-up if symptoms do not improve, or worsen.    Antivirals not indicated No worrisome symptoms unlikely to develop pneumonia Warning signs discussed

## 2020-12-13 NOTE — Chronic Care Management (AMB) (Signed)
  Care Management   Note  12/13/2020 Name: [name] MRN: [id number] DOB: May 24, 1950  [name] is a 70 y.o. year old female who is a primary care patient of Luking, Elayne Snare, MD and is actively engaged with the care management team. I reached out to [name] by phone today to assist with scheduling an initial visit with the Pharmacist  Follow up plan: Telephone appointment with care management team member scheduled for: 12/24/20  [facility]  Direct Dial: [phone]

## 2020-12-21 ENCOUNTER — Telehealth: Payer: Self-pay | Admitting: Family Medicine

## 2020-12-21 NOTE — Telephone Encounter (Signed)
Left message for patient to call back and schedule Medicare Annual Wellness Visit (AWV) in office.   If unable to come into the office for AWV,  please offer to do virtually or by telephone.  Last AWV:  12/16/2015  Please schedule at anytime with [facility] Advisor.  40 minute appointment  Any questions, please contact me at [phone]

## 2020-12-24 ENCOUNTER — Ambulatory Visit: Payer: Medicare Other | Admitting: Pharmacist

## 2020-12-24 DIAGNOSIS — E1169 Type 2 diabetes mellitus with other specified complication: Secondary | ICD-10-CM

## 2020-12-24 DIAGNOSIS — I10 Essential (primary) hypertension: Secondary | ICD-10-CM

## 2020-12-24 DIAGNOSIS — E119 Type 2 diabetes mellitus without complications: Secondary | ICD-10-CM

## 2020-12-24 DIAGNOSIS — E0822 Diabetes mellitus due to underlying condition with diabetic chronic kidney disease: Secondary | ICD-10-CM

## 2020-12-24 MED ORDER — VALSARTAN 40 MG PO TABS
40.0000 mg | ORAL_TABLET | Freq: Every day | ORAL | 2 refills | Status: DC
Start: 2020-12-24 — End: 2021-01-17

## 2020-12-24 NOTE — Patient Instructions (Addendum)
[name],  It was great to talk to you today!  Please call me with any questions or concerns.   Visit Information   PATIENT GOALS:   Goals Addressed             This Visit's Progress    Medication Management       Patient Goals/Self-Care Activities Patient will:  Take medications as prescribed Check blood pressure at least once daily, document, and provide at future appointments Collaborate with provider on medication access solutions Engage in dietary modifications by less frequent dining out and decreased fat intake        Consent to CCM Services: [name] was given information about Chronic Care Management services including:  CCM service includes personalized support from designated clinical staff supervised by her physician, including individualized plan of care and coordination with other care providers 24/7 contact phone numbers for assistance for urgent and routine care needs. Service will only be billed when office clinical staff spend 20 minutes or more in a month to coordinate care. Only one practitioner may furnish and bill the service in a calendar month. The patient may stop CCM services at any time (effective at the end of the month) by phone call to the office staff. The patient will be responsible for cost sharing (co-pay) of up to 20% of the service fee (after annual deductible is met).  Patient agreed to services and verbal consent obtained.   Patient verbalizes understanding of instructions provided today and agrees to view in [place].   Telephone follow up appointment with care management team member scheduled for:01/14/21  Kennon Holter, PharmD Clinical Pharmacist [employer] [phone]  CLINICAL CARE PLAN: Patient Care Plan: Diabetes Type 2 (Adult)     Problem Identified: Glycemic Management (Diabetes, Type 2)   Priority: Medium     Long-Range Goal: Glycemic Management Optimized   Start Date: 12/08/2020   Expected End Date: 06/08/2021  This Visit's Progress: On track  Priority: Medium  Note:   Objective:  Lab Results  Component Value Date   HGBA1C 6.4 (H) 11/25/2020   Lab Results  Component Value Date   CREATININE 1.10 (H) 11/25/2020   CREATININE 1.02 (H) 10/30/2020   CREATININE 1.20 (H) 06/01/2020   Lab Results  Component Value Date   EGFR 54 (L) 11/25/2020   Current Barriers:  Knowledge Deficits related to basic Diabetes pathophysiology and self care/management- pt reports she lives with her spouse and she is independent with all aspects of her care, pt reports she would like advanced directives packet mailed. Knowledge Deficits related to medications used for management of diabetes- pt reports she cannot afford farxiga at 142$/ month and is having unwanted side effects with excessive/ frequent urination that prevents her from sleeping. Does not use cbg meter - glucometer is 81-15 years old and does not work correctly, pt has not monitored CBG in several months Does not adhere to provider recommendations re: does not check CBG, does not take farxiga while on vacation due to excessive urination Does not contact provider office for questions/concerns Case Manager Clinical Goal(s):  patient will demonstrate improved adherence to prescribed treatment plan for diabetes self care/management as evidenced by: daily monitoring and recording of CBG  adherence to ADA/ carb modified diet contacting provider for new or worsened symptoms or questions Interventions:  Collaboration with Wolfgang Phoenix, Elayne Snare, MD regarding development and update of comprehensive plan of care as evidenced by provider attestation and co-signature Inter-disciplinary care team collaboration (see longitudinal  plan of care) Provided education to patient about basic DM disease process Reviewed medications with patient and discussed importance of medication adherence Discussed plans with patient for ongoing care management follow  up and provided patient with direct contact information for care management team Provided patient with written educational materials related to hypo and hyperglycemia and importance of correct treatment Referral made to pharmacy team for assistance with costs of farxiga and unwanted side effects Review of patient status, including review of consultants reports, relevant laboratory and other test results, and medications completed In basket message sent to primary care provider requesting prescription to be faxed for new glucometer, reported side effects pt is having with farxiga Mailed advanced directives packet to patient's home Self-Care Activities Attends all scheduled provider appointments Checks blood sugars as prescribed and utilize hyper and hypoglycemia protocol as needed Adheres to prescribed ADA/carb modified Patient Goals: - check blood sugar at prescribed times - check blood sugar if I feel it is too high or too low - take the blood sugar log to all doctor visits - take the blood sugar meter to all doctor visits  - look over and complete advanced directives packet mailed to you - call RN care manager with any questions at [phone] - follow up with primary care provider about new glucometer/ call pharmacy to see if ready for pickup - expect a call from pharmacist for costs of farxiga and unwanted side effects - change to whole grain breads, cereal, pasta - drink 6 to 8 glasses of water each day - fill half of plate with vegetables - limit fast food meals to no more than 1 per week - manage portion size - prepare main meal at home 3 to 5 days each week - read food labels for fat, fiber, carbohydrates and portion size - check feet daily for cuts, sores or redness - do heel pump exercise 2 to 3 times each day - trim toenails straight across - wash and dry feet carefully every day - wear comfortable, cotton socks - wear comfortable, well-fitting shoes Follow Up Plan:  Telephone follow up appointment with care management team member scheduled for:   01/12/2021    Patient Care Plan: Hypertension (Adult)     Problem Identified: Hypertension (Hypertension)   Priority: Medium     Long-Range Goal: Hypertension Monitored   Start Date: 12/08/2020  Expected End Date: 06/08/2021  This Visit's Progress: On track  Note:   Objective:  Last practice recorded BP readings:  BP Readings from Last 3 Encounters:  10/30/20 (!) 142/72  06/01/20 124/70  02/13/20 138/70   Most recent eGFR/CrCl:  Lab Results  Component Value Date   EGFR 54 (L) 11/25/2020    No components found for: CRCL Current Barriers:  Knowledge Deficits related to basic understanding of hypertension pathophysiology and self care management- patient reports she does not monitor blood pressure consistently, only on occasion, does not follow a special diet. Case Manager Clinical Goal(s):  patient will verbalize understanding of plan for hypertension management patient will attend all scheduled medical appointments: 12/13/20 primary care provider patient will demonstrate improved adherence to prescribed treatment plan for hypertension as evidenced by taking all medications as prescribed, monitoring and recording blood pressure as directed, adhering to low sodium/DASH diet Interventions:  Collaboration with Kathyrn Drown, MD regarding development and update of comprehensive plan of care as evidenced by provider attestation and co-signature Inter-disciplinary care team collaboration (see longitudinal plan of care) Evaluation of current treatment plan related to hypertension  self management and patient's adherence to plan as established by provider. Provided education to patient re: stroke prevention, s/s of heart attack and stroke, DASH diet, complications of uncontrolled blood pressure Reviewed medications with patient and discussed importance of compliance Discussed plans with patient for ongoing  care management follow up and provided patient with direct contact information for care management team Advised patient, providing education and rationale, to monitor blood pressure daily and record, calling PCP for findings outside established parameters.  Reviewed scheduled/upcoming provider appointments including:  primary care provider 12/13/2020 Education sent via My Chart- low sodium diet  Self-Care Activities: - Attends all scheduled provider appointments Calls provider office for new concerns, questions, or BP outside discussed parameters Checks BP and records as discussed Follows a low sodium diet/DASH diet Patient Goals: - check blood pressure 3 times per week - choose a place to take my blood pressure (home, clinic or office, retail store) - write blood pressure results in a log or diary  - look over education sent via My Chart- low sodium diet - try to do some type of exercise daily- walking is good Follow Up Plan: Telephone follow up appointment with care management team member scheduled for:   01/12/2021    Patient Care Plan: Medication Management     Problem Identified: T2DM, HTN, HLD, CKD   Priority: High  Onset Date: 12/24/2020     Long-Range Goal: Disease Progression Prevention   Start Date: 12/24/2020  Expected End Date: 01/02/2021  This Visit's Progress: On track  Priority: High  Note:   Current Barriers:  Unable to independently afford treatment regimen Unable to achieve control of hyperlipidemia Suboptimal therapeutic regimen for diabetes and chronic kidney disease  Pharmacist Clinical Goal(s):  Patient will verbalize ability to afford treatment regimen achieve control of hyperlipidemia as evidenced by improved LDL adhere to plan to optimize therapeutic regimen for diabetes and chronic kidney disease as evidenced by report of adherence to recommended medication management changes through collaboration with PharmD and provider.   Interventions: 1:1  collaboration with Kathyrn Drown, MD regarding development and update of comprehensive plan of care as evidenced by provider attestation and co-signature Inter-disciplinary care team collaboration (see longitudinal plan of care) Comprehensive medication review performed; medication list updated in electronic medical record  Type 2 Diabetes - New goal.: Controlled; Most recent A1c at goal of <7% per ADA guidelines Current medications: dapagliflozin (Farxiga) 5 mg by mouth once daily Intolerances:  metformin (unknown) and Farxiga (frequent urination and excessive thirst) Taking medications as directed: yes Side effects thought to be attributed to current medication regimen:  yes Hypoglycemia prevention: not indicated at this time Current meal patterns: not discussed today Current exercise: not discussed today On a statin: yes On aspirin 81 mg daily: no Last microalbumin/creatinine ratio: 105; on an ACEi/ARB: no, but will add ARB Last eye exam: overdue Last foot exam: overdue Pneumonia vaccine: up to date Influenza vaccine:  needs this fall Current glucose readings:  not discussed today Patient identified as a good candidate for a GLP-1 receptor agonist given reduction in cardiovascular disease, slowed chronic kidney disease progression, low risk of hypoglycemia, increased satiety , and weight loss. Patient denies a personal or family history of medullary thyroid carcinoma (MTC) or Multiple Endocrine Neoplasia syndrome type 2 (MEN 2). Patient also denies any history of pancreatitis or biliary disease. Patient just paid for refill of Wilder Glade and is willing to continue for a few more week. Will plan to discontinue after current tablets are  depleted due to undesirable adverse events (frequent urination and excessive thirst). Will plan to switch to Rybelsus. Patient meets income requirements for NovoNordisk patient assistance. Will complete application and leave at front desk for patient to sign.  Patient also instructed to bring lat years tax return for proof of income.  Patient was provided counseling regarding the administration of Rybelsus as well as potential adverse events.  Hypertension - New goal.: Blood pressure under good control. Blood pressure is at goal of <130/80 mmHg per 2017 AHA/ACC guidelines. Current medications: amlodipine 5 mg by mouth once daily Intolerances: none Taking medications as directed: yes Side effects thought to be attributed to current medication regimen: no Current home blood pressure: not discussed today Continue amlodipine 5 mg by mouth once daily Encourage dietary sodium restriction/DASH diet Recommend regular aerobic exercise Recommend home blood pressure monitoring to discuss at next visit Plan to add ARB for elevated urine microalbumin  Hyperlipidemia - New goal.: Uncontrolled. LDL above goal of <70 due to very high risk given diabetes + at least 1 additional major risk factor (diabetes, hypertension, and chronic kidney disease (CKD)) per 2020 AACE/ACE guidelines. Triglycerides at goal of <150 per 2020 AACE/ACE guidelines. Current medications: rosuvastatin 40 mg by mouth once daily Intolerances: none Taking medications as directed: yes Side effects thought to be attributed to current medication regimen: no Continue rosuvastatin 40 mg by mouth once daily Encourage dietary reduction of high fat containing foods such as butter, nuts, bacon, egg yolks, etc. Recommend regular aerobic exercise Re-check lipid panel in 4-12 weeks Consider addition of ezetimibe 10 mg by mouth daily  Chronic Kidney Disease Stage 3a - GFR 45-59 (Mild to Moderately Reduced Function) - New goal.: Suboptimally managed Current medications: dapagliflozin (Farxiga) 5 mg by mouth once daily and rosuvastatin 40 mg by mouth once daily Most recent GFR: 54 mL/min Most recent microalbumin/creatinine ratio: 105 Encouraged healthy diet including more fruits and  vegetables Recommend adequate hydration  Recommend regular aerobic exercise Continue statin. Will plan to discontinue Wilder Glade due to adverse events. Plan to add Rybelsus. Will add ARB due to elevated microalbumin in diabetes  Patient Goals/Self-Care Activities Patient will:  Take medications as prescribed Check blood pressure at least once daily, document, and provide at future appointments Collaborate with provider on medication access solutions Engage in dietary modifications by less frequent dining out and decreased fat intake  Follow Up Plan: Telephone follow up appointment with care management team member scheduled for: 01/14/21        Rybelsus (semaglutide)    Important Administration Instructions Take Rybelsus at least 30 minutes before the first food, beverage, or other oral medications of the day with no more than 4 ounces of plain water only. If you wait less than 30 minutes, or take Rybelsus with food, beverages (other than plain water) or other oral medications, this will decrease the effectiveness of Rybelsus by decreasing its absorption. Swallow tablets whole. Do not split, crush, or chew tablets.   Dosing Start Rybelsus with 3 mg by mouth once daily for 30 days. The 3 mg dose is intended for treatment initiation and is not effective for blood sugar control. After 30 days on the 3 mg dose, increase the dose to 7 mg by mouth once daily. Dose may be increased to 14 mg once daily if additional blood sugar control is needed after at least 30 days on the 7 mg dose. Taking two 7 mg Rybelsus tablets to achieve a 14 mg dose is not recommended. If a  dose is missed, the missed dose should be skipped, and the next dose should be taken the following day.

## 2020-12-24 NOTE — Chronic Care Management (AMB) (Signed)
Chronic Care Management Pharmacy Note  12/24/2020 Name:  [name] MRN:  [id number] DOB:  10/01/1950  Summary:  Type-2 diabetes  Last microalbumin/creatinine ratio: 105; will add ARB Patient identified as a good candidate for a GLP-1 receptor agonist given reduction in cardiovascular disease, slowed chronic kidney disease progression, low risk of hypoglycemia, increased satiety , and weight loss. Patient denies a personal or family history of medullary thyroid carcinoma (MTC) or Multiple Endocrine Neoplasia syndrome type 2 (MEN 2). Patient also denies any history of pancreatitis or biliary disease. Patient just paid for refill of Wilder Glade and is willing to continue for a few more week. Will plan to discontinue after current tablets are depleted due to undesirable adverse events (frequent urination and excessive thirst). Will plan to switch to Rybelsus. Patient meets income requirements for NovoNordisk patient assistance. Will complete application and leave at front desk for patient to sign. Patient also instructed to bring lat years tax return for proof of income.  Patient was provided counseling regarding the administration of Rybelsus as well as potential adverse events.  Hyperlipidemia Continue rosuvastatin 40 mg by mouth once daily Re-check lipid panel in 4-12 weeks Consider addition of ezetimibe 10 mg by mouth daily  Chronic Kidney Disease Stage 3a - GFR 45-59 (Mild to Moderately Reduced Function) - New goal.: Continue statin. Will plan to discontinue Wilder Glade due to adverse events. Plan to add Rybelsus. Will add ARB due to elevated microalbumin in diabetes.   Subjective: [name] is an 70 y.o. year old female who is a primary patient of Luking, Elayne Snare, MD.  The CCM team was consulted for assistance with disease management and care coordination needs.    Engaged with patient by telephone for initial visit in response to provider referral for pharmacy case management  and/or care coordination services.   Consent to Services:  The patient was given the following information about Chronic Care Management services today, agreed to services, and gave verbal consent: 1. CCM service includes personalized support from designated clinical staff supervised by the primary care provider, including individualized plan of care and coordination with other care providers 2. 24/7 contact phone numbers for assistance for urgent and routine care needs. 3. Service will only be billed when office clinical staff spend 20 minutes or more in a month to coordinate care. 4. Only one practitioner may furnish and bill the service in a calendar month. 5.The patient may stop CCM services at any time (effective at the end of the month) by phone call to the office staff. 6. The patient will be responsible for cost sharing (co-pay) of up to 20% of the service fee (after annual deductible is met). Patient agreed to services and consent obtained.  Patient Care Team: Kathyrn Drown, MD as PCP - General (Family Medicine) Gala Romney Cristopher Estimable, MD as Consulting Physician (Gastroenterology) Kassie Mends, RN as [place] Management Beryle Lathe, [facility] (Pharmacist)  Objective:  Lab Results  Component Value Date   CREATININE 1.10 (H) 11/25/2020   CREATININE 1.02 (H) 10/30/2020   CREATININE 1.20 (H) 06/01/2020    Lab Results  Component Value Date   HGBA1C 6.4 (H) 11/25/2020   Last diabetic Eye exam:  Lab Results  Component Value Date/Time   HMDIABEYEEXA No Retinopathy 06/08/2016 12:00 AM    Last diabetic Foot exam: No results found for: HMDIABFOOTEX      Component Value Date/Time   CHOL 143 11/25/2020 0913   TRIG 77 11/25/2020  0913   HDL 48 11/25/2020 0913   CHOLHDL 3.0 11/25/2020 0913   CHOLHDL 5.6 05/28/2014 1018   VLDL 16 05/28/2014 1018   LDLCALC 80 11/25/2020 0913    Hepatic Function Latest Ref Rng & Units 11/25/2020 10/30/2020 06/01/2020  Total Protein  6.0 - 8.5 g/dL 7.3 8.1 7.5  Albumin 3.7 - 4.7 g/dL 4.3 4.4 4.3  AST 0 - 40 IU/L _0 ALT 0 - 32 IU/L _1 Alk Phosphatase 44 - 121 IU/L 127(H) 117 117  Total Bilirubin 0.0 - 1.2 mg/dL 0.5 0.5 0.4  Bilirubin, Direct 0.00 - 0.40 mg/dL 0.15 - -    No results found for: TSH, FREET4  CBC Latest Ref Rng & Units 10/30/2020 04/25/2016 04/25/2016  WBC 4.0 - 10.5 K/uL 6.4 - 5.9  Hemoglobin 12.0 - 15.0 g/dL 14.2 14.3 13.7  Hematocrit 36.0 - 46.0 % 44.5 42.0 40.6  Platelets 150 - 400 K/uL 283 - 277    No results found for: VD25OH  Clinical ASCVD: No  The 10-year ASCVD risk score (Arnett DK, et al., 2019) is: 24.4%   Values used to calculate the score:     Age: 70 years     Sex: Female     Is Non-Hispanic African American: Yes     Diabetic: Yes     Tobacco smoker: No     Systolic Blood Pressure: 846 mmHg     Is BP treated: Yes     HDL Cholesterol: 48 mg/dL     Total Cholesterol: 143 mg/dL    Social History   Tobacco Use  Smoking Status Former   Types: Cigarettes   Quit date: 06/17/1992   Years since quitting: 28.5  Smokeless Tobacco Never  Tobacco Comments   less than a pack a week   BP Readings from Last 3 Encounters:  10/30/20 (!) 142/72  06/01/20 124/70  02/13/20 138/70   Pulse Readings from Last 3 Encounters:  10/30/20 65  02/13/20 77  01/30/20 75   Wt Readings from Last 3 Encounters:  06/01/20 198 lb 12.8 oz (90.2 kg)  02/11/20 199 lb 1.2 oz (90.3 kg)  01/28/20 199 lb (90.3 kg)    Assessment: Review of patient past medical history, allergies, medications, health status, including review of consultants reports, laboratory and other test data, was performed as part of comprehensive evaluation and provision of chronic care management services.   SDOH:  (Social Determinants of Health) assessments and interventions performed:    CCM Care Plan  Allergies  Allergen Reactions   Farxiga [Dapagliflozin] Other (See Comments)    Frequent urination and excessive  thirst    Medications Reviewed Today     Reviewed by Beryle Lathe, [facility] (Pharmacist) on 12/24/20 at 1558  Med List Status: <None>   Medication Order Taking? Sig Documenting Provider Last Dose Status Informant  acetaminophen (TYLENOL) 500 MG tablet [id number] Yes Take 1,000 mg by mouth every 6 (six) hours as needed for moderate pain or headache. [provider] Taking Active Self  amLODipine (NORVASC) 5 MG tablet [id number] Yes TAKE 1 TABLET BY MOUTH EVERY DAY Luking, Scott A, MD Taking Active   amoxicillin (AMOXIL) 500 MG capsule [id number] No Take 500 mg by mouth 3 (three) times daily.  Patient not taking: Reported on 12/24/2020   [provider] Not Taking Active   FARXIGA 5 MG TABS tablet [id number] Yes TAKE 1 TABLET BY MOUTH EVERY DAY Luking, Elayne Snare, MD Taking Active   meclizine (  ANTIVERT) 25 MG tablet [id number] Yes Take 1 tablet (25 mg total) by mouth 3 (three) times daily as needed for dizziness. Fredia Sorrow, MD Taking Active   Polyethyl Glycol-Propyl Glycol [facility] OP) [id number] Yes Place 1 drop into both eyes daily as needed (dry eyes). [provider] Taking Active Self  rosuvastatin (CRESTOR) 40 MG tablet [id number] Yes TAKE 1 TABLET BY MOUTH EVERY DAY Luking, Elayne Snare, MD Taking Active             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, without long-term current use of insulin ([place]) 12/05/2019   Hyperlipidemia associated with type 2 diabetes mellitus ([place]) 12/05/2019   Venous stasis 09/02/2017   Morbid obesity ([place]) 12/06/2015   Pedal edema 08/11/2013   Lumbar pain 08/11/2013   Essential hypertension, benign 05/30/2012   CERVICAL RADICULITIS 09/15/2009    Immunization History  Administered Date(s) Administered   Fluad Quad(high Dose 65+) 12/05/2019   Influenza, Seasonal, Injecte, Preservative Fre 12/06/2015   Influenza,inj,Quad PF,6+  Mos 01/11/2015, 01/03/2017, 11/09/2017, 11/12/2018   Moderna SARS-COV2 Booster Vaccination 02/05/2020   Moderna Sars-Covid-2 Vaccination 04/05/2019, 05/06/2019   Pneumococcal Conjugate-13 10/05/2014   Pneumococcal Polysaccharide-23 12/06/2015    Conditions to be addressed/monitored: HTN, HLD, DMII, and CKD Stage 3a  Care Plan : Medication Management  Updates made by Beryle Lathe, [place] since 12/24/2020 12:00 AM     Problem: T2DM, HTN, HLD, CKD   Priority: High  Onset Date: 12/24/2020     Long-Range Goal: Disease Progression Prevention   Start Date: 12/24/2020  Expected End Date: 01/02/2021  This Visit's Progress: On track  Priority: High  Note:   Current Barriers:  Unable to independently afford treatment regimen Unable to achieve control of hyperlipidemia Suboptimal therapeutic regimen for diabetes and chronic kidney disease  Pharmacist Clinical Goal(s):  Patient will verbalize ability to afford treatment regimen achieve control of hyperlipidemia as evidenced by improved LDL adhere to plan to optimize therapeutic regimen for diabetes and chronic kidney disease as evidenced by report of adherence to recommended medication management changes through collaboration with PharmD and provider.   Interventions: 1:1 collaboration with Kathyrn Drown, MD regarding development and update of comprehensive plan of care as evidenced by provider attestation and co-signature Inter-disciplinary care team collaboration (see longitudinal plan of care) Comprehensive medication review performed; medication list updated in electronic medical record  Type 2 Diabetes - New goal.: Controlled; Most recent A1c at goal of <7% per ADA guidelines Current medications: dapagliflozin (Farxiga) 5 mg by mouth once daily Intolerances:  metformin (unknown) and Farxiga (frequent urination and excessive thirst) Taking medications as directed: yes Side effects thought to be attributed to current  medication regimen:  yes Hypoglycemia prevention: not indicated at this time Current meal patterns: not discussed today Current exercise: not discussed today On a statin: yes On aspirin 81 mg daily: no Last microalbumin/creatinine ratio: 105; on an ACEi/ARB: no, but will add ARB Last eye exam: overdue Last foot exam: overdue Pneumonia vaccine: up to date Influenza vaccine:  needs this fall Current glucose readings:  not discussed today Patient identified as a good candidate for a GLP-1 receptor agonist given reduction in cardiovascular disease, slowed chronic kidney disease progression, low risk of hypoglycemia, increased satiety , and weight loss. Patient denies a personal or family history of medullary thyroid carcinoma (MTC) or Multiple Endocrine Neoplasia syndrome type 2 (MEN 2). Patient also denies any  history of pancreatitis or biliary disease. Patient just paid for refill of Wilder Glade and is willing to continue for a few more week. Will plan to discontinue after current tablets are depleted due to undesirable adverse events (frequent urination and excessive thirst). Will plan to switch to Rybelsus. Patient meets income requirements for NovoNordisk patient assistance. Will complete application and leave at front desk for patient to sign. Patient also instructed to bring lat years tax return for proof of income.  Patient was provided counseling regarding the administration of Rybelsus as well as potential adverse events.  Hypertension - New goal.: Blood pressure under good control. Blood pressure is at goal of <130/80 mmHg per 2017 AHA/ACC guidelines. Current medications: amlodipine 5 mg by mouth once daily Intolerances: none Taking medications as directed: yes Side effects thought to be attributed to current medication regimen: no Current home blood pressure: not discussed today Continue amlodipine 5 mg by mouth once daily Encourage dietary sodium restriction/DASH diet Recommend regular  aerobic exercise Recommend home blood pressure monitoring to discuss at next visit Plan to add ARB for elevated urine microalbumin  Hyperlipidemia - New goal.: Uncontrolled. LDL above goal of <70 due to very high risk given diabetes + at least 1 additional major risk factor (diabetes, hypertension, and chronic kidney disease (CKD)) per 2020 AACE/ACE guidelines. Triglycerides at goal of <150 per 2020 AACE/ACE guidelines. Current medications: rosuvastatin 40 mg by mouth once daily Intolerances: none Taking medications as directed: yes Side effects thought to be attributed to current medication regimen: no Continue rosuvastatin 40 mg by mouth once daily Encourage dietary reduction of high fat containing foods such as butter, nuts, bacon, egg yolks, etc. Recommend regular aerobic exercise Re-check lipid panel in 4-12 weeks Consider addition of ezetimibe 10 mg by mouth daily  Chronic Kidney Disease Stage 3a - GFR 45-59 (Mild to Moderately Reduced Function) - New goal.: Suboptimally managed Current medications: dapagliflozin (Farxiga) 5 mg by mouth once daily and rosuvastatin 40 mg by mouth once daily Most recent GFR: 54 mL/min Most recent microalbumin/creatinine ratio: 105 Encouraged healthy diet including more fruits and vegetables Recommend adequate hydration  Recommend regular aerobic exercise Continue statin. Will plan to discontinue Wilder Glade due to adverse events. Plan to add Rybelsus. Will add ARB due to elevated microalbumin in diabetes  Patient Goals/Self-Care Activities Patient will:  Take medications as prescribed Check blood pressure at least once daily, document, and provide at future appointments Collaborate with provider on medication access solutions Engage in dietary modifications by less frequent dining out and decreased fat intake  Follow Up Plan: Telephone follow up appointment with care management team member scheduled for: 01/14/21      Medication Assistance:   Rybelsus through NovoNordisk   Patient's preferred pharmacy is:  [id number] [place], [id number]: [phone]: [phone] Follow Up:  Patient agrees to Care Plan and Follow-up.  Plan: Telephone follow up appointment with care management team member scheduled for:  01/14/21  CKennon Holter PharmD Clinical Pharmacist [phone]

## 2020-12-27 DIAGNOSIS — N183 Chronic kidney disease, stage 3 unspecified: Secondary | ICD-10-CM

## 2020-12-27 DIAGNOSIS — E785 Hyperlipidemia, unspecified: Secondary | ICD-10-CM

## 2020-12-27 DIAGNOSIS — E1169 Type 2 diabetes mellitus with other specified complication: Secondary | ICD-10-CM

## 2020-12-27 DIAGNOSIS — E119 Type 2 diabetes mellitus without complications: Secondary | ICD-10-CM

## 2020-12-27 DIAGNOSIS — I10 Essential (primary) hypertension: Secondary | ICD-10-CM | POA: Diagnosis not present

## 2020-12-27 DIAGNOSIS — E0822 Diabetes mellitus due to underlying condition with diabetic chronic kidney disease: Secondary | ICD-10-CM | POA: Diagnosis not present

## 2020-12-28 ENCOUNTER — Other Ambulatory Visit: Payer: Self-pay

## 2020-12-28 ENCOUNTER — Telehealth: Payer: Self-pay

## 2020-12-28 ENCOUNTER — Ambulatory Visit (INDEPENDENT_AMBULATORY_CARE_PROVIDER_SITE_OTHER): Payer: Medicare Other

## 2020-12-28 VITALS — Ht 59.0 in | Wt 198.0 lb

## 2020-12-28 DIAGNOSIS — Z0000 Encounter for general adult medical examination without abnormal findings: Secondary | ICD-10-CM | POA: Diagnosis not present

## 2020-12-28 NOTE — Telephone Encounter (Signed)
Pt requests new glucometer, lancets and testing strips. Pt states the monitor she has is from 2017. Thank you!

## 2020-12-28 NOTE — Progress Notes (Signed)
Subjective:   [name] is a 70 y.o. female who presents for Medicare Annual (Subsequent) preventive examination. Virtual Visit via Telephone Note  I connected with  [name] on 12/28/20 at  1:40 PM EDT by telephone and verified that I am speaking with the correct person using two identifiers.  Location: Patient: Home Provider: [facility] Persons participating in the virtual visit: patient/Nurse Health Advisor   I discussed the limitations, risks, security and privacy concerns of performing an evaluation and management service by telephone and the availability of in person appointments. The patient expressed understanding and agreed to proceed.  Interactive audio and video telecommunications were attempted between this nurse and patient, however failed, due to patient having technical difficulties OR patient did not have access to video capability.  We continued and completed visit with audio only.  Some vital signs may be absent or patient reported.   Chriss Driver, LPN  Review of Systems     Cardiac Risk Factors include: advanced age (>98men, >31 women);diabetes mellitus;smoking/ tobacco exposure;dyslipidemia;obesity (BMI >30kg/m2);sedentary lifestyle     Objective:    Today's Vitals   12/28/20 1438  Weight: 198 lb (89.8 kg)  Height: 4\' 11"  (1.499 m)   Body mass index is 39.99 kg/m.  Advanced Directives 12/28/2020 12/08/2020 02/13/2020 02/11/2020 01/28/2020 07/11/2017 04/25/2016  Does Patient Have a Medical Advance Directive? No No No No No No No  Would patient like information on creating a medical advance directive? No - Patient declined Yes (MAU/Ambulatory/Procedural Areas - Information given) No - Patient declined No - Patient declined No - Patient declined No - Patient declined No - Patient declined    Current Medications (verified) Outpatient Encounter Medications as of 12/28/2020  Medication Sig   acetaminophen (TYLENOL) 500 MG tablet Take 1,000 mg by  mouth every 6 (six) hours as needed for moderate pain or headache.   amLODipine (NORVASC) 5 MG tablet TAKE 1 TABLET BY MOUTH EVERY DAY   amoxicillin (AMOXIL) 500 MG capsule Take 500 mg by mouth 3 (three) times daily.   FARXIGA 5 MG TABS tablet TAKE 1 TABLET BY MOUTH EVERY DAY   meclizine (ANTIVERT) 25 MG tablet Take 1 tablet (25 mg total) by mouth 3 (three) times daily as needed for dizziness.   Polyethyl Glycol-Propyl Glycol (SYSTANE OP) Place 1 drop into both eyes daily as needed (dry eyes).   rosuvastatin (CRESTOR) 40 MG tablet TAKE 1 TABLET BY MOUTH EVERY DAY   valsartan (DIOVAN) 40 MG tablet Take 1 tablet (40 mg total) by mouth daily.   No facility-administered encounter medications on file as of 12/28/2020.    Allergies (verified) Farxiga [dapagliflozin]   History: Past Medical History:  Diagnosis Date   Diabetes mellitus without complication ([place])    Hyperlipidemia    Hypertension    Mild sleep apnea    Prediabetes    Prolonged QT interval    Renal disorder    Past Surgical History:  Procedure Laterality Date   ABDOMINAL HYSTERECTOMY     CHOLECYSTECTOMY     COLONOSCOPY     COLONOSCOPY N/A 07/11/2017   Procedure: COLONOSCOPY;  Surgeon: Daneil Dolin, MD;  Location[facility];  Service: Endoscopy;  Laterality: N/A;  8:30   Family History  Problem Relation Age of Onset   Heart disease Mother    Diabetes Mother    Arthritis Father    Cancer Father        prostate   Diabetes Maternal Grandmother  Social History   Socioeconomic History   Marital status: Married    Spouse name: Evette Doffing   Number of children: 2   Years of education: Not on file   Highest education level: Not on file  Occupational History   Not on file  Tobacco Use   Smoking status: Former    Types: Cigarettes    Quit date: 06/17/1992    Years since quitting: 28.5   Smokeless tobacco: Never   Tobacco comments:    less than a pack a week  Vaping Use   Vaping Use: Never used  Substance  and Sexual Activity   Alcohol use: No   Drug use: No   Sexual activity: Yes  Other Topics Concern   Not on file  Social History Narrative   3 grandchildren and 2 great grandchildren.   Social Determinants of Health   Financial Resource Strain: Low Risk    Difficulty of Paying Living Expenses: Not hard at all  Food Insecurity: No Food Insecurity   Worried About Charity fundraiser in the Last Year: Never true   [place] in the Last Year: Never true  Transportation Needs: No Transportation Needs   Lack of Transportation (Medical): No   Lack of Transportation (Non-Medical): No  Physical Activity: Insufficiently Active   Days of Exercise per Week: 3 days   Minutes of Exercise per Session: 30 min  Stress: No Stress Concern Present   Feeling of Stress : Only a little  Social Connections: Engineer, building services of Communication with Friends and Family: Twice a week   Frequency of Social Gatherings with Friends and Family: Twice a week   Attends Religious Services: More than 4 times per year   Active Member of [employer] or Organizations: Yes   Attends Music therapist: More than 4 times per year   Marital Status: Married    Tobacco Counseling Counseling given: Not Answered Tobacco comments: less than a pack a week   Clinical Intake:  Pre-visit preparation completed: Yes  Pain : No/denies pain     BMI - recorded: 39.99 Nutritional Status: BMI > 30  Obese Nutritional Risks: None Diabetes: Yes  How often do you need to have someone help you when you read instructions, pamphlets, or other written materials from your doctor or pharmacy?: 1 - Never  Diabetic?Nutrition Risk Assessment:  Has the patient had any N/V/D within the last 2 months?  No  Does the patient have any non-healing wounds?  No  Has the patient had any unintentional weight loss or weight gain?  No   Diabetes:  Is the patient diabetic?  Yes  If diabetic, was a CBG obtained  today?  No  Did the patient bring in their glucometer from home?  No  Phone visit How often do you monitor your CBG's? Daily.   Financial Strains and Diabetes Management:  Are you having any financial strains with the device, your supplies or your medication? No .  Does the patient want to be seen by Chronic Care Management for management of their diabetes?  No  Would the patient like to be referred to a Nutritionist or for Diabetic Management?  No   Diabetic Exams:  Diabetic Eye Exam: Completed 04/2019. Overdue for diabetic eye exam. Pt has been advised about the importance in completing this exam.   Diabetic Foot Exam: Completed 12/05/2019. Pt has been advised about the importance in completing this exam. Information entered by :: Mj Sharmon Cheramie,  LPN   Activities of Daily Living In your present state of health, do you have any difficulty performing the following activities: 12/28/2020 12/24/2020  Hearing? N N  Vision? N N  Difficulty concentrating or making decisions? N N  Comment Remebering at times. -  Walking or climbing stairs? N N  Dressing or bathing? N N  Doing errands, shopping? N N  Preparing Food and eating ? N N  Using the Toilet? N N  In the past six months, have you accidently leaked urine? Y Y  Comment Urgency. -  Do you have problems with loss of bowel control? N N  Managing your Medications? N N  Managing your Finances? N N  Housekeeping or managing your Housekeeping? N N  Some recent data might be hidden    Patient Care Team: Kathyrn Drown, MD as PCP - General (Family Medicine) Gala Romney Cristopher Estimable, MD as Consulting Physician (Gastroenterology) Kassie Mends, RN as [place] Management Beryle Lathe, [facility] (Pharmacist)  Indicate any recent Medical Services you may have received from other than [facility] providers in the past year (date may be approximate).     Assessment:   This is a routine wellness examination for  Dan[place].  Hearing/Vision screen Hearing Screening - Comments:: No hearing issues.  Vision Screening - Comments:: Glasses. Dr. Radford Pax in [place]. Due March 2023.  Dietary issues and exercise activities discussed: Current Exercise Habits: Home exercise routine, Type of exercise: walking, Time (Minutes): 20, Frequency (Times/Week): 3, Weekly Exercise (Minutes/Week): 60, Intensity: Mild, Exercise limited by: cardiac condition(s)   Goals Addressed             This Visit's Progress    Medication Management   On track    Patient Goals/Self-Care Activities Patient will:  Take medications as prescribed Check blood pressure at least once daily, document, and provide at future appointments Collaborate with provider on medication access solutions Engage in dietary modifications by less frequent dining out and decreased fat intake     Monitor and Manage My Blood Sugar-Diabetes Type 2   On track    Timeframe:  Long-Range Goal Priority:  Medium Start Date:               12/08/2020              Expected End Date:     06/08/2021                  Follow Up Date 01/12/2021   - check blood sugar at prescribed times - check blood sugar if I feel it is too high or too low - take the blood sugar log to all doctor visits - take the blood sugar meter to all doctor visits  - look over and complete advanced directives packet mailed to you - call RN care manager with any questions at [phone] - follow up with primary care provider about new glucometer/ call pharmacy to see if ready for pickup - expect a call from pharmacist for costs of farxiga and unwanted side effects   Why is this important?   Checking your blood sugar at home helps to keep it from getting very high or very low.  Writing the results in a diary or log helps the doctor know how to care for you.  Your blood sugar log should have the time, date and the results.  Also, write down the amount of insulin or other medicine that you take.   Other information,  like what you ate, exercise done and how you were feeling, will also be helpful.     Notes:      Track and Manage My Blood Pressure-Hypertension   On track    Timeframe:  Long-Range Goal Priority:  Medium Start Date:    12/08/2020                         Expected End Date:    06/08/2021                   Follow Up Date 01/12/2021   - check blood pressure 3 times per week - choose a place to take my blood pressure (home, clinic or office, retail store) - write blood pressure results in a log or diary  - look over education sent via My Chart- low sodium diet - try to do some type of exercise daily- walking is good   Why is this important?   You won't feel high blood pressure, but it can still hurt your blood vessels.  High blood pressure can cause heart or kidney problems. It can also cause a stroke.  Making lifestyle changes like losing a little weight or eating less salt will help.  Checking your blood pressure at home and at different times of the day can help to control blood pressure.  If the doctor prescribes medicine remember to take it the way the doctor ordered.  Call the office if you cannot afford the medicine or if there are questions about it.     Notes:        Depression Screen PHQ 2/9 Scores 12/28/2020 12/13/2020 12/08/2020 12/08/2020 06/01/2020 12/05/2019 05/07/2018  PHQ - 2 Score 0 0 0 0 0 0 0    Fall Risk Fall Risk  12/28/2020 12/24/2020 12/13/2020 12/08/2020 12/05/2019  Falls in the past year? 0 0 0 0 0  Comment - - - - -  Number falls in past yr: 0 0 0 - -  Injury with Fall? 0 0 0 - -  Risk for fall due to : No Fall Risks - No Fall Risks - -  Follow up Falls prevention discussed - Falls evaluation completed - Falls evaluation completed    FALL RISK PREVENTION PERTAINING TO THE HOME:  Any stairs in or around the home? Yes  If so, are there any without handrails? No  Home free of loose throw rugs in walkways, pet beds, electrical cords,  etc? Yes  Adequate lighting in your home to reduce risk of falls? Yes   ASSISTIVE DEVICES UTILIZED TO PREVENT FALLS:  Life alert? No  Use of a cane, walker or w/c? No  Grab bars in the bathroom? No  Shower chair or bench in shower? No  Elevated toilet seat or a handicapped toilet? Yes   TIMED UP AND GO:  Was the test performed? No . Phone visit.    Cognitive Function: Normal cognitive status assessed by direct observation by this Nurse Health Advisor. No abnormalities found.          Immunizations Immunization History  Administered Date(s) Administered   Fluad Quad(high Dose 65+) 12/05/2019   Influenza, Seasonal, Injecte, Preservative Fre 12/06/2015   Influenza,inj,Quad PF,6+ Mos 01/11/2015, 01/03/2017, 11/09/2017, 11/12/2018   Moderna SARS-COV2 Booster Vaccination 02/05/2020   Moderna Sars-Covid-2 Vaccination 04/05/2019, 05/06/2019   Pneumococcal Conjugate-13 10/05/2014   Pneumococcal Polysaccharide-23 12/06/2015    TDAP status: Due, Education has been provided regarding the importance of this  vaccine. Advised may receive this vaccine at local pharmacy or Health Dept. Aware to provide a copy of the vaccination record if obtained from local pharmacy or Health Dept. Verbalized acceptance and understanding.  Flu Vaccine status: Due, Education has been provided regarding the importance of this vaccine. Advised may receive this vaccine at local pharmacy or Health Dept. Aware to provide a copy of the vaccination record if obtained from local pharmacy or Health Dept. Verbalized acceptance and understanding.  Pneumococcal vaccine status: Up to date  Covid-19 vaccine status: Information provided on how to obtain vaccines.   Qualifies for Shingles Vaccine? Yes   Zostavax completed No   Shingrix Completed?: No.    Education has been provided regarding the importance of this vaccine. Patient has been advised to call insurance company to determine out of pocket expense if they have  not yet received this vaccine. Advised may also receive vaccine at local pharmacy or Health Dept. Verbalized acceptance and understanding.  Screening Tests Health Maintenance  Topic Date Due   TETANUS/TDAP  Never done   Zoster Vaccines- Shingrix (1 of 2) Never done   OPHTHALMOLOGY EXAM  06/08/2017   COVID-19 Vaccine (3 - Moderna risk series) 03/04/2020   INFLUENZA VACCINE  09/27/2020   FOOT EXAM  12/04/2020   HEMOGLOBIN A1C  05/25/2021   MAMMOGRAM  01/25/2022   COLONOSCOPY (Pts 45-46yrs Insurance coverage will need to be confirmed)  07/12/2022   Pneumonia Vaccine 17+ Years old  Completed   DEXA SCAN  Completed   Hepatitis C Screening  Completed   HPV VACCINES  Aged Out    Health Maintenance  Health Maintenance Due  Topic Date Due   TETANUS/TDAP  Never done   Zoster Vaccines- Shingrix (1 of 2) Never done   OPHTHALMOLOGY EXAM  06/08/2017   COVID-19 Vaccine (3 - Moderna risk series) 03/04/2020   INFLUENZA VACCINE  09/27/2020   FOOT EXAM  12/04/2020    Colorectal cancer screening: Type of screening: Colonoscopy. Completed 07/11/2017. Repeat every 5 years  Mammogram status: Completed 01/26/2020. Repeat every year  Bone Density status: Completed 12/23/2015. Results reflect: Bone density results: NORMAL. Repeat every 2 years.  Lung Cancer Screening: (Low Dose CT Chest recommended if Age 56-80 years, 30 pack-year currently smoking OR have quit w/in 15years.) does not qualify.   Additional Screening:  Hepatitis C Screening: does qualify; Completed 11/01/2016  Vision Screening: Recommended annual ophthalmology exams for early detection of glaucoma and other disorders of the eye. Is the patient up to date with their annual eye exam?  Yes  Who is the provider or what is the name of the office in which the patient attends annual eye exams? Dr. Radford Pax in [place] If pt is not established with a provider, would they like to be referred to a provider to establish care? No .   Dental  Screening: Recommended annual dental exams for proper oral hygiene  Community Resource Referral / Chronic Care Management: CRR required this visit?  No   CCM required this visit?  No      Plan:     I have personally reviewed and noted the following in the patient's chart:   Medical and social history Use of alcohol, tobacco or illicit drugs  Current medications and supplements including opioid prescriptions.  Functional ability and status Nutritional status Physical activity Advanced directives List of other physicians Hospitalizations, surgeries, and ER visits in previous 12 months Vitals Screenings to include cognitive, depression, and falls Referrals and appointments  In  addition, I have reviewed and discussed with patient certain preventive protocols, quality metrics, and best practice recommendations. A written personalized care plan for preventive services as well as general preventive health recommendations were provided to patient.     Chriss Driver, LPN   26/04/3352   Nurse Notes: Pt states she is doing well. Up to date on health maintenance. Discussed shingles, flu and covid boosters.

## 2020-12-31 MED ORDER — BLOOD GLUCOSE METER KIT: PACK | 0 refills | Status: DC

## 2020-12-31 NOTE — Telephone Encounter (Signed)
Please write out I will sign, or if can order via electronics go ahead and order

## 2020-12-31 NOTE — Addendum Note (Signed)
Addended by: Vicente Males on: 12/31/2020 01:28 PM   Modules accepted: Orders

## 2020-12-31 NOTE — Telephone Encounter (Signed)
Blood glucose meter and supplies sent to [facility]. Left message to return call

## 2021-01-03 DIAGNOSIS — Z23 Encounter for immunization: Secondary | ICD-10-CM | POA: Diagnosis not present

## 2021-01-04 NOTE — Telephone Encounter (Signed)
Sent a mychart message  to patient

## 2021-01-05 NOTE — Telephone Encounter (Signed)
Pt replied via my chart and states she was able to get supplies.

## 2021-01-12 ENCOUNTER — Ambulatory Visit (INDEPENDENT_AMBULATORY_CARE_PROVIDER_SITE_OTHER): Payer: Medicare Other | Admitting: *Deleted

## 2021-01-12 DIAGNOSIS — I10 Essential (primary) hypertension: Secondary | ICD-10-CM

## 2021-01-12 DIAGNOSIS — E119 Type 2 diabetes mellitus without complications: Secondary | ICD-10-CM

## 2021-01-12 NOTE — Chronic Care Management (AMB) (Signed)
Chronic Care Management   CCM RN Visit Note  01/12/2021 Name: [name] MRN: [id number] DOB: 09/05/1950  Subjective: [name] is a 70 y.o. year old female who is a primary care patient of Luking, Elayne Snare, MD. The care management team was consulted for assistance with disease management and care coordination needs.    Engaged with patient by telephone for follow up visit in response to provider referral for case management and/or care coordination services.   Consent to Services:  The patient was given information about Chronic Care Management services, agreed to services, and gave verbal consent prior to initiation of services.  Please see initial visit note for detailed documentation.   Patient agreed to services and verbal consent obtained.   Assessment: Review of patient past medical history, allergies, medications, health status, including review of consultants reports, laboratory and other test data, was performed as part of comprehensive evaluation and provision of chronic care management services.   SDOH (Social Determinants of Health) assessments and interventions performed:    CCM Care Plan  Allergies  Allergen Reactions   Farxiga [Dapagliflozin] Other (See Comments)    Frequent urination and excessive thirst    Outpatient Encounter Medications as of 01/12/2021  Medication Sig   acetaminophen (TYLENOL) 500 MG tablet Take 1,000 mg by mouth every 6 (six) hours as needed for moderate pain or headache.   amLODipine (NORVASC) 5 MG tablet TAKE 1 TABLET BY MOUTH EVERY DAY   blood glucose meter kit and supplies Dispense based on patient and insurance preference. Use to check blood sugars once daily . (FOR ICD-10 E10.9, E11.9).   FARXIGA 5 MG TABS tablet TAKE 1 TABLET BY MOUTH EVERY DAY   meclizine (ANTIVERT) 25 MG tablet Take 1 tablet (25 mg total) by mouth 3 (three) times daily as needed for dizziness.   Polyethyl Glycol-Propyl Glycol (SYSTANE OP) Place 1 drop into  both eyes daily as needed (dry eyes).   rosuvastatin (CRESTOR) 40 MG tablet TAKE 1 TABLET BY MOUTH EVERY DAY   valsartan (DIOVAN) 40 MG tablet Take 1 tablet (40 mg total) by mouth daily.   amoxicillin (AMOXIL) 500 MG capsule Take 500 mg by mouth 3 (three) times daily. (Patient not taking: Reported on 01/12/2021)   No facility-administered encounter medications on file as of 01/12/2021.    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, without long-term current use of insulin ([place]) 12/05/2019   Hyperlipidemia associated with type 2 diabetes mellitus ([place]) 12/05/2019   Venous stasis 09/02/2017   Morbid obesity ([place]) 12/06/2015   Pedal edema 08/11/2013   Lumbar pain 08/11/2013   Essential hypertension, benign 05/30/2012   CERVICAL RADICULITIS 09/15/2009    Conditions to be addressed/monitored:HTN and DMII  Care Plan : RN Care Manager plan of care  Updates made by Kassie Mends, RN since 01/12/2021 12:00 AM     Problem: No plan of care established for management of chronic disease states (DM2, HTN)   Priority: High     Long-Range Goal: Development of plan of care for chronic disease management (DM2, HTN)   Start Date: 01/12/2021  Expected End Date: 07/11/2021  Priority: High  Note:   Current Barriers:  Knowledge Deficits related to plan of care for management of HTN and DMII  Chronic Disease Management support and education needs related to HTN and DMII Knowledge Deficits related to basic Diabetes pathophysiology and self care/management-  pt reports she lives with her spouse and she is independent with all aspects of her care, pt reports she received advanced directives packet but has not completed yet. Knowledge Deficits related to medications used for management of diabetes- pt reports she cannot afford farxiga at 142$/ month and is having unwanted side effects with excessive/ frequent  urination that prevents her from sleeping. 11/16 update- pt reports she is sleeping somewhat better and will be using all of her prescription for farxiga and then she is supposed to start a new medication and has completed paperwork for assistance with help of CCM pharmacist. Does not use cbg meter-  11/16 update- pt did obtain CBG monitor and is checking CBG BID with am readings in low 100's range and random readings in 140's range Does not adhere to provider recommendations- does not always eat carbohydrate modified/ low sodium diet Does not contact provider office for questions/concerns Knowledge Deficits related to basic understanding of hypertension pathophysiology and self care management- pt reports she does not check blood pressure often.  RNCM Clinical Goal(s):  Patient will verbalize understanding of plan for management of HTN and DMII as evidenced by patient report, review of EHR take all medications exactly as prescribed and will call provider for medication related questions as evidenced by patient report, review of EHR    attend all scheduled medical appointments: 11/18 CCM pharmacist as evidenced by patient report, review of EHR and        through collaboration with RN Care manager, provider, and care team.   Interventions: 1:1 collaboration with primary care provider regarding development and update of comprehensive plan of care as evidenced by provider attestation and co-signature Inter-disciplinary care team collaboration (see longitudinal plan of care) Evaluation of current treatment plan related to  self management and patient's adherence to plan as established by provider  Hypertension Interventions: Last practice recorded BP readings:  BP Readings from Last 3 Encounters:  10/30/20 (!) 142/72  06/01/20 124/70  02/13/20 138/70  Most recent eGFR/CrCl:  Lab Results  Component Value Date   EGFR 54 (L) 11/25/2020    No components found for: CRCL  Evaluation of current  treatment plan related to hypertension self management and patient's adherence to plan as established by provider; Reviewed medications with patient and discussed importance of compliance; Counseled on the importance of exercise goals with target of 150 minutes per week Discussed complications of poorly controlled blood pressure such as heart disease, stroke, circulatory complications, vision complications, kidney impairment, sexual dysfunction;  Reinforced low sodium diet Encouraged patient to check blood pressure at least 3 x per week Reviewed all upcoming scheduled appointments  Diabetes Interventions: Assessed patient's understanding of A1c goal: <7% Reviewed medications with patient and discussed importance of medication adherence; Counseled on importance of regular laboratory monitoring as prescribed; Reviewed scheduled/upcoming provider appointments including: 11/18 CCM pharmacist,  11/21 primary care provider; Review of patient status, including review of consultants reports, relevant laboratory and other test results, and medications completed; Reinforced carbohydrate modified diet and food choices Reviewed signs/ symptoms hypo and hyperglycemia and actions to take Verified patient did get new glucometer  Lab Results  Component Value Date   HGBA1C 6.4 (H) 11/25/2020     Patient Goals/Self-Care Activities: Patient will self administer medications as prescribed as evidenced by self report/primary caregiver report  Patient will attend all scheduled provider appointments as evidenced by clinician review of documented attendance to scheduled appointments and patient/caregiver report Patient will continue to perform ADL's independently as evidenced  by patient/caregiver report Patient will continue to perform IADL's independently as evidenced by patient/caregiver report Patient will call provider office for new concerns or questions as evidenced by review of documented incoming  telephone call notes and patient report check blood sugar at prescribed times: twice daily enter blood sugar readings and medication or insulin into daily log take the blood sugar log to all doctor visits take the blood sugar meter to all doctor visits trim toenails straight across fill half of plate with vegetables manage portion size - check blood pressure 3 times per week - choose a place to take my blood pressure (home, clinic or office, retail store) - write blood pressure results in a log or diary - take blood pressure log to all doctor appointments - call doctor for signs and symptoms of high blood pressure - keep all doctor appointments - eat more whole grains, fruits and vegetables, lean meats and healthy fats If you need assistance completing advanced directives please call RN care manager at [phone] Follow low sodium diet- read food labels Practice good handwashing and wear a mask as needed  Follow Up Plan:  Telephone follow up appointment with care management team member scheduled for:  02/23/2021       Plan:Telephone follow up appointment with care management team member scheduled for:  02/23/2021  Jacqlyn Larsen [facility], BSN RN Case Manager [place] [phone]

## 2021-01-12 NOTE — Patient Instructions (Signed)
Visit Information  Patient will self administer medications as prescribed as evidenced by self report/primary caregiver report  Patient will attend all scheduled provider appointments as evidenced by clinician review of documented attendance to scheduled appointments and patient/caregiver report Patient will continue to perform ADL's independently as evidenced by patient/caregiver report Patient will continue to perform IADL's independently as evidenced by patient/caregiver report Patient will call provider office for new concerns or questions as evidenced by review of documented incoming telephone call notes and patient report check blood sugar at prescribed times: twice daily enter blood sugar readings and medication or insulin into daily log take the blood sugar log to all doctor visits take the blood sugar meter to all doctor visits trim toenails straight across fill half of plate with vegetables manage portion size - check blood pressure 3 times per week - choose a place to take my blood pressure (home, clinic or office, retail store) - write blood pressure results in a log or diary - take blood pressure log to all doctor appointments - call doctor for signs and symptoms of high blood pressure - keep all doctor appointments - eat more whole grains, fruits and vegetables, lean meats and healthy fats  Patient verbalizes understanding of instructions provided today and agrees to view in [place].   Telephone follow up appointment with care management team member scheduled for:   02/23/2021  Jacqlyn Larsen [facility], BSN RN Case Manager [place] [phone]

## 2021-01-13 LAB — BASIC METABOLIC PANEL
BUN/Creatinine Ratio: 16 (ref 12–28)
BUN: 15 mg/dL (ref 8–27)
CO2: 22 mmol/L (ref 20–29)
Calcium: 9.1 mg/dL (ref 8.7–10.3)
Chloride: 109 mmol/L — ABNORMAL HIGH (ref 96–106)
Creatinine, Ser: 0.95 mg/dL (ref 0.57–1.00)
Glucose: 106 mg/dL — ABNORMAL HIGH (ref 70–99)
Potassium: 4 mmol/L (ref 3.5–5.2)
Sodium: 142 mmol/L (ref 134–144)
eGFR: 64 mL/min/{1.73_m2} (ref >59)

## 2021-01-14 ENCOUNTER — Ambulatory Visit: Payer: Medicare Other | Admitting: Pharmacist

## 2021-01-14 DIAGNOSIS — R7989 Other specified abnormal findings of blood chemistry: Secondary | ICD-10-CM

## 2021-01-14 DIAGNOSIS — E119 Type 2 diabetes mellitus without complications: Secondary | ICD-10-CM

## 2021-01-14 DIAGNOSIS — R809 Proteinuria, unspecified: Secondary | ICD-10-CM

## 2021-01-14 DIAGNOSIS — E1169 Type 2 diabetes mellitus with other specified complication: Secondary | ICD-10-CM

## 2021-01-14 DIAGNOSIS — E785 Hyperlipidemia, unspecified: Secondary | ICD-10-CM

## 2021-01-14 DIAGNOSIS — I10 Essential (primary) hypertension: Secondary | ICD-10-CM

## 2021-01-14 NOTE — Chronic Care Management (AMB) (Signed)
Chronic Care Management Pharmacy Note  01/14/2021 Name:  [name] MRN:  [id number] DOB:  [birth year]  Summary:  Type 2 Diabetes Last eye exam: overdue Last foot exam: overdue Influenza vaccine: needs this fall Current glucose readings: fasting: 102-110 over last 5 days; pre/post-prandial afternoon 116-144 Patient only has 1 pill of Farxiga left and wants to know if she should pay for refill since our plan has been to switch her to Rybelsus. Patient assistance application for Rybelsus still pending. Will likely not received medication for another few weeks. Patient plans to discuss options with PCP on Monday but options include 1. refilling/continuing Wilder Glade for 1 more month, 2. prescribe glipizide XL daily with breakfast for 1 month, or 3. continue without diabetes pharmacotherapy until Rybelsus arrives.    Hyperlipidemia LDL above goal of <70 due to very high risk given diabetes + at least 1 additional major risk factor (diabetes, hypertension, and chronic kidney disease (CKD)) per 2020 AACE/ACE guidelines. Triglycerides at goal of <150 per 2020 AACE/ACE guidelines. Continue rosuvastatin 40 mg by mouth once daily Consider addition of ezetimibe 10 mg by mouth daily  Chronic Kidney Disease Follows with Dr. Marval Regal. Plans to see him next in Jan/Feb 2023 ARB recently added. Repeat BMP with stable/improved renal function. Most recent GFR: 64 mL/min Continue ARB + statin. Will plan to discontinue Wilder Glade due to adverse events. Plan to add Rybelsus.  Other 20 minutes spent on the phone with [employer] to discuss supposed missing patient signature/date on patient assistance application and to get status of application update  Subjective: [name] is an 70 y.o. year old female who is a primary patient of Luking, Elayne Snare, MD.  The CCM team was consulted for assistance with disease management and care coordination needs.    Engaged with patient by telephone for follow up  visit in response to provider referral for pharmacy case management and/or care coordination services.   Consent to Services:  The patient was given information about Chronic Care Management services, agreed to services, and gave verbal consent prior to initiation of services.  Please see initial visit note for detailed documentation.   Patient Care Team: Kathyrn Drown, MD as PCP - General (Family Medicine) Gala Romney Cristopher Estimable, MD as Consulting Physician (Gastroenterology) Kassie Mends, RN as [facility] Beryle Lathe, [facility] (Pharmacist)  Objective:  Lab Results  Component Value Date   CREATININE 0.95 01/12/2021   CREATININE 1.10 (H) 11/25/2020   CREATININE 1.02 (H) 10/30/2020    Lab Results  Component Value Date   HGBA1C 6.4 (H) 11/25/2020   Last diabetic Eye exam:  Lab Results  Component Value Date/Time   HMDIABEYEEXA No Retinopathy 06/08/2016 12:00 AM    Last diabetic Foot exam: No results found for: HMDIABFOOTEX      Component Value Date/Time   CHOL 143 11/25/2020 0913   TRIG 77 11/25/2020 0913   HDL 48 11/25/2020 0913   CHOLHDL 3.0 11/25/2020 0913   CHOLHDL 5.6 05/28/2014 1018   VLDL 16 05/28/2014 1018   LDLCALC 80 11/25/2020 0913    Hepatic Function Latest Ref Rng & Units 11/25/2020 10/30/2020 06/01/2020  Total Protein 6.0 - 8.5 g/dL 7.3 8.1 7.5  Albumin 3.7 - 4.7 g/dL 4.3 4.4 4.3  AST 0 - 40 IU/L $Remov'19 21 21  'BHsGUi$ ALT 0 - 32 IU/L $Remov'16 20 17  'eoUuVV$ Alk Phosphatase 44 - 121 IU/L 127(H) 117 117  Total Bilirubin 0.0 - 1.2 mg/dL 0.5 0.5 0.4  Bilirubin, Direct 0.00 - 0.40 mg/dL 0.15 - -    No results found for: TSH, FREET4  CBC Latest Ref Rng & Units 10/30/2020 04/25/2016 04/25/2016  WBC 4.0 - 10.5 K/uL 6.4 - 5.9  Hemoglobin 12.0 - 15.0 g/dL 14.2 14.3 13.7  Hematocrit 36.0 - 46.0 % 44.5 42.0 40.6  Platelets 150 - 400 K/uL 283 - 277    No results found for: VD25OH  Clinical ASCVD: No  The 10-year ASCVD risk score (Arnett DK, et al., 2019)  is: 24.4%   Values used to calculate the score:     Age: 31 years     Sex: Female     Is Non-Hispanic African American: Yes     Diabetic: Yes     Tobacco smoker: No     Systolic Blood Pressure: 017 mmHg     Is BP treated: Yes     HDL Cholesterol: 48 mg/dL     Total Cholesterol: 143 mg/dL    Social History   Tobacco Use  Smoking Status Former   Types: Cigarettes   Quit date: 06/17/1992   Years since quitting: 28.5  Smokeless Tobacco Never  Tobacco Comments   less than a pack a week   BP Readings from Last 3 Encounters:  10/30/20 (!) 142/72  06/01/20 124/70  02/13/20 138/70   Pulse Readings from Last 3 Encounters:  10/30/20 65  02/13/20 77  01/30/20 75   Wt Readings from Last 3 Encounters:  12/28/20 198 lb (89.8 kg)  06/01/20 198 lb 12.8 oz (90.2 kg)  02/11/20 199 lb 1.2 oz (90.3 kg)    Assessment: Review of patient past medical history, allergies, medications, health status, including review of consultants reports, laboratory and other test data, was performed as part of comprehensive evaluation and provision of chronic care management services.   SDOH:  (Social Determinants of Health) assessments and interventions performed:    CCM Care Plan  Allergies  Allergen Reactions   Farxiga [Dapagliflozin] Other (See Comments)    Frequent urination and excessive thirst    Medications Reviewed Today     Reviewed by Kassie Mends, RN (Registered Nurse) on 01/12/21 at 1334  Med List Status: <None>   Medication Order Taking? Sig Documenting Provider Last Dose Status Informant  acetaminophen (TYLENOL) 500 MG tablet [id number] Yes Take 1,000 mg by mouth every 6 (six) hours as needed for moderate pain or headache. [provider] Taking Active   amLODipine (NORVASC) 5 MG tablet [id number] Yes TAKE 1 TABLET BY MOUTH EVERY DAY Luking, Scott A, MD Taking Active   amoxicillin (AMOXIL) 500 MG capsule [id number] No Take 500 mg by mouth 3 (three) times daily.  Patient not  taking: Reported on 01/12/2021   [provider] Not Taking Active   blood glucose meter kit and supplies [id number] Yes Dispense based on patient and insurance preference. Use to check blood sugars once daily . (FOR ICD-10 E10.9, E11.9). Kathyrn Drown, MD Taking Active   FARXIGA 5 MG TABS tablet [id number] Yes TAKE 1 TABLET BY MOUTH EVERY DAY Luking, Elayne Snare, MD Taking Active   meclizine (ANTIVERT) 25 MG tablet [id number] Yes Take 1 tablet (25 mg total) by mouth 3 (three) times daily as needed for dizziness. Fredia Sorrow, MD Taking Active   Polyethyl Glycol-Propyl Glycol [facility] OP) [id number] Yes Place 1 drop into both eyes daily as needed (dry eyes). [provider] Taking Active   rosuvastatin (CRESTOR) 40 MG tablet [id number] Yes TAKE 1 TABLET BY MOUTH  EVERY DAY Kathyrn Drown, MD Taking Active   valsartan (DIOVAN) 40 MG tablet [id number] Yes Take 1 tablet (40 mg total) by mouth daily. Kathyrn Drown, MD Taking Active             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, without long-term current use of insulin ([place]) 12/05/2019   Hyperlipidemia associated with type 2 diabetes mellitus ([place]) 12/05/2019   Venous stasis 09/02/2017   Morbid obesity ([place]) 12/06/2015   Pedal edema 08/11/2013   Lumbar pain 08/11/2013   Essential hypertension, benign 05/30/2012   CERVICAL RADICULITIS 09/15/2009    Immunization History  Administered Date(s) Administered   Fluad Quad(high Dose 65+) 12/05/2019   Influenza, Seasonal, Injecte, Preservative Fre 12/06/2015   Influenza,inj,Quad PF,6+ Mos 01/11/2015, 01/03/2017, 11/09/2017, 11/12/2018   Moderna SARS-COV2 Booster Vaccination 02/05/2020   Moderna Sars-Covid-2 Vaccination 04/05/2019, 05/06/2019   Pneumococcal Conjugate-13 10/05/2014   Pneumococcal Polysaccharide-23 12/06/2015    Conditions to be addressed/monitored: HTN, HLD,  DMII, and CKD Stage 3a  Care Plan : Medication Management  Updates made by Beryle Lathe, [place] since 01/14/2021 12:00 AM     Problem: T2DM, HTN, HLD, CKD   Priority: High  Onset Date: 12/24/2020     Long-Range Goal: Disease Progression Prevention   Start Date: 12/24/2020  Expected End Date: 01/02/2021  Recent Progress: On track  Priority: High  Note:   Current Barriers:  Unable to independently afford treatment regimen Unable to achieve control of hyperlipidemia  Pharmacist Clinical Goal(s):  Patient will Verbalize ability to afford treatment regimen Achieve control of hyperlipidemia as evidenced by improved LDL through collaboration with PharmD and provider.   Interventions: 1:1 collaboration with Kathyrn Drown, MD regarding development and update of comprehensive plan of care as evidenced by provider attestation and co-signature Inter-disciplinary care team collaboration (see longitudinal plan of care) Comprehensive medication review performed; medication list updated in electronic medical record  Type 2 Diabetes - Goal on Track (progressing): YES.: Controlled; Most recent A1c at goal of <7% per ADA guidelines Current medications: dapagliflozin (Farxiga) 5 mg by mouth once daily - patient only has 1 pill left; patient plans to discuss options with PCP on office visit 01/17/21. Intolerances:  metformin (unknown) and Farxiga (frequent urination and excessive thirst) Taking medications as directed: yes Side effects thought to be attributed to current medication regimen:  yes Hypoglycemia prevention: not indicated at this time Current meal patterns: not discussed today Current exercise: not discussed today On a statin: yes On aspirin 81 mg daily: no Last microalbumin/creatinine ratio: 105; on an ACEi/ARB: yes Last eye exam: overdue Last foot exam: overdue Pneumonia vaccine: up to date Influenza vaccine:  needs this fall Current glucose readings:  fasting: 102-110  over last 5 days; pre/post-prandial afternoon 116-144 Patient identified as a good candidate for a GLP-1 receptor agonist given reduction in cardiovascular disease, slowed chronic kidney disease progression, low risk of hypoglycemia, increased satiety , and weight loss. Patient denies a personal or family history of medullary thyroid carcinoma (MTC) or Multiple Endocrine Neoplasia syndrome type 2 (MEN 2). Patient also denies any history of pancreatitis or biliary disease. Patient only has 1 pill of Farxiga left and wants to know if she should pay for refill since our plan has been to switch her to Rybelsus. Patient assistance application for Rybelsus still pending. Will likely not received medication for another few weeks. Patient plans to  discuss options with PCP on Monday but options include 1. refilling/continuing Wilder Glade for 1 more month, 2. prescribe glipizide XL daily with breakfast for 1 month, or 3. continue without diabetes pharmacotherapy until Rybelsus arrives.    Hypertension - Condition stable. Not addressed this visit.: Blood pressure under good control. Blood pressure is at goal of <130/80 mmHg per 2017 AHA/ACC guidelines. Current medications: valsartan 40 mg by mouth once daily and amlodipine 5 mg by mouth once daily Intolerances: none Taking medications as directed: yes Side effects thought to be attributed to current medication regimen: no Current home blood pressure: not discussed today Continue valsartan 40 mg by mouth once daily and amlodipine 5 mg by mouth once daily Encourage dietary sodium restriction/DASH diet Recommend regular aerobic exercise Recommend home blood pressure monitoring to discuss at next visit ARB added for elevated urine microalbumin in type-2 diabetes  Hyperlipidemia - Goal on Track (progressing): YES.: Uncontrolled. LDL above goal of <70 due to very high risk given diabetes + at least 1 additional major risk factor (diabetes, hypertension, and chronic  kidney disease (CKD)) per 2020 AACE/ACE guidelines. Triglycerides at goal of <150 per 2020 AACE/ACE guidelines. Current medications: rosuvastatin 40 mg by mouth once daily Intolerances: none Taking medications as directed: yes Side effects thought to be attributed to current medication regimen: no Continue rosuvastatin 40 mg by mouth once daily Encourage dietary reduction of high fat containing foods such as butter, nuts, bacon, egg yolks, etc. Recommend regular aerobic exercise Re-check lipid panel in 4-12 weeks Consider addition of ezetimibe 10 mg by mouth daily  Chronic Kidney Disease Stage 3a - GFR 45-59 (Mild to Moderately Reduced Function) - Goal on Track (progressing): YES.: Follows with Dr. Marval Regal. Plans to see him next in Jan/Feb 2023 Appropriately managed Current medications: valsartan 40 mg by mouth once daily, dapagliflozin (Farxiga) 5 mg by mouth once daily, and rosuvastatin 40 mg by mouth once daily ARB recently added. Repeat BMP with stable/improved renal function. Most recent GFR: 64 mL/min Most recent microalbumin/creatinine ratio: 105 Encouraged healthy diet including more fruits and vegetables Recommend adequate hydration  Recommend regular aerobic exercise Continue ARB + statin. Will plan to discontinue Wilder Glade due to adverse events. Plan to add Rybelsus.  Patient Goals/Self-Care Activities Patient will:  Take medications as prescribed Check blood pressure at least once daily, document, and provide at future appointments Collaborate with provider on medication access solutions Engage in dietary modifications by less frequent dining out and decreased fat intake  Follow Up Plan: Telephone follow up appointment with care management team member scheduled for: 02/07/21      Medication Assistance:  Rybelsus through [place] still pending  Patient's preferred pharmacy is:  [id number] - [place], [address] [place] [address] Phone: [phone] Fax: [phone]  Follow Up:  Patient agrees to Care Plan and Follow-up.  Plan: Telephone follow up appointment with care management team member scheduled for:  02/07/21  Kennon Holter, PharmD Clinical Pharmacist [place] [phone]

## 2021-01-14 NOTE — Patient Instructions (Signed)
[name],  It was great to talk to you today!  Please call me with any questions or concerns.   Visit Information  Thank you for taking time to visit with me today. Please don't hesitate to contact me if I can be of assistance to you before our next scheduled telephone appointment.  Telephone follow up appointment with care management team member scheduled for: 02/07/21  If you need to cancel or re-schedule our visit, please call [phone] and our care guide team will be happy to assist you.  Following is a list of the goals we discussed today:  Patient Goals/Self-Care Activities Patient will:  Take medications as prescribed Check blood pressure at least once daily, document, and provide at future appointments Collaborate with provider on medication access solutions Engage in dietary modifications by less frequent dining out and decreased fat intake  Patient verbalizes understanding of instructions provided today and agrees to view in [place].   Kennon Holter, PharmD Clinical Pharmacist [place] [phone]  Rybelsus (semaglutide)    Important Administration Instructions Take Rybelsus at least 30 minutes before the first food, beverage, or other oral medications of the day with no more than 4 ounces of plain water only. If you wait less than 30 minutes, or take Rybelsus with food, beverages (other than plain water) or other oral medications, this will decrease the effectiveness of Rybelsus by decreasing its absorption. Swallow tablets whole. Do not split, crush, or chew tablets.   Dosing Start Rybelsus with 3 mg by mouth once daily for 30 days. The 3 mg dose is intended for treatment initiation and is not effective for blood sugar control. After 30 days on the 3 mg dose, increase the dose to 7 mg by mouth once daily. Dose may be increased to 14 mg once daily if additional blood sugar control is needed after at least 30 days on the 7 mg  dose. Taking two 7 mg Rybelsus tablets to achieve a 14 mg dose is not recommended. If a dose is missed, the missed dose should be skipped, and the next dose should be taken the following day.

## 2021-01-17 ENCOUNTER — Ambulatory Visit (INDEPENDENT_AMBULATORY_CARE_PROVIDER_SITE_OTHER): Payer: Medicare Other | Admitting: Family Medicine

## 2021-01-17 ENCOUNTER — Other Ambulatory Visit: Payer: Self-pay

## 2021-01-17 VITALS — BP 132/62 | HR 71 | Ht 59.0 in | Wt 203.6 lb

## 2021-01-17 DIAGNOSIS — E785 Hyperlipidemia, unspecified: Secondary | ICD-10-CM

## 2021-01-17 DIAGNOSIS — E119 Type 2 diabetes mellitus without complications: Secondary | ICD-10-CM | POA: Diagnosis not present

## 2021-01-17 DIAGNOSIS — I10 Essential (primary) hypertension: Secondary | ICD-10-CM

## 2021-01-17 DIAGNOSIS — E1169 Type 2 diabetes mellitus with other specified complication: Secondary | ICD-10-CM | POA: Diagnosis not present

## 2021-01-17 DIAGNOSIS — G4700 Insomnia, unspecified: Secondary | ICD-10-CM

## 2021-01-17 MED ORDER — VALSARTAN 40 MG PO TABS: 40.0000 mg | ORAL_TABLET | Freq: Every day | ORAL | 1 refills | Status: DC

## 2021-01-17 MED ORDER — AMLODIPINE BESYLATE 5 MG PO TABS: 5.0000 mg | ORAL_TABLET | Freq: Every day | ORAL | 0 refills | Status: DC

## 2021-01-17 MED ORDER — ROSUVASTATIN CALCIUM 40 MG PO TABS: 40.0000 mg | ORAL_TABLET | Freq: Every day | ORAL | 1 refills | Status: DC

## 2021-01-17 NOTE — Progress Notes (Signed)
   Subjective:    Patient ID: [name], female    DOB: 10-Apr-1950, 70 y.o.   MRN: [id number]  Diabetes She presents for her follow-up diabetic visit. Risk factors for coronary artery disease include diabetes mellitus, dyslipidemia, hypertension and post-menopausal. Current diabetic treatment includes oral agent (monotherapy).  Discuss recent labs  Patient states she ran out of Farixiga Friday or Saturday and the pharmacist is trying to qualify her for rybelsus   Review of Systems     Objective:   Physical Exam  General-in no acute distress Eyes-no discharge Lungs-respiratory rate normal, CTA CV-no murmurs,RRR Extremities skin warm dry no edema Neuro grossly normal Behavior normal, alert Diabetic foot exam normal      Assessment & Plan:  1. Insomnia, unspecified type Sleep problems, sleep information given, avoid medications.  Patient has typical waking up in the middle night difficult time getting back to sleep  2. Essential hypertension, benign Blood pressure good control continue current measures watch diet - valsartan (DIOVAN) 40 MG tablet; Take 1 tablet (40 mg total) by mouth daily.  Dispense: 90 tablet; Refill: 1 - Basic metabolic panel  3. Hyperlipidemia associated with type 2 diabetes mellitus ([place]) Continue current medication check labs - Lipid panel  4. Type 2 diabetes mellitus without complication, without long-term current use of insulin (HCC) Diabetes A1c under decent control currently running out of Wilder Glade it would be fine for her to stop this since it caused her to urinate too often instead she will use Rybelsus and will be coming in soon under a patient assistance program - Hemoglobin G8U - Basic metabolic panel Patient will do a follow-up in early March and do her labs right before that she will notify us if having any problems in we will follow-up closely regarding her Rybelsus after the first month of 3 mg will boosted up to 7 mg

## 2021-01-26 DIAGNOSIS — E785 Hyperlipidemia, unspecified: Secondary | ICD-10-CM

## 2021-01-26 DIAGNOSIS — I10 Essential (primary) hypertension: Secondary | ICD-10-CM

## 2021-01-26 DIAGNOSIS — E1169 Type 2 diabetes mellitus with other specified complication: Secondary | ICD-10-CM

## 2021-01-26 DIAGNOSIS — E119 Type 2 diabetes mellitus without complications: Secondary | ICD-10-CM

## 2021-02-07 ENCOUNTER — Ambulatory Visit (INDEPENDENT_AMBULATORY_CARE_PROVIDER_SITE_OTHER): Payer: Medicare Other | Admitting: Pharmacist

## 2021-02-07 DIAGNOSIS — E785 Hyperlipidemia, unspecified: Secondary | ICD-10-CM

## 2021-02-07 DIAGNOSIS — E119 Type 2 diabetes mellitus without complications: Secondary | ICD-10-CM

## 2021-02-07 DIAGNOSIS — E1169 Type 2 diabetes mellitus with other specified complication: Secondary | ICD-10-CM

## 2021-02-07 DIAGNOSIS — R809 Proteinuria, unspecified: Secondary | ICD-10-CM

## 2021-02-07 DIAGNOSIS — I10 Essential (primary) hypertension: Secondary | ICD-10-CM

## 2021-02-07 NOTE — Patient Instructions (Addendum)
[name],  It was great to talk to you today!  Please call me with any questions or concerns.   Visit Information  Following are the goals we discussed today:  Patient Goals/Self-Care Activities Patient will:  Take medications as prescribed Check blood pressure at least once daily, document, and provide at future appointments Collaborate with provider on medication access solutions Engage in dietary modifications by less frequent dining out and decreased fat intake  Plan: Telephone follow up appointment with care management team member scheduled for:  03/07/21  Kennon Holter, PharmD, BCACP, CPP Clinical Pharmacist Practitioner [place] [phone]  Please call the care guide team at [phone] if you need to cancel or reschedule your appointment.   Patient verbalizes understanding of instructions provided today and agrees to view in [place].    Rybelsus (semaglutide)    Important Administration Instructions Take Rybelsus at least 30 minutes before the first food, beverage, or other oral medications of the day with no more than 4 ounces of plain water only. If you wait less than 30 minutes, or take Rybelsus with food, beverages (other than plain water) or other oral medications, this will decrease the effectiveness of Rybelsus by decreasing its absorption. Swallow tablets whole. Do not split, crush, or chew tablets.   Dosing Start Rybelsus with 3 mg by mouth once daily for 30 days. The 3 mg dose is intended for treatment initiation and is not effective for blood sugar control. After 30 days on the 3 mg dose, increase the dose to 7 mg by mouth once daily. Dose may be increased to 14 mg once daily if additional blood sugar control is needed after at least 30 days on the 7 mg dose. Taking two 7 mg Rybelsus tablets to achieve a 14 mg dose is not recommended. If a dose is missed, the missed dose should be skipped, and the next dose should be taken the  following day.

## 2021-02-07 NOTE — Chronic Care Management (AMB) (Signed)
Chronic Care Management Pharmacy Note  02/07/2021 Name:  [name] MRN:  [id number] DOB:  [birth year]  Summary:  Type 2 Diabetes Controlled; Most recent A1c at goal of <7% per ADA guidelines Current medications:  none. Per recent discussion with PCP, Wilder Glade discontinued Current glucose readings:  fasting: low 100s which are at goal of 80-130 per ADA guidelines; pre/post-prandial (afternoon) at goal of <180 per ADA guidelines Patient assistance application for Rybelsus still pending. Application has been faxed 3 times now. Spent ~70 minutes on hold with [employer] to try to correct "missing information" on the application. Will likely not receive medication for another few weeks. Continue with diet management alone for now. Our office will contact the patient to pick up medication once it arrives. Will put administration instructions for patient in AVS.   Hyperlipidemia Consider addition of ezetimibe 10 mg by mouth daily  Chronic Kidney Disease Follows with Dr. Marval Regal. Plans to see him next in Jan/Feb 2023 Continue ARB + statin Plan to add Rybelsus soon. Patient assistance application pending.   Subjective: [name] is an 70 y.o. year old female who is a primary patient of Luking, Elayne Snare, MD.  The CCM team was consulted for assistance with disease management and care coordination needs.    Engaged with patient by telephone for follow up visit in response to provider referral for pharmacy case management and/or care coordination services.   Consent to Services:  The patient was given information about Chronic Care Management services, agreed to services, and gave verbal consent prior to initiation of services.  Please see initial visit note for detailed documentation.   Patient Care Team: Kathyrn Drown, MD as PCP - General (Family Medicine) Gala Romney Cristopher Estimable, MD as Consulting Physician (Gastroenterology) Kassie Mends, RN as [facility] Beryle Lathe, [facility] (Pharmacist)  Objective:  Lab Results  Component Value Date   CREATININE 0.95 01/12/2021   CREATININE 1.10 (H) 11/25/2020   CREATININE 1.02 (H) 10/30/2020    Lab Results  Component Value Date   HGBA1C 6.4 (H) 11/25/2020   Last diabetic Eye exam:  Lab Results  Component Value Date/Time   HMDIABEYEEXA No Retinopathy 06/08/2016 12:00 AM    Last diabetic Foot exam: No results found for: HMDIABFOOTEX      Component Value Date/Time   CHOL 143 11/25/2020 0913   TRIG 77 11/25/2020 0913   HDL 48 11/25/2020 0913   CHOLHDL 3.0 11/25/2020 0913   CHOLHDL 5.6 05/28/2014 1018   VLDL 16 05/28/2014 1018   LDLCALC 80 11/25/2020 0913    Hepatic Function Latest Ref Rng & Units 11/25/2020 10/30/2020 06/01/2020  Total Protein 6.0 - 8.5 g/dL 7.3 8.1 7.5  Albumin 3.7 - 4.7 g/dL 4.3 4.4 4.3  AST 0 - 40 IU/L _0 ALT 0 - 32 IU/L _1 Alk Phosphatase 44 - 121 IU/L 127(H) 117 117  Total Bilirubin 0.0 - 1.2 mg/dL 0.5 0.5 0.4  Bilirubin, Direct 0.00 - 0.40 mg/dL 0.15 - -    No results found for: TSH, FREET4  CBC Latest Ref Rng & Units 10/30/2020 04/25/2016 04/25/2016  WBC 4.0 - 10.5 K/uL 6.4 - 5.9  Hemoglobin 12.0 - 15.0 g/dL 14.2 14.3 13.7  Hematocrit 36.0 - 46.0 % 44.5 42.0 40.6  Platelets 150 - 400 K/uL 283 - 277    No results found for: VD25OH  Clinical ASCVD: No  The 10-year ASCVD risk score (Arnett DK, et  al., 2019) is: 21.7%   Values used to calculate the score:     Age: 70 years     Sex: Female     Is Non-Hispanic African American: Yes     Diabetic: Yes     Tobacco smoker: No     Systolic Blood Pressure: 660 mmHg     Is BP treated: Yes     HDL Cholesterol: 48 mg/dL     Total Cholesterol: 143 mg/dL    Social History   Tobacco Use  Smoking Status Former   Types: Cigarettes   Quit date: 06/17/1992   Years since quitting: 28.6  Smokeless Tobacco Never  Tobacco Comments   less than a pack a week   BP Readings from Last 3  Encounters:  01/17/21 132/62  10/30/20 (!) 142/72  06/01/20 124/70   Pulse Readings from Last 3 Encounters:  01/17/21 71  10/30/20 65  02/13/20 77   Wt Readings from Last 3 Encounters:  01/17/21 203 lb 9.6 oz (92.4 kg)  12/28/20 198 lb (89.8 kg)  06/01/20 198 lb 12.8 oz (90.2 kg)    Assessment: Review of patient past medical history, allergies, medications, health status, including review of consultants reports, laboratory and other test data, was performed as part of comprehensive evaluation and provision of chronic care management services.   SDOH:  (Social Determinants of Health) assessments and interventions performed:    CCM Care Plan  Allergies  Allergen Reactions   Farxiga [Dapagliflozin] Other (See Comments)    Frequent urination and excessive thirst    Medications Reviewed Today     Reviewed by Beryle Lathe, [facility] (Pharmacist) on 02/07/21 at 1405  Med List Status: <None>   Medication Order Taking? Sig Documenting Provider Last Dose Status Informant  acetaminophen (TYLENOL) 500 MG tablet [id number] Yes Take 1,000 mg by mouth every 6 (six) hours as needed for moderate pain or headache. [provider] Taking Active Self  amLODipine (NORVASC) 5 MG tablet [id number] Yes Take 1 tablet (5 mg total) by mouth daily. Kathyrn Drown, MD Taking Active   blood glucose meter kit and supplies [id number]  Dispense based on patient and insurance preference. Use to check blood sugars once daily . (FOR ICD-10 E10.9, E11.9). Kathyrn Drown, MD  Active   meclizine (ANTIVERT) 25 MG tablet [id number] Yes Take 1 tablet (25 mg total) by mouth 3 (three) times daily as needed for dizziness. Fredia Sorrow, MD Taking Active   Polyethyl Glycol-Propyl Glycol [facility] OP) [id number] Yes Place 1 drop into both eyes daily as needed (dry eyes). [provider] Taking Active Self  rosuvastatin (CRESTOR) 40 MG tablet [id number] Yes Take 1 tablet (40 mg total) by mouth daily.  Kathyrn Drown, MD Taking Active   valsartan (DIOVAN) 40 MG tablet [id number] Yes Take 1 tablet (40 mg total) by mouth daily. Kathyrn Drown, MD Taking Active             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, without long-term current use of insulin ([place]) 12/05/2019   Hyperlipidemia associated with type 2 diabetes mellitus ([place]) 12/05/2019   Venous stasis 09/02/2017   Morbid obesity ([place]) 12/06/2015   Pedal edema 08/11/2013   Lumbar pain 08/11/2013   Essential hypertension, benign 05/30/2012   CERVICAL RADICULITIS 09/15/2009    Immunization History  Administered Date(s) Administered   Fluad Quad(high Dose 65+) 12/05/2019   Influenza,  High Dose Seasonal PF 01/11/2021   Influenza, Seasonal, Injecte, Preservative Fre 12/06/2015   Influenza,inj,Quad PF,6+ Mos 01/11/2015, 01/03/2017, 11/09/2017, 11/12/2018   Moderna SARS-COV2 Booster Vaccination 02/05/2020   Moderna Sars-Covid-2 Vaccination 04/05/2019, 05/06/2019, 01/11/2021   Pneumococcal Conjugate-13 10/05/2014   Pneumococcal Polysaccharide-23 12/06/2015    Conditions to be addressed/monitored: HTN, HLD, DMII, and CKD Stage 3a  Care Plan : Medication Management  Updates made by Beryle Lathe, [place] since 02/07/2021 12:00 AM     Problem: T2DM, HTN, HLD, CKD   Priority: High  Onset Date: 12/24/2020     Long-Range Goal: Disease Progression Prevention   Start Date: 12/24/2020  Expected End Date: 01/02/2021  Recent Progress: On track  Priority: High  Note:   Current Barriers:  Unable to independently afford treatment regimen Unable to achieve control of hyperlipidemia Suboptimal therapeutic regimen for diabetes  Pharmacist Clinical Goal(s):  Patient will Verbalize ability to afford treatment regimen Achieve control of hyperlipidemia as evidenced by improved LDL Adhere to plan to optimize therapeutic regimen for  diabetes as evidenced by report of adherence to recommended medication management changes through collaboration with PharmD and provider.   Interventions: 1:1 collaboration with Kathyrn Drown, MD regarding development and update of comprehensive plan of care as evidenced by provider attestation and co-signature Inter-disciplinary care team collaboration (see longitudinal plan of care) Comprehensive medication review performed; medication list updated in electronic medical record  Type 2 Diabetes - Goal on Track (progressing): YES.: Controlled; Most recent A1c at goal of <7% per ADA guidelines Current medications:  none. Per recent discussion with PCP, Wilder Glade discontinued Patient assistance application for Rybelsus pending approval Intolerances:  metformin (unknown) and [place] (frequent urination and excessive thirst) Taking medications as directed: n/a Side effects thought to be attributed to current medication regimen: n/a Hypoglycemia prevention: not indicated at this time Current meal patterns: not discussed today Current exercise: not discussed today On a statin: yes On aspirin 81 mg daily: no Last microalbumin/creatinine ratio: 105; on an ACEi/ARB: yes Last eye exam: overdue Last foot exam: completed within last year Pneumonia vaccine: series complete Influenza vaccine: up to date Current glucose readings:  fasting: low 100s which are at goal of 80-130 per ADA guidelines; pre/post-prandial (afternoon) at goal of <180 per ADA guidelines Patient identified as a good candidate for a GLP-1 receptor agonist given reduction in cardiovascular disease, slowed chronic kidney disease progression, low risk of hypoglycemia, increased satiety , and weight loss. Patient denies a personal or family history of medullary thyroid carcinoma (MTC) or Multiple Endocrine Neoplasia syndrome type 2 (MEN 2). Patient also denies any history of pancreatitis or biliary disease. Patient assistance application  for Rybelsus still pending. Will likely not receive medication for another few weeks. Continue with diet management alone for now. Our office will contact the patient to pick up medication once it arrives. Will put administration instructions for patient in AVS.   Hypertension - Condition stable. Not addressed this visit.: Blood pressure under good control. Blood pressure is at goal of <130/80 mmHg per 2017 AHA/ACC guidelines. Current medications: valsartan 40 mg by mouth once daily and amlodipine 5 mg by mouth once daily ARB recently added for elevated urine microalbumin in type-2 diabetes. Repeat BMP with stable/improved renal function. Intolerances: none Taking medications as directed: yes Side effects thought to be attributed to current medication regimen: no Current home blood pressure: not discussed today Continue valsartan 40 mg by mouth once daily and amlodipine 5 mg by mouth once daily Encourage dietary sodium  restriction/DASH diet Recommend regular aerobic exercise Recommend home blood pressure monitoring to discuss at next visit  Hyperlipidemia - Goal on Track (progressing): YES.: Uncontrolled. LDL above goal of <70 due to very high risk given diabetes + at least 1 additional major risk factor (diabetes, hypertension, and chronic kidney disease (CKD)) per 2020 AACE/ACE guidelines. Triglycerides at goal of <150 per 2020 AACE/ACE guidelines. Current medications: rosuvastatin 40 mg by mouth once daily Intolerances: none Taking medications as directed: yes Side effects thought to be attributed to current medication regimen: no Continue rosuvastatin 40 mg by mouth once daily Encourage dietary reduction of high fat containing foods such as butter, nuts, bacon, egg yolks, etc. Recommend regular aerobic exercise Re-check lipid panel in 4-12 weeks Consider addition of ezetimibe 10 mg by mouth daily  Chronic Kidney Disease Stage 3a - GFR 45-59 (Mild to Moderately Reduced Function) - Goal  on Track (progressing): YES.: Follows with Dr. Marval Regal. Plans to see him next in Jan/Feb 2023 Appropriately managed Current medications: valsartan 40 mg by mouth once daily and rosuvastatin 40 mg by mouth once daily ARB recently added for elevated urine microalbumin in type-2 diabetes. Repeat BMP with stable/improved renal function. Wilder Glade discontinued due to frequent urination and excessive thirst Most recent GFR: 64 mL/min Most recent microalbumin/creatinine ratio: 105 Encouraged healthy diet including more fruits and vegetables Recommend adequate hydration  Recommend regular aerobic exercise Continue ARB + statin Plan to add Rybelsus soon. Patient assistance application pending.   Patient Goals/Self-Care Activities Patient will:  Take medications as prescribed Check blood pressure at least once daily, document, and provide at future appointments Collaborate with provider on medication access solutions Engage in dietary modifications by less frequent dining out and decreased fat intake  Follow Up Plan: Telephone follow up appointment with care management team member scheduled for: 03/07/21      Medication Assistance:  Rybelsus PAP pending through [employer]  Patient's preferred pharmacy is:  [id number] [place] [address]Phone: [phone]: [phone] Follow Up:  Patient agrees to Care Plan and Follow-up.  Plan: Telephone follow up appointment with care management team member scheduled for:  03/07/21  CKennon Holter PharmD, BCACP, CPP Clinical Pharmacist Practitioner [phone]

## 2021-02-10 ENCOUNTER — Encounter: Payer: Self-pay | Admitting: Pharmacist

## 2021-02-23 ENCOUNTER — Ambulatory Visit: Payer: Medicare Other | Admitting: *Deleted

## 2021-02-23 DIAGNOSIS — I10 Essential (primary) hypertension: Secondary | ICD-10-CM

## 2021-02-23 DIAGNOSIS — E119 Type 2 diabetes mellitus without complications: Secondary | ICD-10-CM

## 2021-02-23 NOTE — Patient Instructions (Signed)
Visit Information  Thank you for taking time to visit with me today. Please don't hesitate to contact me if I can be of assistance to you before our next scheduled telephone appointment.  Following are the goals we discussed today:  Take medications as prescribed   Attend all scheduled provider appointments Perform all self care activities independently  Perform IADL's (shopping, preparing meals, housekeeping, managing finances) independently Call provider office for new concerns or questions  check blood sugar at prescribed times: twice daily enter blood sugar readings and medication or insulin into daily log take the blood sugar log to all doctor visits take the blood sugar meter to all doctor visits fill half of plate with vegetables prepare main meal at home 3 to 5 days each week read food labels for fat, fiber, carbohydrates and portion size check blood pressure 3 times per week write blood pressure results in a log or diary take blood pressure log to all doctor appointments call doctor for signs and symptoms of high blood pressure keep all doctor appointments eat more whole grains, fruits and vegetables, lean meats and healthy fats If you need assistance completing advanced directives please call RN care manager at [phone] Follow low sodium diet- read food labels- avoid fast food, salty snacks Practice good handwashing and wear a mask as needed  Our next appointment is by telephone on 04/20/2021 at 3 pm  Please call the care guide team at [phone] if you need to cancel or reschedule your appointment.   If you are experiencing a Mental Health or [place] or need someone to talk to, please call the [place] National Suicide Prevention Lifeline: (203) 210-4515 or TTY: 548-282-8986 TTY (929) 114-8958) to talk to a trained counselor call 1-800-273-TALK (toll free, 24 hour hotline) go to [facility] [address] [place]  [phone]) call the [place]: [phone] call 911   Patient verbalizes understanding of instructions provided today and agrees to view in [place].   Jacqlyn Larsen [facility], BSN RN Case Manager [facility] [phone]

## 2021-02-23 NOTE — Chronic Care Management (AMB) (Signed)
Chronic Care Management   CCM RN Visit Note  02/23/2021 Name: [name] MRN: [id number] DOB: 1950/11/08  Subjective: [name] is a 70 y.o. year old female who is a primary care patient of Luking, Elayne Snare, MD. The care management team was consulted for assistance with disease management and care coordination needs.    Engaged with patient by telephone for follow up visit in response to provider referral for case management and/or care coordination services.   Consent to Services:  The patient was given information about Chronic Care Management services, agreed to services, and gave verbal consent prior to initiation of services.  Please see initial visit note for detailed documentation.   Patient agreed to services and verbal consent obtained.   Assessment: Review of patient past medical history, allergies, medications, health status, including review of consultants reports, laboratory and other test data, was performed as part of comprehensive evaluation and provision of chronic care management services.   SDOH (Social Determinants of Health) assessments and interventions performed:    CCM Care Plan  Allergies  Allergen Reactions   Farxiga [Dapagliflozin] Other (See Comments)    Frequent urination and excessive thirst    Outpatient Encounter Medications as of 02/23/2021  Medication Sig   acetaminophen (TYLENOL) 500 MG tablet Take 1,000 mg by mouth every 6 (six) hours as needed for moderate pain or headache.   amLODipine (NORVASC) 5 MG tablet Take 1 tablet (5 mg total) by mouth daily.   blood glucose meter kit and supplies Dispense based on patient and insurance preference. Use to check blood sugars once daily . (FOR ICD-10 E10.9, E11.9).   meclizine (ANTIVERT) 25 MG tablet Take 1 tablet (25 mg total) by mouth 3 (three) times daily as needed for dizziness.   Polyethyl Glycol-Propyl Glycol (SYSTANE OP) Place 1 drop into both eyes daily as needed (dry eyes).    rosuvastatin (CRESTOR) 40 MG tablet Take 1 tablet (40 mg total) by mouth daily.   valsartan (DIOVAN) 40 MG tablet Take 1 tablet (40 mg total) by mouth daily.   No facility-administered encounter medications on file as of 02/23/2021.    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, without long-term current use of insulin ([place]) 12/05/2019   Hyperlipidemia associated with type 2 diabetes mellitus ([place]) 12/05/2019   Venous stasis 09/02/2017   Morbid obesity ([place]) 12/06/2015   Pedal edema 08/11/2013   Lumbar pain 08/11/2013   Essential hypertension, benign 05/30/2012   CERVICAL RADICULITIS 09/15/2009    Conditions to be addressed/monitored:HTN and DMII  Care Plan : RN Care Manager plan of care  Updates made by Kassie Mends, RN since 02/23/2021 12:00 AM     Problem: No plan of care established for management of chronic disease states (DM2, HTN)   Priority: High     Long-Range Goal: Development of plan of care for chronic disease management (DM2, HTN)   Start Date: 01/12/2021  Expected End Date: 07/11/2021  Priority: High  Note:   Current Barriers:  Knowledge Deficits related to plan of care for management of HTN and DMII  Chronic Disease Management support and education needs related to HTN and DMII Knowledge Deficits related to basic Diabetes pathophysiology and self care/management- pt reports she lives with her spouse and she is independent with all aspects of her care, pt reports she received advanced directives packet but has not completed yet. Knowledge Deficits related  to medications used for management of diabetes- pt reports she cannot afford farxiga at 142$/ month and is having unwanted side effects with excessive/ frequent urination that prevents her from sleeping and she has been instructed to discontinue. Pt reports she has completed paperwork for assistance with help for rybelsus  (with assistance of CCM pharmacist). Does not use cbg meter- pt reports has not been checking CBG recently due to the holidays and having company but plans to start back, reports the last CBG was 118 fasting. Does not adhere to provider recommendations- does not always eat carbohydrate modified/ low sodium diet Does not contact provider office for questions/concerns Knowledge Deficits related to basic understanding of hypertension pathophysiology and self care management- pt reports she does not check blood pressure often.  RNCM Clinical Goal(s):  Patient will verbalize understanding of plan for management of HTN and DMII as evidenced by patient report, review of EHR take all medications exactly as prescribed and will call provider for medication related questions as evidenced by patient report, review of EHR    attend all scheduled medical appointments: 04/27/2021 primary care provider  as evidenced by patient report, review of EHR and        through collaboration with RN Care manager, provider, and care team.   Interventions: 1:1 collaboration with primary care provider regarding development and update of comprehensive plan of care as evidenced by provider attestation and co-signature Inter-disciplinary care team collaboration (see longitudinal plan of care) Evaluation of current treatment plan related to  self management and patient's adherence to plan as established by provider  Hypertension Interventions: Last practice recorded BP readings:  BP Readings from Last 3 Encounters:  10/30/20 (!) 142/72  06/01/20 124/70  02/13/20 138/70  Most recent eGFR/CrCl:  Lab Results  Component Value Date   EGFR 54 (L) 11/25/2020    No components found for: CRCL  Evaluation of current treatment plan related to hypertension self management and patient's adherence to plan as established by provider Reviewed medications with patient and discussed importance of compliance Counseled on the importance of  exercise goals with target of 150 minutes per week Advised patient, providing education and rationale, to monitor blood pressure daily and record, calling PCP for findings outside established parameters Reviewed low sodium diet Reinforced with patient to check blood pressure at least 3 x per week Reviewed all upcoming scheduled appointments  Diabetes Interventions: Assessed patient's understanding of A1c goal: <7% Reviewed medications with patient and discussed importance of medication adherence Reviewed scheduled/upcoming provider appointments including:   04/27/21 primary care provider Review of patient status, including review of consultants reports, relevant laboratory and other test results, and medications completed Reviewed carbohydrate modified diet and food choices Reinforced signs/ symptoms hypo and hyperglycemia and actions to take Reinforced importance of checking blood sugar as prescribed  Lab Results  Component Value Date   HGBA1C 6.4 (H) 11/25/2020     Patient Goals/Self-Care Activities: Take medications as prescribed   Attend all scheduled provider appointments Perform all self care activities independently  Perform IADL's (shopping, preparing meals, housekeeping, managing finances) independently Call provider office for new concerns or questions  check blood sugar at prescribed times: twice daily enter blood sugar readings and medication or insulin into daily log take the blood sugar log to all doctor visits take the blood sugar meter to all doctor visits fill half of plate with vegetables prepare main meal at home 3 to 5 days each week read food labels for fat, fiber, carbohydrates and portion  size check blood pressure 3 times per week write blood pressure results in a log or diary take blood pressure log to all doctor appointments call doctor for signs and symptoms of high blood pressure keep all doctor appointments eat more whole grains, fruits and vegetables,  lean meats and healthy fats If you need assistance completing advanced directives please call RN care manager at [phone] Follow low sodium diet- read food labels- avoid fast food, salty snacks Practice good handwashing and wear a mask as needed  Follow Up Plan:  Telephone follow up appointment with care management team member scheduled for:  04/20/2021       Plan:Telephone follow up appointment with care management team member scheduled for:  04/20/2021  Jacqlyn Larsen [facility], BSN RN Case Manager [place] [phone]

## 2021-02-26 DIAGNOSIS — I10 Essential (primary) hypertension: Secondary | ICD-10-CM | POA: Diagnosis not present

## 2021-02-26 DIAGNOSIS — E119 Type 2 diabetes mellitus without complications: Secondary | ICD-10-CM | POA: Diagnosis not present

## 2021-02-26 DIAGNOSIS — E785 Hyperlipidemia, unspecified: Secondary | ICD-10-CM

## 2021-02-26 DIAGNOSIS — E1169 Type 2 diabetes mellitus with other specified complication: Secondary | ICD-10-CM

## 2021-03-07 ENCOUNTER — Ambulatory Visit (INDEPENDENT_AMBULATORY_CARE_PROVIDER_SITE_OTHER): Payer: Medicare Other | Admitting: Pharmacist

## 2021-03-07 DIAGNOSIS — E119 Type 2 diabetes mellitus without complications: Secondary | ICD-10-CM

## 2021-03-07 DIAGNOSIS — I10 Essential (primary) hypertension: Secondary | ICD-10-CM

## 2021-03-07 DIAGNOSIS — R809 Proteinuria, unspecified: Secondary | ICD-10-CM

## 2021-03-07 DIAGNOSIS — E785 Hyperlipidemia, unspecified: Secondary | ICD-10-CM

## 2021-03-07 DIAGNOSIS — E1169 Type 2 diabetes mellitus with other specified complication: Secondary | ICD-10-CM

## 2021-03-07 NOTE — Patient Instructions (Signed)
[name],  It was great to talk to you today!  Please call me with any questions or concerns.   Visit Information  Following are the goals we discussed today:  Patient Goals/Self-Care Activities Patient will:  Take medications as prescribed Check blood pressure at least once daily, document, and provide at future appointments Collaborate with provider on medication access solutions Engage in dietary modifications by less frequent dining out and decreased fat intake  Plan: Telephone follow up appointment with care management team member scheduled for:  04/18/21  Kennon Holter, PharmD, BCACP, CPP Clinical Pharmacist Practitioner [place] [phone]  Please call the care guide team at [phone] if you need to cancel or reschedule your appointment.   Patient verbalizes understanding of instructions provided today and agrees to view in [place].    Rybelsus (semaglutide)    Important Administration Instructions Take Rybelsus at least 30 minutes before the first food, beverage, or other oral medications of the day with no more than 4 ounces of plain water only. If you wait less than 30 minutes, or take Rybelsus with food, beverages (other than plain water) or other oral medications, this will decrease the effectiveness of Rybelsus by decreasing its absorption. Swallow tablets whole. Do not split, crush, or chew tablets.   Dosing Start Rybelsus with 3 mg by mouth once daily for 30 days. The 3 mg dose is intended for treatment initiation and is not effective for blood sugar control. After 30 days on the 3 mg dose, increase the dose to 7 mg by mouth once daily. Dose may be increased to 14 mg once daily if additional blood sugar control is needed after at least 30 days on the 7 mg dose. Taking two 7 mg Rybelsus tablets to achieve a 14 mg dose is not recommended. If a dose is missed, the missed dose should be skipped, and the next dose should be taken  the following day.

## 2021-03-07 NOTE — Chronic Care Management (AMB) (Signed)
Chronic Care Management Pharmacy Note  03/07/2021 Name:  [name] MRN:  [id number] DOB:  May 10, 1950  Summary: Type 2 Diabetes Patient has been approved for Rybelsus through [employer] patient assistance program through 01/16/22. They are in the process of shipping 4 month supply of medication to our office. Patient will be notified when we receive.  Current glucose readings:  fasting: 115-137 which is slightly higher than before but most are at goal of 80-130 per ADA guidelines; pre/post-prandial (afternoon) at goal of <180 per ADA guidelines Plan to add semaglutide (Rybelsus) 3 mg by mouth at least 30 minutes before the first food, beverage, or other oral medications of the day with no more than 4 ounces of plain water only for 30 days then increase to 31m by mouth daily  Hyperlipidemia  Uncontrolled. LDL above goal of <70 due to very high risk given diabetes + at least 1 additional major risk factor (diabetes, hypertension, and chronic kidney disease (CKD)) per 2020 AACE/ACE guidelines. Triglycerides at goal of <150 per 2020 AACE/ACE guidelines. Continue rosuvastatin 40 mg by mouth once daily Consider addition of ezetimibe 10 mg by mouth daily. Will discuss at future visits  Chronic Kidney Disease Stage 3a - GFR 45-59 (Mild to Moderately Reduced Function) Continue ARB + statin Plan to add Rybelsus as above  Subjective: Emily KASLERis an 71y.o. year old female who is a primary patient of Luking, SElayne Snare MD.  The CCM team was consulted for assistance with disease management and care coordination needs.    Engaged with patient by telephone for follow up visit in response to provider referral for pharmacy case management and/or care coordination services.   Consent to Services:  The patient was given information about Chronic Care Management services, agreed to services, and gave verbal consent prior to initiation of services.  Please see initial visit note for detailed  documentation.   Patient Care Team: LKathyrn Drown MD as PCP - General (Family Medicine) RGala RomneyRCristopher Estimable MD as Consulting Physician (Gastroenterology) FKassie Mends RN as [facility](Pharmacist)  Objective:  Lab Results  Component Value Date   CREATININE 0.95 01/12/2021   CREATININE 1.10 (H) 11/25/2020   CREATININE 1.02 (H) 10/30/2020    Lab Results  Component Value Date   HGBA1C 6.4 (H) 11/25/2020   Last diabetic Eye exam:  Lab Results  Component Value Date/Time   HMDIABEYEEXA No Retinopathy 06/08/2016 12:00 AM    Last diabetic Foot exam: No results found for: HMDIABFOOTEX      Component Value Date/Time   CHOL 143 11/25/2020 0913   TRIG 77 11/25/2020 0913   HDL 48 11/25/2020 0913   CHOLHDL 3.0 11/25/2020 0913   CHOLHDL 5.6 05/28/2014 1018   VLDL 16 05/28/2014 1018   LDLCALC 80 11/25/2020 0913    Hepatic Function Latest Ref Rng & Units 11/25/2020 10/30/2020 06/01/2020  Total Protein 6.0 - 8.5 g/dL 7.3 8.1 7.5  Albumin 3.7 - 4.7 g/dL 4.3 4.4 4.3  AST 0 - 40 IU/L _0 ALT 0 - 32 IU/L _1 Alk Phosphatase 44 - 121 IU/L 127(H) 117 117  Total Bilirubin 0.0 - 1.2 mg/dL 0.5 0.5 0.4  Bilirubin, Direct 0.00 - 0.40 mg/dL 0.15 - -    No results found for: TSH, FREET4  CBC Latest Ref Rng & Units 10/30/2020 04/25/2016 04/25/2016  WBC 4.0 - 10.5 K/uL 6.4 - 5.9  Hemoglobin 12.0 -  15.0 g/dL 14.2 14.3 13.7  Hematocrit 36.0 - 46.0 % 44.5 42.0 40.6  Platelets 150 - 400 K/uL 283 - 277    No results found for: VD25OH  Clinical ASCVD: No  The 10-year ASCVD risk score (Arnett DK, et al., 2019) is: 21.7%   Values used to calculate the score:     Age: 86 years     Sex: Female     Is Non-Hispanic African American: Yes     Diabetic: Yes     Tobacco smoker: No     Systolic Blood Pressure: 734 mmHg     Is BP treated: Yes     HDL Cholesterol: 48 mg/dL     Total Cholesterol: 143 mg/dL    Social History    Tobacco Use  Smoking Status Former   Types: Cigarettes   Quit date: 06/17/1992   Years since quitting: 28.7  Smokeless Tobacco Never  Tobacco Comments   less than a pack a week   BP Readings from Last 3 Encounters:  01/17/21 132/62  10/30/20 (!) 142/72  06/01/20 124/70   Pulse Readings from Last 3 Encounters:  01/17/21 71  10/30/20 65  02/13/20 77   Wt Readings from Last 3 Encounters:  01/17/21 203 lb 9.6 oz (92.4 kg)  12/28/20 198 lb (89.8 kg)  06/01/20 198 lb 12.8 oz (90.2 kg)    Assessment: Review of patient past medical history, allergies, medications, health status, including review of consultants reports, laboratory and other test data, was performed as part of comprehensive evaluation and provision of chronic care management services.   SDOH:  (Social Determinants of Health) assessments and interventions performed:    CCM Care Plan  Allergies  Allergen Reactions   Farxiga [Dapagliflozin] Other (See Comments)    Frequent urination and excessive thirst    Medications Reviewed Today     Reviewed by Beryle Lathe, [facility] (Pharmacist) on 03/07/21 at 1425  Med List Status: <None>   Medication Order Taking? Sig Documenting Provider Last Dose Status Informant  acetaminophen (TYLENOL) 500 MG tablet [id number] Yes Take 1,000 mg by mouth every 6 (six) hours as needed for moderate pain or headache. [provider] Taking Active Self  amLODipine (NORVASC) 5 MG tablet [id number] Yes Take 1 tablet (5 mg total) by mouth daily. Kathyrn Drown, MD Taking Active   blood glucose meter kit and supplies [id number]  Dispense based on patient and insurance preference. Use to check blood sugars once daily . (FOR ICD-10 E10.9, E11.9). Kathyrn Drown, MD  Active   meclizine (ANTIVERT) 25 MG tablet [id number] Yes Take 1 tablet (25 mg total) by mouth 3 (three) times daily as needed for dizziness. Fredia Sorrow, MD Taking Active   Polyethyl Glycol-Propyl Glycol [facility])  [id number] Yes Place 1 drop into both eyes daily as needed (dry eyes). [provider] Taking Active Self  rosuvastatin (CRESTOR) 40 MG tablet [id number] Yes Take 1 tablet (40 mg total) by mouth daily. Kathyrn Drown, MD Taking Active   Semaglutide (RYBELSUS) 3 MG TABS [id number] No Take 1 tablet by mouth daily before breakfast.  Patient not taking: Reported on 03/07/2021   [provider] Not Taking Active Self           Med Note Rhea Belton Mar 07, 2021  2:24 PM) Patient receives from [facility] patient assistance program   Semaglutide (RYBELSUS) 7 MG TABS [id number] No Take 1 tablet by mouth daily before breakfast.  Patient not taking:  Reported on 03/07/2021   [provider] Not Taking Active Self           Med Note Rhea Belton Mar 07, 2021  2:25 PM) Patient receives from [facility] patient assistance program    valsartan (DIOVAN) 40 MG tablet [id number] Yes Take 1 tablet (40 mg total) by mouth daily. Kathyrn Drown, MD Taking Active             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, without long-term current use of insulin ([place]) 12/05/2019   Hyperlipidemia associated with type 2 diabetes mellitus ([place]) 12/05/2019   Venous stasis 09/02/2017   Morbid obesity ([place]) 12/06/2015   Pedal edema 08/11/2013   Lumbar pain 08/11/2013   Essential hypertension, benign 05/30/2012   CERVICAL RADICULITIS 09/15/2009    Immunization History  Administered Date(s) Administered   Fluad Quad(high Dose 65+) 12/05/2019   Influenza, High Dose Seasonal PF 01/11/2021   Influenza, Seasonal, Injecte, Preservative Fre 12/06/2015   Influenza,inj,Quad PF,6+ Mos 01/11/2015, 01/03/2017, 11/09/2017, 11/12/2018   Moderna SARS-COV2 Booster Vaccination 02/05/2020   Moderna Sars-Covid-2 Vaccination 04/05/2019, 05/06/2019, 01/11/2021   Pneumococcal Conjugate-13  10/05/2014   Pneumococcal Polysaccharide-23 12/06/2015    Conditions to be addressed/monitored: HTN, HLD, DMII, and CKD Stage 3a  Care Plan : Medication Management  Updates made by Beryle Lathe, [place] since 03/07/2021 12:00 AM     Problem: T2DM, HTN, HLD, CKD   Priority: High  Onset Date: 12/24/2020     Long-Range Goal: Disease Progression Prevention   Start Date: 12/24/2020  Expected End Date: 01/02/2021  Recent Progress: On track  Priority: High  Note:   Current Barriers:  Unable to independently afford treatment regimen Unable to achieve control of hyperlipidemia Suboptimal therapeutic regimen for diabetes  Pharmacist Clinical Goal(s):  Patient will Verbalize ability to afford treatment regimen Achieve control of hyperlipidemia as evidenced by improved LDL Adhere to plan to optimize therapeutic regimen for diabetes as evidenced by report of adherence to recommended medication management changes through collaboration with PharmD and provider.   Interventions: 1:1 collaboration with Kathyrn Drown, MD regarding development and update of comprehensive plan of care as evidenced by provider attestation and co-signature Inter-disciplinary care team collaboration (see longitudinal plan of care) Comprehensive medication review performed; medication list updated in electronic medical record  Type 2 Diabetes - Goal on Track (progressing): YES.: Controlled; Most recent A1c at goal of <7% per ADA guidelines Current medications:  none. Per recent discussion with PCP, Farxiga discontinued Patient has been approved for Rybelsus through [employer] patient assistance program through 01/16/22. They are in the process of shipping 4 month supply of medication to our office. Patient will be notified when we receive.  Intolerances:  metformin (unknown) and Farxiga (frequent urination and excessive thirst) Taking medications as directed: n/a Side effects thought to be attributed to  current medication regimen: n/a Hypoglycemia prevention: not indicated at this time Current meal patterns: not discussed today Current exercise: not discussed today On a statin: yes On aspirin 81 mg daily: no Last microalbumin/creatinine ratio: 105; on an ACEi/ARB: yes Last eye exam: overdue Last foot exam: completed within last year Pneumonia vaccine: series complete Influenza vaccine: up to date Current glucose readings:  fasting: 115-137 which is slightly higher than before but most are at goal of 80-130 per ADA guidelines; pre/post-prandial (afternoon) at goal of <180 per ADA guidelines Plan to  add semaglutide (Rybelsus) 3 mg by mouth at least 30 minutes before the first food, beverage, or other oral medications of the day with no more than 4 ounces of plain water only for 30 days then increase to 47m by mouth daily  Hypertension - Condition stable. Not addressed this visit.: Blood pressure under good control. Blood pressure is at goal of <130/80 mmHg per 2017 AHA/ACC guidelines. Current medications: valsartan 40 mg by mouth once daily and amlodipine 5 mg by mouth once daily Intolerances: none Taking medications as directed: yes Side effects thought to be attributed to current medication regimen: no Current home blood pressure: not discussed today Continue valsartan 40 mg by mouth once daily and amlodipine 5 mg by mouth once daily Encourage dietary sodium restriction/DASH diet Recommend regular aerobic exercise Recommend home blood pressure monitoring to discuss at next visit  Hyperlipidemia - Condition stable. Not addressed this visit.: Uncontrolled. LDL above goal of <70 due to very high risk given diabetes + at least 1 additional major risk factor (diabetes, hypertension, and chronic kidney disease (CKD)) per 2020 AACE/ACE guidelines. Triglycerides at goal of <150 per 2020 AACE/ACE guidelines. Current medications: rosuvastatin 40 mg by mouth once daily Intolerances: none Taking  medications as directed: yes Side effects thought to be attributed to current medication regimen: no Continue rosuvastatin 40 mg by mouth once daily Encourage dietary reduction of high fat containing foods such as butter, nuts, bacon, egg yolks, etc. Recommend regular aerobic exercise Re-check lipid panel in 4-12 weeks Consider addition of ezetimibe 10 mg by mouth daily. Will discuss at future visits  Chronic Kidney Disease Stage 3a - GFR 45-59 (Mild to Moderately Reduced Function) - Goal on Track (progressing): YES.: Follows with Dr. CMarval Regal Plans to see him next in Jan/Feb 2023 Appropriately managed Current medications: valsartan 40 mg by mouth once daily and rosuvastatin 40 mg by mouth once daily ARB recently added for elevated urine microalbumin in type-2 diabetes. Repeat BMP with stable/improved renal function. FWilder Gladediscontinued due to frequent urination and excessive thirst Most recent GFR: 64 mL/min Most recent microalbumin/creatinine ratio: 105 Encouraged healthy diet including more fruits and vegetables Recommend adequate hydration  Recommend regular aerobic exercise Continue ARB + statin Plan to add Rybelsus as above  Patient Goals/Self-Care Activities Patient will:  Take medications as prescribed Check blood pressure at least once daily, document, and provide at future appointments Collaborate with provider on medication access solutions Engage in dietary modifications by less frequent dining out and decreased fat intake  Follow Up Plan: Telephone follow up appointment with care management team member scheduled for: 04/18/21       Medication Assistance:  Rybelsus obtained through [employer] medication assistance program.  Enrollment ends 01/26/22  Patient's preferred pharmacy is:  [id number] [place], [id number]: [phone]: [phone] Up:  Patient agrees to  Care Plan and Follow-up.  Plan: Telephone follow up appointment with care management team member scheduled for:  04/18/21  ChKennon HolterPharmD, BCACP, CPP Clinical Pharmacist Practitioner [phone]

## 2021-03-28 ENCOUNTER — Other Ambulatory Visit: Payer: Self-pay | Admitting: Family Medicine

## 2021-03-28 ENCOUNTER — Ambulatory Visit: Payer: Medicare Other | Admitting: Pharmacist

## 2021-03-28 DIAGNOSIS — I10 Essential (primary) hypertension: Secondary | ICD-10-CM

## 2021-03-28 DIAGNOSIS — R809 Proteinuria, unspecified: Secondary | ICD-10-CM

## 2021-03-28 DIAGNOSIS — E119 Type 2 diabetes mellitus without complications: Secondary | ICD-10-CM

## 2021-03-28 DIAGNOSIS — E1169 Type 2 diabetes mellitus with other specified complication: Secondary | ICD-10-CM

## 2021-03-28 DIAGNOSIS — E785 Hyperlipidemia, unspecified: Secondary | ICD-10-CM

## 2021-03-28 MED ORDER — IRBESARTAN 75 MG PO TABS: 75.0000 mg | ORAL_TABLET | Freq: Every day | ORAL | 3 refills | Status: DC

## 2021-03-28 NOTE — Patient Instructions (Signed)
[name],  It was great to talk to you today!  Stop taking valsartan. Start taking irbesartan 75 mg by mouth daily instead. No other medication changes today.  Please call me with any questions or concerns.  Visit Information  Following are the goals we discussed today:   Goals Addressed             This Visit's Progress    Medication Management       Patient Goals/Self-Care Activities Patient will:  Take medications as prescribed Check blood pressure at least once daily, document, and provide at future appointments Collaborate with provider on medication access solutions Engage in dietary modifications by less frequent dining out and decreased fat intake          Follow-up plan: Telephone follow up appointment with care management team member scheduled for:  04/18/21  Patient verbalizes understanding of instructions and care plan provided today and agrees to view in [place]. Active MyChart status confirmed with patient.    Please call the care guide team at [phone] if you need to cancel or reschedule your appointment.   Kennon Holter, PharmD, Para [place], CPP Clinical Pharmacist Practitioner [place] [phone]

## 2021-03-28 NOTE — Chronic Care Management (AMB) (Signed)
Chronic Care Management Pharmacy Note  03/28/2021 Name:  [name] MRN:  [id number] DOB:  Jun 24, 1950  Summary: Type 2 Diabetes Patient has been approved for Rybelsus through [employer] patient assistance program through 01/16/22. They are still in the process of shipping 4 month supply of medication to our office. Patient will be notified when we receive.  Current glucose readings:  fasting: most are at goal of 80-130 per ADA guidelines; pre/post-prandial (afternoon) at goal of <180 per ADA guidelines Plan to add semaglutide (Rybelsus) 3 mg by mouth at least 30 minutes before the first food, beverage, or other oral medications of the day with no more than 4 ounces of plain water only for 30 days then increase to $RemoveBef'7mg'QulWZNDLJO$  by mouth daily  Hypertension/Chronic kidney disease Patient reports valsartan is no longer preferred on her insurance formulary and copay is $120 for 90 day supply. Instructed patient to contact insurance to determine preferred angiotensin receptor blocker. Patient reports she spoke with them this morning and they informed her that irbesartan is preferred and copay should be $6.  Continue amlodipine 5 mg by mouth once daily Add irbesartan 75 mg by mouth once daily Discontinue valsartan 40 mg by mouth once daily  Hyperlipidemia Continue rosuvastatin 40 mg by mouth once daily Re-check lipid panel a few days before next primary care provider visit If LDL remains above goal, will consider addition of ezetimibe 10 mg by mouth daily  Subjective: [name] is an 71 y.o. year old female who is a primary patient of Luking, Elayne Snare, MD.  The CCM team was consulted for assistance with disease management and care coordination needs.    Engaged with patient by telephone for follow up visit in response to provider referral for pharmacy case management and/or care coordination services.   Consent to Services:  The patient was given information about Chronic Care Management  services, agreed to services, and gave verbal consent prior to initiation of services.  Please see initial visit note for detailed documentation.   Patient Care Team: Kathyrn Drown, MD as PCP - General (Family Medicine) Gala Romney Cristopher Estimable, MD as Consulting Physician (Gastroenterology) Kassie Mends, RN as [facility] Beryle Lathe, [facility] (Pharmacist)  Objective:  Lab Results  Component Value Date   CREATININE 0.95 01/12/2021   CREATININE 1.10 (H) 11/25/2020   CREATININE 1.02 (H) 10/30/2020    Lab Results  Component Value Date   HGBA1C 6.4 (H) 11/25/2020   Last diabetic Eye exam:  Lab Results  Component Value Date/Time   HMDIABEYEEXA No Retinopathy 06/08/2016 12:00 AM    Last diabetic Foot exam: No results found for: HMDIABFOOTEX      Component Value Date/Time   CHOL 143 11/25/2020 0913   TRIG 77 11/25/2020 0913   HDL 48 11/25/2020 0913   CHOLHDL 3.0 11/25/2020 0913   CHOLHDL 5.6 05/28/2014 1018   VLDL 16 05/28/2014 1018   LDLCALC 80 11/25/2020 0913    Hepatic Function Latest Ref Rng & Units 11/25/2020 10/30/2020 06/01/2020  Total Protein 6.0 - 8.5 g/dL 7.3 8.1 7.5  Albumin 3.7 - 4.7 g/dL 4.3 4.4 4.3  AST 0 - 40 IU/L $Remov'19 21 21  'Fwewtw$ ALT 0 - 32 IU/L $Remov'16 20 17  'wXFyjZ$ Alk Phosphatase 44 - 121 IU/L 127(H) 117 117  Total Bilirubin 0.0 - 1.2 mg/dL 0.5 0.5 0.4  Bilirubin, Direct 0.00 - 0.40 mg/dL 0.15 - -    No results found for: TSH, FREET4  CBC  Latest Ref Rng & Units 10/30/2020 04/25/2016 04/25/2016  WBC 4.0 - 10.5 K/uL 6.4 - 5.9  Hemoglobin 12.0 - 15.0 g/dL 14.2 14.3 13.7  Hematocrit 36.0 - 46.0 % 44.5 42.0 40.6  Platelets 150 - 400 K/uL 283 - 277    No results found for: VD25OH  Clinical ASCVD: No  The 10-year ASCVD risk score (Arnett DK, et al., 2019) is: 21.7%   Values used to calculate the score:     Age: 49 years     Sex: Female     Is Non-Hispanic African American: Yes     Diabetic: Yes     Tobacco smoker: No     Systolic Blood  Pressure: 132 mmHg     Is BP treated: Yes     HDL Cholesterol: 48 mg/dL     Total Cholesterol: 143 mg/dL    Social History   Tobacco Use  Smoking Status Former   Types: Cigarettes   Quit date: 06/17/1992   Years since quitting: 28.7  Smokeless Tobacco Never  Tobacco Comments   less than a pack a week   BP Readings from Last 3 Encounters:  01/17/21 132/62  10/30/20 (!) 142/72  06/01/20 124/70   Pulse Readings from Last 3 Encounters:  01/17/21 71  10/30/20 65  02/13/20 77   Wt Readings from Last 3 Encounters:  01/17/21 203 lb 9.6 oz (92.4 kg)  12/28/20 198 lb (89.8 kg)  06/01/20 198 lb 12.8 oz (90.2 kg)    Assessment: Review of patient past medical history, allergies, medications, health status, including review of consultants reports, laboratory and other test data, was performed as part of comprehensive evaluation and provision of chronic care management services.   SDOH:  (Social Determinants of Health) assessments and interventions performed:  SDOH Interventions    Flowsheet Row Most Recent Value  SDOH Interventions   SDOH Interventions for the Following Domains Financial Strain  Financial Strain Interventions Other (Comment)  [Approved for medication assistance program for Rybelsus]       CCM Care Plan  Allergies  Allergen Reactions   Farxiga [Dapagliflozin] Other (See Comments)    Frequent urination and excessive thirst    Medications Reviewed Today     Reviewed by Beryle Lathe, [place] (Pharmacist) on 03/28/21 at 1200  Med List Status: <None>   Medication Order Taking? Sig Documenting Provider Last Dose Status Informant  acetaminophen (TYLENOL) 500 MG tablet [id number] Yes Take 1,000 mg by mouth every 6 (six) hours as needed for moderate pain or headache. [provider] Taking Active Self  amLODipine (NORVASC) 5 MG tablet [id number] Yes Take 1 tablet (5 mg total) by mouth daily. Kathyrn Drown, MD Taking Active   blood glucose meter kit  and supplies [id number] Yes Dispense based on patient and insurance preference. Use to check blood sugars once daily . (FOR ICD-10 E10.9, E11.9). Kathyrn Drown, MD Taking Active   chlorhexidine (PERIDEX) 0.12 % solution [id number] Yes FOR OCCASIONAL USE, AFTER BRUSHING, RINSE WITH ONE CAPFUL FOR ONE MINUTE TWICE A DAY THEN SPIT OUT [provider] Taking Active   glucose blood (ACCU-CHEK GUIDE) test strip [id number] Yes TEST BLOOD SUGAR ONCE DAILY (E11.9) Luking, Elayne Snare, MD Taking Active   irbesartan (AVAPRO) 75 MG tablet [id number] Yes Take 1 tablet (75 mg total) by mouth daily. Kathyrn Drown, MD  Active   meclizine (ANTIVERT) 25 MG tablet [id number] Yes Take 1 tablet (25 mg total) by mouth 3 (three) times daily as needed for  dizziness. Fredia Sorrow, MD Taking Active   Polyethyl Glycol-Propyl Glycol [facility] OP) [id number] Yes Place 1 drop into both eyes daily as needed (dry eyes). [provider] Taking Active Self  rosuvastatin (CRESTOR) 40 MG tablet [id number] Yes Take 1 tablet (40 mg total) by mouth daily. Kathyrn Drown, MD Taking Active   Semaglutide (RYBELSUS) 3 MG TABS [id number] No Take 1 tablet by mouth daily before breakfast.  Patient not taking: Reported on 03/07/2021   [provider] Not Taking Active Self           Med Note Rhea Belton Mar 07, 2021  2:24 PM) Patient receives from [facility] patient assistance program   Semaglutide (RYBELSUS) 7 MG TABS [id number] No Take 1 tablet by mouth daily before breakfast.  Patient not taking: Reported on 03/07/2021   [provider] Not Taking Active Self           Med Note Rhea Belton Mar 07, 2021  2:25 PM) Patient receives from [facility] patient assistance program              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, without long-term current use of insulin ([place]) 12/05/2019    Hyperlipidemia associated with type 2 diabetes mellitus ([place]) 12/05/2019   Venous stasis 09/02/2017   Morbid obesity ([place]) 12/06/2015   Pedal edema 08/11/2013   Lumbar pain 08/11/2013   Essential hypertension, benign 05/30/2012   CERVICAL RADICULITIS 09/15/2009    Immunization History  Administered Date(s) Administered   Fluad Quad(high Dose 65+) 12/05/2019   Influenza, High Dose Seasonal PF 01/11/2021   Influenza, Seasonal, Injecte, Preservative Fre 12/06/2015   Influenza,inj,Quad PF,6+ Mos 01/11/2015, 01/03/2017, 11/09/2017, 11/12/2018   Moderna SARS-COV2 Booster Vaccination 02/05/2020   Moderna Sars-Covid-2 Vaccination 04/05/2019, 05/06/2019, 01/11/2021   Pneumococcal Conjugate-13 10/05/2014   Pneumococcal Polysaccharide-23 12/06/2015    Conditions to be addressed/monitored: HTN, HLD, DMII, and CKD Stage 3a  Care Plan : Medication Management  Updates made by Beryle Lathe, [place] since 03/28/2021 12:00 AM     Problem: T2DM, HTN, HLD, CKD   Priority: High  Onset Date: 12/24/2020     Long-Range Goal: Disease Progression Prevention   Start Date: 12/24/2020  Expected End Date: 01/02/2021  Recent Progress: On track  Priority: High  Note:   Current Barriers:  Unable to independently afford treatment regimen Unable to achieve control of hyperlipidemia Suboptimal therapeutic regimen for diabetes  Pharmacist Clinical Goal(s):  Through collaboration with PharmD and provider, patient will: Verbalize ability to afford treatment regimen Achieve control of hyperlipidemia as evidenced by improved LDL Adhere to plan to optimize therapeutic regimen for diabetes as evidenced by report of adherence to recommended medication management changes   Interventions: 1:1 collaboration with Kathyrn Drown, MD regarding development and update of comprehensive plan of care as evidenced by provider attestation and co-signature Inter-disciplinary care team collaboration (see  longitudinal plan of care) Comprehensive medication review performed; medication list updated in electronic medical record  Type 2 Diabetes - Goal on Track (progressing): YES.: Controlled; Most recent A1c at goal of <7% per ADA guidelines Current medications:  none Patient has been approved for Rybelsus through [employer] patient assistance program through 01/16/22. They are still in the process of shipping 4 month supply of medication to our office. Patient will be notified when we receive.  Intolerances:  metformin (unknown) and [place] (  frequent urination and excessive thirst) Taking medications as directed: n/a Side effects thought to be attributed to current medication regimen: n/a Hypoglycemia prevention: not indicated at this time Current meal patterns: not discussed today Current exercise: not discussed today On a statin: yes On aspirin 81 mg daily: no Last microalbumin/creatinine ratio: 105; on an ACEi/ARB: yes Last eye exam: overdue Last foot exam: completed within last year Pneumonia vaccine: series complete Influenza vaccine: up to date Current glucose readings:  fasting: most are at goal of 80-130 per ADA guidelines; pre/post-prandial (afternoon) at goal of <180 per ADA guidelines Plan to add semaglutide (Rybelsus) 3 mg by mouth at least 30 minutes before the first food, beverage, or other oral medications of the day with no more than 4 ounces of plain water only for 30 days then increase to $RemoveBef'7mg'ZrKqAKDtsr$  by mouth daily  Hypertension - Condition stable. Not addressed this visit.: Blood pressure under good control. Blood pressure is at goal of <130/80 mmHg per 2017 AHA/ACC guidelines. Current medications: valsartan 40 mg by mouth once daily and amlodipine 5 mg by mouth once daily Intolerances: none Taking medications as directed: yes Side effects thought to be attributed to current medication regimen: no Current home blood pressure: not discussed today Patient reports valsartan is no  longer preferred on her insurance formulary and copay is $120 for 90 day supply. Instructed patient to contact insurance to determine preferred angiotensin receptor blocker. Patient reports she spoke with them this morning and they informed her that irbesartan is preferred and copay should be $6.  Continue amlodipine 5 mg by mouth once daily Add irbesartan 75 mg by mouth once daily Discontinue valsartan 40 mg by mouth once daily Encourage dietary sodium restriction/DASH diet Recommend regular aerobic exercise Recommend home blood pressure monitoring to discuss at next visit  Hyperlipidemia - Condition stable. Not addressed this visit.: Uncontrolled. LDL above goal of <70 due to very high risk given diabetes + at least 1 additional major risk factor (hypertension and chronic kidney disease (CKD)) per 2020 AACE/ACE guidelines. Triglycerides at goal of <150 per 2020 AACE/ACE guidelines. Current medications: rosuvastatin 40 mg by mouth once daily Intolerances: none Taking medications as directed: yes Side effects thought to be attributed to current medication regimen: no Continue rosuvastatin 40 mg by mouth once daily Re-check lipid panel a few days before next primary care provider visit Encourage dietary reduction of high fat containing foods such as butter, nuts, bacon, egg yolks, etc. Recommend regular aerobic exercise If LDL remains above goal, will consider addition of ezetimibe 10 mg by mouth daily  Chronic Kidney Disease Stage 3a - GFR 45-59 (Mild to Moderately Reduced Function) - Goal on Track (progressing): YES.: Follows with Dr. Marval Regal Appropriately managed Current medications: valsartan 40 mg by mouth once daily and rosuvastatin 40 mg by mouth once daily ARB added for elevated urine microalbumin in type-2 diabetes. Repeat BMP with stable/improved renal function. Patient reports valsartan is no longer preferred on her insurance formulary and copay is $120 for 90 day supply.  Instructed patient to contact insurance to determine preferred angiotensin receptor blocker. Patient reports she spoke with them this morning and they informed her that irbesartan is preferred and copay should be $6.  Wilder Glade discontinued due to frequent urination and excessive thirst Most recent GFR: 64 mL/min Most recent microalbumin/creatinine ratio: 105 Encouraged healthy diet including more fruits and vegetables Recommend adequate hydration  Recommend regular aerobic exercise Continue statin Add irbesartan 75 mg by mouth once daily Discontinue valsartan 40 mg  by mouth once daily Plan to add Rybelsus as above  Patient Goals/Self-Care Activities Patient will:  Take medications as prescribed Check blood pressure at least once daily, document, and provide at future appointments Collaborate with provider on medication access solutions Engage in dietary modifications by less frequent dining out and decreased fat intake  Follow Up Plan: Telephone follow up appointment with care management team member scheduled for: 04/18/21       Medication Assistance:  Rybelsus obtained through [place] medication assistance program.  Enrollment ends 01/26/22  Patient's preferred pharmacy is:  [id number] - [place], [address] [place] [address] Phone: [phone] Fax: [phone]  Follow Up:  Patient agrees to Care Plan and Follow-up.  Plan: Telephone follow up appointment with care management team member scheduled for:  04/18/21  Kennon Holter, PharmD, BCACP, CPP Clinical Pharmacist Practitioner [place] [phone]

## 2021-03-29 DIAGNOSIS — E785 Hyperlipidemia, unspecified: Secondary | ICD-10-CM | POA: Diagnosis not present

## 2021-03-29 DIAGNOSIS — E119 Type 2 diabetes mellitus without complications: Secondary | ICD-10-CM | POA: Diagnosis not present

## 2021-03-29 DIAGNOSIS — I10 Essential (primary) hypertension: Secondary | ICD-10-CM

## 2021-03-29 DIAGNOSIS — E1169 Type 2 diabetes mellitus with other specified complication: Secondary | ICD-10-CM | POA: Diagnosis not present

## 2021-04-04 DIAGNOSIS — N182 Chronic kidney disease, stage 2 (mild): Secondary | ICD-10-CM | POA: Diagnosis not present

## 2021-04-04 DIAGNOSIS — E785 Hyperlipidemia, unspecified: Secondary | ICD-10-CM | POA: Diagnosis not present

## 2021-04-04 DIAGNOSIS — R809 Proteinuria, unspecified: Secondary | ICD-10-CM | POA: Diagnosis not present

## 2021-04-04 DIAGNOSIS — I129 Hypertensive chronic kidney disease with stage 1 through stage 4 chronic kidney disease, or unspecified chronic kidney disease: Secondary | ICD-10-CM | POA: Diagnosis not present

## 2021-04-04 DIAGNOSIS — Z20822 Contact with and (suspected) exposure to covid-19: Secondary | ICD-10-CM | POA: Diagnosis not present

## 2021-04-04 DIAGNOSIS — E1122 Type 2 diabetes mellitus with diabetic chronic kidney disease: Secondary | ICD-10-CM | POA: Diagnosis not present

## 2021-04-18 ENCOUNTER — Ambulatory Visit (INDEPENDENT_AMBULATORY_CARE_PROVIDER_SITE_OTHER): Payer: Medicare Other | Admitting: Pharmacist

## 2021-04-18 DIAGNOSIS — E1169 Type 2 diabetes mellitus with other specified complication: Secondary | ICD-10-CM

## 2021-04-18 DIAGNOSIS — R809 Proteinuria, unspecified: Secondary | ICD-10-CM

## 2021-04-18 DIAGNOSIS — E119 Type 2 diabetes mellitus without complications: Secondary | ICD-10-CM

## 2021-04-18 DIAGNOSIS — E785 Hyperlipidemia, unspecified: Secondary | ICD-10-CM

## 2021-04-18 DIAGNOSIS — I10 Essential (primary) hypertension: Secondary | ICD-10-CM

## 2021-04-18 MED ORDER — IRBESARTAN 150 MG PO TABS: 150.0000 mg | ORAL_TABLET | Freq: Every day | ORAL | 1 refills | Status: DC

## 2021-04-18 NOTE — Patient Instructions (Addendum)
[name],  It was great to talk to you today!  Please call me with any questions or concerns.  Visit Information  Following are the goals we discussed today:   Goals Addressed             This Visit's Progress    Medication Management       Patient Goals/Self-Care Activities Patient will:  Take medications as prescribed Check blood pressure at least once daily, document, and provide at future appointments Collaborate with provider on medication access solutions Engage in dietary modifications by less frequent dining out and decreased fat intake           Follow-up plan: Telephone follow up appointment with care management team member scheduled for:  06/17/21  Patient verbalizes understanding of instructions and care plan provided today and agrees to view in [place]. Active MyChart status confirmed with patient.    Please call the care guide team at [phone] if you need to cancel or reschedule your appointment.   Kennon Holter, PharmD, [place], CPP Clinical Pharmacist Practitioner [place] [phone]

## 2021-04-18 NOTE — Chronic Care Management (AMB) (Signed)
Chronic Care Management Pharmacy Note  04/18/2021 Name:  CATHYRN DEAS MRN:  [id number] DOB:  May 15, 1950  Summary: Type 2 Diabetes Controlled; Most recent A1c at goal of <7% per ADA guidelines Current medications: semaglutide (Rybelsus) 3 mg by mouth daily for 30 days then increase to 7 mg by mouth daily Patient has been approved for Rybelsus through [employer] patient assistance program through 01/16/22. Patient received 1 bottle (30 tablets) of 86m and 1 bottle (30 tablets) of 744mwithin last 1-2 weeks.  Current glucose readings:  Patient reports recent fasting blood glucose of 76-110 without hypoglycemia which is at goal of 80-130 per ADA guidelines Continue semaglutide (Rybelsus) 3 mg by mouth for 30 days then increase to 47m61my mouth daily Patient reminded to take Rybelsus at least 30 minutes before the first food, beverage, or other oral medications of the day with no more than 4 ounces of plain water only  120 day supply refill of Rybelsus 7 mg tablets was faxed to [employer] patient assistance program today after discussion with primary care provider.  Repeat A1c in 3 weeks at primary care provider visit  Hypertension/chronic kidney disease Blood pressure under good control Recent changes: switched from valsartan to irbesartan due to insurance formulary preference Side effects thought to be attributed to current medication regimen:  peripheral edema which could be related to amlodipine After recent visit with nephrology, received message from Dr. GolMoshe Ciproth recommendation to discontinue amlodipine and increase angiotensin receptor blocker due to persistent proteinuria and inability to tolerate SGLT-2 inhibitor. Discussed with primary care provider and patient. All in agreement to make therapy change. Increase irbesartan to 150 mg by mouth daily. Patient instructed to take two 75 mg tablets until she runs out and then switch to one 150 mg tablet by mouth daily.  Discontinue  amlodipine Check BMP in 2-3 weeks prior to primary care provider visit Repeat blood pressure in 3 weeks at office visit with primary care provider to ensure still controlled  Hyperlipidemia  LDL above goal of <70 due to very high risk given diabetes + at least 1 additional major risk factor (hypertension and chronic kidney disease (CKD)) per 2020 AACE/ACE guidelines.  Continue rosuvastatin 40 mg by mouth once daily Recheck lipid panel in 3 weeks at primary care provider visit. If LDL remains above goal, will consider addition of ezetimibe 10 mg by mouth daily.  Subjective: AnnJALICIA ROSZAK an 71 5o. year old female who is a primary patient of Luking, ScoElayne SnareD.  The CCM team was consulted for assistance with disease management and care coordination needs.    Engaged with patient by telephone for follow up visit in response to provider referral for pharmacy case management and/or care coordination services.   Consent to Services:  The patient was given information about Chronic Care Management services, agreed to services, and gave verbal consent prior to initiation of services.  Please see initial visit note for detailed documentation.   Patient Care Team: LukKathyrn DrownD as PCP - General (Family Medicine) RouGala RomneybCristopher EstimableD as Consulting Physician (Gastroenterology) FarKassie MendsN as TriChrisneynagement WalBeryle LatheP[facility]/Odessaharmacist)  Objective:  Lab Results  Component Value Date   CREATININE 0.95 01/12/2021   CREATININE 1.10 (H) 11/25/2020   CREATININE 1.02 (H) 10/30/2020    Lab Results  Component Value Date   HGBA1C 6.4 (H) 11/25/2020   Last diabetic Eye exam:  Lab Results  Component  Value Date/Time   HMDIABEYEEXA No Retinopathy 06/08/2016 12:00 AM    Last diabetic Foot exam: No results found for: HMDIABFOOTEX      Component Value Date/Time   CHOL 143 11/25/2020 0913   TRIG 77 11/25/2020 0913   HDL 48 11/25/2020 0913    CHOLHDL 3.0 11/25/2020 0913   CHOLHDL 5.6 05/28/2014 1018   VLDL 16 05/28/2014 1018   LDLCALC 80 11/25/2020 0913    Hepatic Function Latest Ref Rng & Units 11/25/2020 10/30/2020 06/01/2020  Total Protein 6.0 - 8.5 g/dL 7.3 8.1 7.5  Albumin 3.7 - 4.7 g/dL 4.3 4.4 4.3  AST 0 - 40 IU/L _0 ALT 0 - 32 IU/L _1 Alk Phosphatase 44 - 121 IU/L 127(H) 117 117  Total Bilirubin 0.0 - 1.2 mg/dL 0.5 0.5 0.4  Bilirubin, Direct 0.00 - 0.40 mg/dL 0.15 - -    No results found for: TSH, FREET4  CBC Latest Ref Rng & Units 10/30/2020 04/25/2016 04/25/2016  WBC 4.0 - 10.5 K/uL 6.4 - 5.9  Hemoglobin 12.0 - 15.0 g/dL 14.2 14.3 13.7  Hematocrit 36.0 - 46.0 % 44.5 42.0 40.6  Platelets 150 - 400 K/uL 283 - 277    No results found for: VD25OH  Clinical ASCVD: No  The 10-year ASCVD risk score (Arnett DK, et al., 2019) is: 21.7%   Values used to calculate the score:     Age: 71 years     Sex: Female     Is Non-Hispanic African American: Yes     Diabetic: Yes     Tobacco smoker: No     Systolic Blood Pressure: 450 mmHg     Is BP treated: Yes     HDL Cholesterol: 48 mg/dL     Total Cholesterol: 143 mg/dL    Social History   Tobacco Use  Smoking Status Former   Types: Cigarettes   Quit date: 06/17/1992   Years since quitting: 28.8  Smokeless Tobacco Never  Tobacco Comments   less than a pack a week   BP Readings from Last 3 Encounters:  01/17/21 132/62  10/30/20 (!) 142/72  06/01/20 124/70   Pulse Readings from Last 3 Encounters:  01/17/21 71  10/30/20 65  02/13/20 77   Wt Readings from Last 3 Encounters:  01/17/21 203 lb 9.6 oz (92.4 kg)  12/28/20 198 lb (89.8 kg)  06/01/20 198 lb 12.8 oz (90.2 kg)    Assessment: Review of patient past medical history, allergies, medications, health status, including review of consultants reports, laboratory and other test data, was performed as part of comprehensive evaluation and provision of chronic care management services.   SDOH:   (Social Determinants of Health) assessments and interventions performed:    CCM Care Plan  Allergies  Allergen Reactions   Farxiga [Dapagliflozin] Other (See Comments)    Frequent urination and excessive thirst    Medications Reviewed Today     Reviewed by Beryle Lathe, [facility] (Pharmacist) on 04/18/21 at 1355  Med List Status: <None>   Medication Order Taking? Sig Documenting Provider Last Dose Status Informant  acetaminophen (TYLENOL) 500 MG tablet [id number] Yes Take 1,000 mg by mouth every 6 (six) hours as needed for moderate pain or headache. [provider] Taking Active Self  amLODipine (NORVASC) 5 MG tablet [id number] Yes Take 1 tablet (5 mg total) by mouth daily. Kathyrn Drown, MD Taking Active   blood glucose meter kit and supplies [id number] Yes Dispense based on patient and  insurance preference. Use to check blood sugars once daily . (FOR ICD-10 E10.9, E11.9). Kathyrn Drown, MD Taking Active   chlorhexidine (PERIDEX) 0.12 % solution [id number] No FOR OCCASIONAL USE, AFTER BRUSHING, RINSE WITH ONE CAPFUL FOR ONE MINUTE TWICE A DAY THEN SPIT OUT [provider] Unknown Active   glucose blood (ACCU-CHEK GUIDE) test strip [id number] Yes TEST BLOOD SUGAR ONCE DAILY (E11.9) Luking, Elayne Snare, MD Taking Active   irbesartan (AVAPRO) 75 MG tablet [id number] Yes Take 1 tablet (75 mg total) by mouth daily. Kathyrn Drown, MD Taking Active   meclizine (ANTIVERT) 25 MG tablet [id number] Yes Take 1 tablet (25 mg total) by mouth 3 (three) times daily as needed for dizziness. Fredia Sorrow, MD Taking Active   Polyethyl Glycol-Propyl Glycol [facility] OP) [id number] Yes Place 1 drop into both eyes daily as needed (dry eyes). [provider] Taking Active Self  rosuvastatin (CRESTOR) 40 MG tablet [id number] Yes Take 1 tablet (40 mg total) by mouth daily. Kathyrn Drown, MD Taking Active   Semaglutide (RYBELSUS) 3 MG TABS [id number] Yes Take 1 tablet by mouth daily  before breakfast. [provider] Taking Active Self           Med Note Rhea Belton Mar 07, 2021  2:24 PM) Patient receives from [facility] patient assistance program   Semaglutide (RYBELSUS) 7 MG TABS [id number] No Take 1 tablet by mouth daily before breakfast.  Patient not taking: Reported on 03/07/2021   [provider] Not Taking Active Self           Med Note Rhea Belton Mar 07, 2021  2:25 PM) Patient receives from [facility] patient assistance program              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, without long-term current use of insulin ([place]) 12/05/2019   Hyperlipidemia associated with type 2 diabetes mellitus ([place]) 12/05/2019   Venous stasis 09/02/2017   Morbid obesity ([place]) 12/06/2015   Pedal edema 08/11/2013   Lumbar pain 08/11/2013   Essential hypertension, benign 05/30/2012   CERVICAL RADICULITIS 09/15/2009    Immunization History  Administered Date(s) Administered   Fluad Quad(high Dose 65+) 12/05/2019   Influenza, High Dose Seasonal PF 01/11/2021   Influenza, Seasonal, Injecte, Preservative Fre 12/06/2015   Influenza,inj,Quad PF,6+ Mos 01/11/2015, 01/03/2017, 11/09/2017, 11/12/2018   Moderna SARS-COV2 Booster Vaccination 02/05/2020   Moderna Sars-Covid-2 Vaccination 04/05/2019, 05/06/2019, 01/11/2021   Pneumococcal Conjugate-13 10/05/2014   Pneumococcal Polysaccharide-23 12/06/2015    Conditions to be addressed/monitored: HTN, HLD, DMII, and CKD Stage 3a  Care Plan : Medication Management  Updates made by Beryle Lathe, [place] since 04/18/2021 12:00 AM     Problem: T2DM, HTN, HLD, CKD   Priority: High  Onset Date: 12/24/2020     Long-Range Goal: Disease Progression Prevention   Start Date: 12/24/2020  Expected End Date: 01/02/2021  Recent Progress: On track  Priority: High  Note:   Current  Barriers:  Unable to independently afford treatment regimen Unable to achieve control of hyperlipidemia  Pharmacist Clinical Goal(s):  Through collaboration with PharmD and provider, patient will: Verbalize ability to afford treatment regimen Achieve control of hyperlipidemia as evidenced by improved LDL   Interventions: 1:1 collaboration with Kathyrn Drown, MD regarding development and update of comprehensive plan of care as evidenced by provider attestation and co-signature Inter-disciplinary  care team collaboration (see longitudinal plan of care) Comprehensive medication review performed; medication list updated in electronic medical record  Type 2 Diabetes - Goal on Track (progressing): YES.: Controlled; Most recent A1c at goal of <7% per ADA guidelines Current medications: semaglutide (Rybelsus) 3 mg by mouth daily for 30 days then increase to 7 mg by mouth daily Patient has been approved for Rybelsus through [employer] patient assistance program through 01/16/22. Patient received 1 bottle (30 tablets) of 69m and 1 bottle (30 tablets) of 7453mwithin last 1-2 weeks.  Intolerances:  metformin (unknown) and Farxiga (frequent urination and excessive thirst) Taking medications as directed: yes Side effects thought to be attributed to current medication regimen:  maybe, patient reports some mild GI discomfort since starting Rybelsus but it has been intermittent Hypoglycemia prevention: not indicated at this time Current meal patterns: not discussed today Current exercise: not discussed today On a statin: yes On aspirin 81 mg daily: no Last microalbumin/creatinine ratio: 105; on an ACEi/ARB: yes Last eye exam: overdue Last foot exam: completed within last year Pneumonia vaccine: series complete Influenza vaccine: up to date Current glucose readings:  Patient reports recent fasting blood glucose of 76-110 without hypoglycemia which is at goal of 80-130 per ADA guidelines Continue  semaglutide (Rybelsus) 3 mg by mouth for 30 days then increase to 53m26my mouth daily Patient reminded to take Rybelsus at least 30 minutes before the first food, beverage, or other oral medications of the day with no more than 4 ounces of plain water only  120 day supply refill of Rybelsus 7 mg tablets was faxed to [employer] patient assistance program today after discussion with primary care provider.  Repeat A1c in 3 weeks at primary care provider visit  Hypertension - Goal on Track (progressing): YES.: Blood pressure under good control. Blood pressure is at goal of <130/80 mmHg per 2017 AHA/ACC guidelines. Current medications: irbesartan 75 mg by mouth once daily and amlodipine 5 mg by mouth once daily Recent changes: switched from valsartan to irbesartan due to insurance formulary preference Intolerances: none Taking medications as directed: yes Side effects thought to be attributed to current medication regimen:  peripheral edema which could be related to amlodipine Current home blood pressure: not discussed today After recent visit with nephrology, received message from Dr. GolMoshe Ciproth recommendation to discontinue amlodipine and increase angiotensin receptor blocker due to persistent proteinuria and inability to tolerate SGLT-2 inhibitor. Discussed with primary care provider and patient. All in agreement to make therapy change. Increase irbesartan to 150 mg by mouth daily. Patient instructed to take two 75 mg tablets until she runs out and then switch to one 150 mg tablet by mouth daily.  Discontinue amlodipine Check BMP in 2-3 weeks prior to primary care provider visit Repeat blood pressure in 3 weeks at office visit with primary care provider to ensure still controlled Encourage dietary sodium restriction/DASH diet Recommend regular aerobic exercise Recommend home blood pressure monitoring to discuss at next visit  Hyperlipidemia - Condition stable. Not addressed this  visit.: Uncontrolled. LDL above goal of <70 due to very high risk given diabetes + at least 1 additional major risk factor (hypertension and chronic kidney disease (CKD)) per 2020 AACE/ACE guidelines. Triglycerides at goal of <150 per 2020 AACE/ACE guidelines. Current medications: rosuvastatin 40 mg by mouth once daily Intolerances: none Taking medications as directed: yes Side effects thought to be attributed to current medication regimen: no Continue rosuvastatin 40 mg by mouth once daily Encourage dietary reduction of high  fat containing foods such as butter, nuts, bacon, egg yolks, etc. Recommend regular aerobic exercise Recheck lipid panel in 3 weeks at primary care provider visit. If LDL remains above goal, will consider addition of ezetimibe 10 mg by mouth daily.  Chronic Kidney Disease Stage 3a - GFR 45-59 (Mild to Moderately Reduced Function) - Goal on Track (progressing): YES.: Follows with Dr. Marval Regal Appropriately managed Current medications: irbesartan 75 mg by mouth once daily, semaglutide (Rybelsus) 3 mg by mouth daily, and rosuvastatin 40 mg by mouth once daily Recent changes: switched from valsartan to irbesartan due to insurance formulary preference [place] discontinued due to frequent urination and excessive thirst Most recent GFR: 64 mL/min Most recent microalbumin/creatinine ratio: 105 Encouraged healthy diet including more fruits and vegetables Recommend adequate hydration  Recommend regular aerobic exercise Continue statin and GLP-1 agonist Increase irbesartan to 150 mg by mouth once daily as above. Check BMP in 2-3 weeks prior to primary care provider visit.  Patient Goals/Self-Care Activities Patient will:  Take medications as prescribed Check blood pressure at least once daily, document, and provide at future appointments Collaborate with provider on medication access solutions Engage in dietary modifications by less frequent dining out and decreased fat  intake  Follow Up Plan: Telephone follow up appointment with care management team member scheduled for: 06/17/21       Medication Assistance:  Rybelsus obtained through [place] medication assistance program.  Enrollment ends 01/26/22  Patient's preferred pharmacy is:  [id number] [place] [address]Phone: [phone]: [phone] Follow Up:  Patient agrees to Care Plan and Follow-up.  Plan: Telephone follow up appointment with care management team member scheduled for:  06/17/21  CKennon Holter PharmD, BCACP, CPP Clinical Pharmacist Practitioner [phone]

## 2021-04-20 ENCOUNTER — Telehealth: Payer: Medicare Other

## 2021-04-26 DIAGNOSIS — I10 Essential (primary) hypertension: Secondary | ICD-10-CM | POA: Diagnosis not present

## 2021-04-26 DIAGNOSIS — E1169 Type 2 diabetes mellitus with other specified complication: Secondary | ICD-10-CM | POA: Diagnosis not present

## 2021-04-26 DIAGNOSIS — E785 Hyperlipidemia, unspecified: Secondary | ICD-10-CM | POA: Diagnosis not present

## 2021-04-26 DIAGNOSIS — E119 Type 2 diabetes mellitus without complications: Secondary | ICD-10-CM

## 2021-04-26 LAB — HM DIABETES EYE EXAM

## 2021-04-27 ENCOUNTER — Ambulatory Visit: Payer: Medicare Other | Admitting: Family Medicine

## 2021-04-29 DIAGNOSIS — E119 Type 2 diabetes mellitus without complications: Secondary | ICD-10-CM | POA: Diagnosis not present

## 2021-04-29 DIAGNOSIS — I10 Essential (primary) hypertension: Secondary | ICD-10-CM | POA: Diagnosis not present

## 2021-04-29 DIAGNOSIS — E785 Hyperlipidemia, unspecified: Secondary | ICD-10-CM | POA: Diagnosis not present

## 2021-04-29 DIAGNOSIS — E1169 Type 2 diabetes mellitus with other specified complication: Secondary | ICD-10-CM | POA: Diagnosis not present

## 2021-04-30 LAB — LIPID PANEL
Chol/HDL Ratio: 2.5 ratio (ref 0.0–4.4)
Cholesterol, Total: 113 mg/dL (ref 100–199)
HDL: 46 mg/dL (ref >39)
LDL Chol Calc (NIH): 53 mg/dL (ref 0–99)
Triglycerides: 64 mg/dL (ref 0–149)
VLDL Cholesterol Cal: 14 mg/dL (ref 5–40)

## 2021-04-30 LAB — BASIC METABOLIC PANEL
BUN/Creatinine Ratio: 18 (ref 12–28)
BUN: 20 mg/dL (ref 8–27)
CO2: 20 mmol/L (ref 20–29)
Calcium: 9.4 mg/dL (ref 8.7–10.3)
Chloride: 106 mmol/L (ref 96–106)
Creatinine, Ser: 1.09 mg/dL — ABNORMAL HIGH (ref 0.57–1.00)
Glucose: 104 mg/dL — ABNORMAL HIGH (ref 70–99)
Potassium: 4.2 mmol/L (ref 3.5–5.2)
Sodium: 140 mmol/L (ref 134–144)
eGFR: 54 mL/min/{1.73_m2} — ABNORMAL LOW (ref >59)

## 2021-04-30 LAB — HEMOGLOBIN A1C
Est. average glucose Bld gHb Est-mCnc: 140 mg/dL
Hgb A1c MFr Bld: 6.5 % — ABNORMAL HIGH (ref 4.8–5.6)

## 2021-05-04 ENCOUNTER — Other Ambulatory Visit: Payer: Self-pay

## 2021-05-04 ENCOUNTER — Ambulatory Visit (INDEPENDENT_AMBULATORY_CARE_PROVIDER_SITE_OTHER): Payer: Medicare Other | Admitting: Family Medicine

## 2021-05-04 VITALS — BP 124/68 | HR 76 | Temp 97.6°F | Ht 59.0 in | Wt 207.0 lb

## 2021-05-04 DIAGNOSIS — E785 Hyperlipidemia, unspecified: Secondary | ICD-10-CM | POA: Diagnosis not present

## 2021-05-04 DIAGNOSIS — N1831 Chronic kidney disease, stage 3a: Secondary | ICD-10-CM | POA: Diagnosis not present

## 2021-05-04 DIAGNOSIS — I10 Essential (primary) hypertension: Secondary | ICD-10-CM

## 2021-05-04 DIAGNOSIS — E1169 Type 2 diabetes mellitus with other specified complication: Secondary | ICD-10-CM

## 2021-05-04 DIAGNOSIS — E0822 Diabetes mellitus due to underlying condition with diabetic chronic kidney disease: Secondary | ICD-10-CM | POA: Diagnosis not present

## 2021-05-04 NOTE — Progress Notes (Signed)
? ?  Subjective:  ? ? Patient ID: [name], female    DOB: 1950-06-20, 71 y.o.   MRN: [id number] ? ?HPI ? ?Patient here for 3 month follow up.  ?Essential hypertension, benign - Plan: Hemoglobin H0Q, Basic Metabolic Panel (BMET), Lipid Profile, Hepatic function panel ? ?Hyperlipidemia associated with type 2 diabetes mellitus ([place]) - Plan: Hemoglobin M5H, Basic Metabolic Panel (BMET), Lipid Profile, Hepatic function panel ? ?Diabetes mellitus due to underlying condition with stage 3a chronic kidney disease, without long-term current use of insulin ([place]) - Plan: Hemoglobin Q4O, Basic Metabolic Panel (BMET), Lipid Profile, Hepatic function panel ? ?Morbid obesity ([place]) - Plan: Hemoglobin N6E, Basic Metabolic Panel (BMET), Lipid Profile, Hepatic function panel ?She is trying to watch her diet her weight is elevated ?We did discuss healthy eating ?Also has cholesterol issues and diabetes issues ?Diabetes not under great control ?We did discuss minimizing starches taking her medicines ?She also has blood pressure issues for which she is taking her medication and under decent control currently ?Review of Systems ? ?   ?Objective:  ? Physical Exam ? ?General-in no acute distress ?Eyes-no discharge ?Lungs-respiratory rate normal, CTA ?CV-no murmurs,RRR ?Extremities skin warm dry no edema ?Neuro grossly normal ?Behavior normal, alert ? ? ? ?   ?Assessment & Plan:  ?1. Essential hypertension, benign ?Bp good ?Continue as is labes reviewed recheck labs before next OV ?- Hemoglobin X5M ?- Basic Metabolic Panel (BMET) ?- Lipid Profile ?- Hepatic function panel ? ?2. Hyperlipidemia associated with type 2 diabetes mellitus ([place]) ?Continue meds recheck if possible ?- Hemoglobin W4X ?- Basic Metabolic Panel (BMET) ?- Lipid Profile ?- Hepatic function panel ? ?3. Diabetes mellitus due to underlying condition with stage 3a chronic kidney disease, without long-term current use of insulin ([place]) ?A1C great tolerating Rybelsus about to  go to 7 mg ?Goal A1C 6.5 and weight to reduce ?Healthy diet ?- Hemoglobin L2G ?- Basic Metabolic Panel (BMET) ?- Lipid Profile ?- Hepatic function panel ? ?4. Morbid obesity ([place]) ?Healthy diet, portion control and diet and activity ?- Hemoglobin M0N ?- Basic Metabolic Panel (BMET) ?- Lipid Profile ?- Hepatic function panel ? ? ?

## 2021-05-04 NOTE — Patient Instructions (Signed)
It was good to see you today ? ?We will see you back in approximately 4 months please follow-up at that time if you need anything before then let us know. ? ?Otherwise please try to do your blood work right before that follow-up visit. ? ?TakeCare-Dr. Nicki Reaper ?

## 2021-05-09 DIAGNOSIS — Z20822 Contact with and (suspected) exposure to covid-19: Secondary | ICD-10-CM | POA: Diagnosis not present

## 2021-05-15 DIAGNOSIS — Z20822 Contact with and (suspected) exposure to covid-19: Secondary | ICD-10-CM | POA: Diagnosis not present

## 2021-05-21 ENCOUNTER — Other Ambulatory Visit: Payer: Self-pay | Admitting: Family Medicine

## 2021-06-01 ENCOUNTER — Ambulatory Visit (INDEPENDENT_AMBULATORY_CARE_PROVIDER_SITE_OTHER): Payer: Medicare Other | Admitting: *Deleted

## 2021-06-01 DIAGNOSIS — N1831 Chronic kidney disease, stage 3a: Secondary | ICD-10-CM

## 2021-06-01 DIAGNOSIS — I10 Essential (primary) hypertension: Secondary | ICD-10-CM

## 2021-06-01 NOTE — Patient Instructions (Signed)
Visit Information ? ?Thank you for taking time to visit with me today. Please don't hesitate to contact me if I can be of assistance to you before our next scheduled telephone appointment. ? ?Following are the goals we discussed today:  ?Take medications as prescribed   ?Attend all scheduled provider appointments ?Perform all self care activities independently  ?Perform IADL's (shopping, preparing meals, housekeeping, managing finances) independently ?Call provider office for new concerns or questions  ?check blood sugar at prescribed times: twice daily ?enter blood sugar readings and medication or insulin into daily log ?take the blood sugar log to all doctor visits ?take the blood sugar meter to all doctor visits ?fill half of plate with vegetables ?read food labels for fat, fiber, carbohydrates and portion size ?keep feet up while sitting ?wash and dry feet carefully every day ?wear comfortable, cotton socks ?wear comfortable, well-fitting shoes ?check blood pressure 3 times per week ?write blood pressure results in a log or diary ?take blood pressure log to all doctor appointments ?call doctor for signs and symptoms of high blood pressure ?keep all doctor appointments ?take medications for blood pressure exactly as prescribed ?eat more whole grains, fruits and vegetables, lean meats and healthy fats ?If you need assistance completing advanced directives please call RN care manager at [phone] ?Follow low sodium diet- read food labels- avoid fast food, salty snacks ?Practice good handwashing ?Try to do some type of exercise daily, walking is good ? ?Our next appointment is by telephone on 08/24/21 at 1045 am ? ?Please call the care guide team at [phone] if you need to cancel or reschedule your appointment.  ? ?If you are experiencing a Mental Health or [place] or need someone to talk to, please call the Suicide and Crisis Lifeline: 988 ?call the [place] National Suicide Prevention Lifeline:  6848000494 or TTY: 228 076 2539 TTY [phone]) to talk to a trained counselor ?call 1-800-273-TALK (toll free, 24 hour hotline) ?go to [facility] [address] [place] [phone]) ?call the [facility]: [phone] ?call 911  ? ?Patient verbalizes understanding of instructions and care plan provided today and agrees to view in [place]. Active MyChart status confirmed with patient.   ? ?Jacqlyn Larsen RNC, BSN ?RN Case Manager ?[place] ?[phone] ? ?

## 2021-06-01 NOTE — Chronic Care Management (AMB) (Signed)
?Chronic Care Management  ? ?CCM RN Visit Note ? ?06/01/2021 ?Name: [name] MRN: [id number] DOB: Jun 30, 1950 ? ?Subjective: ?[name] is a 71 y.o. year old female who is a primary care patient of Luking, Elayne Snare, MD. The care management team was consulted for assistance with disease management and care coordination needs.   ? ?Engaged with patient by telephone for follow up visit in response to provider referral for case management and/or care coordination services.  ? ?Consent to Services:  ?The patient was given information about Chronic Care Management services, agreed to services, and gave verbal consent prior to initiation of services.  Please see initial visit note for detailed documentation.  ? ?Patient agreed to services and verbal consent obtained.  ? ?Assessment: Review of patient past medical history, allergies, medications, health status, including review of consultants reports, laboratory and other test data, was performed as part of comprehensive evaluation and provision of chronic care management services.  ? ?SDOH (Social Determinants of Health) assessments and interventions performed:   ? ?CCM Care Plan ? ?Allergies  ?Allergen Reactions  ? Wilder Glade [Dapagliflozin] Other (See Comments)  ?  Frequent urination and excessive thirst  ? ? ?Outpatient Encounter Medications as of 06/01/2021  ?Medication Sig Note  ? acetaminophen (TYLENOL) 500 MG tablet Take 1,000 mg by mouth every 6 (six) hours as needed for moderate pain or headache.   ? blood glucose meter kit and supplies Dispense based on patient and insurance preference. Use to check blood sugars once daily . (FOR ICD-10 E10.9, E11.9).   ? chlorhexidine (PERIDEX) 0.12 % solution FOR OCCASIONAL USE, AFTER BRUSHING, RINSE WITH ONE CAPFUL FOR ONE MINUTE TWICE A DAY THEN SPIT OUT   ? glucose blood (ACCU-CHEK GUIDE) test strip TEST BLOOD SUGAR ONCE DAILY (E11.9)   ? irbesartan (AVAPRO) 150 MG tablet Take 1 tablet (150 mg total) by mouth daily.   ?  meclizine (ANTIVERT) 25 MG tablet Take 1 tablet (25 mg total) by mouth 3 (three) times daily as needed for dizziness.   ? Polyethyl Glycol-Propyl Glycol (SYSTANE OP) Place 1 drop into both eyes daily as needed (dry eyes).   ? rosuvastatin (CRESTOR) 40 MG tablet Take 1 tablet (40 mg total) by mouth daily.   ? Semaglutide (RYBELSUS) 7 MG TABS Take 1 tablet by mouth daily before breakfast. 03/07/2021: Patient receives from [facility] patient assistance program  ?  ? Semaglutide (RYBELSUS) 3 MG TABS Take 1 tablet by mouth daily before breakfast. (Patient not taking: Reported on 06/01/2021) 03/07/2021: Patient receives from [facility] patient assistance program   ? ?No facility-administered encounter medications on file as of 06/01/2021.  ? ? ?Patient Active Problem List  ? Diagnosis Date Noted  ? 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 Hyperlipidemia associated with type 2 diabetes mellitus ([place]) 12/05/2019  ? Venous stasis 09/02/2017  ? Morbid obesity ([place]) 12/06/2015  ? Pedal edema 08/11/2013  ? Lumbar pain 08/11/2013  ? Essential hypertension, benign 05/30/2012  ? CERVICAL RADICULITIS 09/15/2009  ? ? ?Conditions to be addressed/monitored:HTN and DMII ? ?Care Plan : RN Care Manager plan of care  ?Updates made by Kassie Mends, RN since 06/01/2021 12:00 AM  ?  ? ?Problem: No plan of care established for management of chronic disease states (DM2, HTN)   ?Priority: High  ?  ? ?Long-Range Goal: Development of plan of care for chronic disease management (DM2, HTN)   ?  Start Date: 01/12/2021  ?Expected End Date: 11/28/2021  ?Priority: High  ?Note:   ?Current Barriers:  ?Knowledge Deficits related to plan of care for management of HTN and DMII  ?Chronic Disease Management support and education needs related to HTN and DMII ?Knowledge Deficits related to basic Diabetes pathophysiology and self care/management- pt reports she lives with  her spouse and she is independent with all aspects of her care, pt reports she received advanced directives packet but has not completed yet. ?Knowledge Deficits related to medications used for management of diabetes-  Pt reports she has completed paperwork for assistance and approved for help for rybelsus (with assistance of CCM pharmacist), reports has all medications and taking as prescribed. ?Does not use cbg meter- pt reports reports most recent CBG was 97 and 100 fasting, random readings in 140's range ?Does not adhere to provider recommendations- does not always eat carbohydrate modified/ low sodium diet ?Does not contact provider office for questions/concerns ?Knowledge Deficits related to basic understanding of hypertension pathophysiology and self care management- pt reports she does not check blood pressure often. ? ?RNCM Clinical Goal(s):  ?Patient will verbalize understanding of plan for management of HTN and DMII as evidenced by patient report, review of EHR ?take all medications exactly as prescribed and will call provider for medication related questions as evidenced by patient report, review of EHR    ?attend all scheduled medical appointments: 09/02/21 primary care provider as evidenced by patient report, review of EHR and        through collaboration with RN Care manager, provider, and care team.  ? ?Interventions: ?1:1 collaboration with primary care provider regarding development and update of comprehensive plan of care as evidenced by provider attestation and co-signature ?Inter-disciplinary care team collaboration (see longitudinal plan of care) ?Evaluation of current treatment plan related to  self management and patient's adherence to plan as established by provider ? ?Hypertension Interventions: ?Last practice recorded BP readings:  ?BP Readings from Last 3 Encounters:  ?10/30/20 (!) 142/72  ?06/01/20 124/70  ?02/13/20 138/70  ?Most recent eGFR/CrCl:  ?Lab Results  ?Component Value Date  ?  EGFR 54 (L) 11/25/2020  ?  No components found for: CRCL ? ?Evaluation of current treatment plan related to hypertension self management and patient's adherence to plan as established by provider ?Reviewed medications with patient and discussed importance of compliance ?Counseled on the importance of exercise goals with target of 150 minutes per week ?Advised patient, providing education and rationale, to monitor blood pressure daily and record, calling PCP for findings outside established parameters ?Reinforced low sodium diet ?Reinforced with patient to check blood pressure at least 3 x per week ?Reviewed all upcoming scheduled appointments ?Pain assessment completed ? ?Diabetes Interventions: ?Assessed patient's understanding of A1c goal: <7% ?Reviewed medications with patient and discussed importance of medication adherence ?Reviewed scheduled/upcoming provider appointments including:   09/02/21 primary care provider ?Review of patient status, including review of consultants reports, relevant laboratory and other test results, and medications completed ?Reinforced carbohydrate modified diet and food choices, portion control ?Reviewed importance of checking blood sugar as prescribed ? ?Lab Results  ?Component Value Date  ? HGBA1C 6.4 (H) 11/25/2020  ?  ? ?Patient Goals/Self-Care Activities: ?Take medications as prescribed   ?Attend all scheduled provider appointments ?Perform all self care activities independently  ?Perform IADL's (shopping, preparing meals, housekeeping, managing finances) independently ?Call provider office for new concerns or questions  ?check blood sugar at prescribed times: twice daily ?enter blood sugar readings and medication  or insulin into daily log ?take the blood sugar log to all doctor visits ?take the blood sugar meter to all doctor visits ?fill half of plate with vegetables ?read food labels for fat, fiber, carbohydrates and portion size ?keep feet up while sitting ?wash and dry feet  carefully every day ?wear comfortable, cotton socks ?wear comfortable, well-fitting shoes ?check blood pressure 3 times per week ?write blood pressure results in a log or diary ?take blood pressure log

## 2021-06-08 ENCOUNTER — Telehealth: Payer: Self-pay

## 2021-06-08 NOTE — Telephone Encounter (Signed)
Nurses-I am uncertain of how this gets done.  This was under Chris's program not under my program. ?Please connect with chronic care management to find out how they can make sure her medicine is reordered.  She will need refills to last her through July. ?

## 2021-06-08 NOTE — Telephone Encounter (Signed)
Please advise. Thank you

## 2021-06-08 NOTE — Telephone Encounter (Signed)
Caller name:Alexea Kinbrae  ? ?On DPR?;No  ? ?Call back number:[phone] ? ?Provider they see: Wolfgang Phoenix  ? ?Reason for call:Pt was seeing Gerald Stabs out pharmacist and he had medication for Semaglutide (RYBELSUS) 7 MG TABS sent here to the office to pick up there was something the pharmacist sent through so she would not have to pay for RX she is down to one pill what does she need to do or who will she need to go through?  ? ?

## 2021-06-08 NOTE — Telephone Encounter (Signed)
Message sent to Jacqlyn Larsen, CCM ?

## 2021-06-09 ENCOUNTER — Ambulatory Visit: Payer: Self-pay | Admitting: *Deleted

## 2021-06-09 ENCOUNTER — Other Ambulatory Visit (HOSPITAL_COMMUNITY): Payer: Self-pay

## 2021-06-09 ENCOUNTER — Telehealth: Payer: Self-pay | Admitting: Family Medicine

## 2021-06-09 DIAGNOSIS — I10 Essential (primary) hypertension: Secondary | ICD-10-CM

## 2021-06-09 DIAGNOSIS — N1831 Chronic kidney disease, stage 3a: Secondary | ICD-10-CM

## 2021-06-09 MED ORDER — RYBELSUS 7 MG PO TABS: 1.0000 | ORAL_TABLET | Freq: Every day | ORAL | 0 refills | Status: DC

## 2021-06-09 NOTE — Telephone Encounter (Signed)
[name] stated patient has approval for this medication through November 2023 and needs to call [phone] to check when she will receive the next fill and they should be able to set up auto refill for her. [name] stated she will contact patient and give her the number and explain the process. ?

## 2021-06-09 NOTE — Chronic Care Management (AMB) (Signed)
?Chronic Care Management  ? ?CCM RN Visit Note ? ?06/09/2021 ?Name: [name] MRN: [id number] DOB: 06/05/1950 ? ?Subjective: ?[name] is a 71 y.o. year old female who is a primary care patient of Luking, Elayne Snare, MD. The care management team was consulted for assistance with disease management and care coordination needs.   ? ?Engaged with patient by telephone for follow up visit in response to provider referral for case management and/or care coordination services.  ? ?Consent to Services:  ?The patient was given information about Chronic Care Management services, agreed to services, and gave verbal consent prior to initiation of services.  Please see initial visit note for detailed documentation.  ? ?Patient agreed to services and verbal consent obtained.  ? ?Assessment: Review of patient past medical history, allergies, medications, health status, including review of consultants reports, laboratory and other test data, was performed as part of comprehensive evaluation and provision of chronic care management services.  ? ?SDOH (Social Determinants of Health) assessments and interventions performed:   ? ?CCM Care Plan ? ?Allergies  ?Allergen Reactions  ? Wilder Glade [Dapagliflozin] Other (See Comments)  ?  Frequent urination and excessive thirst  ? ? ?Outpatient Encounter Medications as of 06/09/2021  ?Medication Sig Note  ? acetaminophen (TYLENOL) 500 MG tablet Take 1,000 mg by mouth every 6 (six) hours as needed for moderate pain or headache.   ? blood glucose meter kit and supplies Dispense based on patient and insurance preference. Use to check blood sugars once daily . (FOR ICD-10 E10.9, E11.9).   ? chlorhexidine (PERIDEX) 0.12 % solution FOR OCCASIONAL USE, AFTER BRUSHING, RINSE WITH ONE CAPFUL FOR ONE MINUTE TWICE A DAY THEN SPIT OUT   ? glucose blood (ACCU-CHEK GUIDE) test strip TEST BLOOD SUGAR ONCE DAILY (E11.9)   ? irbesartan (AVAPRO) 150 MG tablet Take 1 tablet (150 mg total) by mouth daily.    ? meclizine (ANTIVERT) 25 MG tablet Take 1 tablet (25 mg total) by mouth 3 (three) times daily as needed for dizziness.   ? Polyethyl Glycol-Propyl Glycol (SYSTANE OP) Place 1 drop into both eyes daily as needed (dry eyes).   ? rosuvastatin (CRESTOR) 40 MG tablet Take 1 tablet (40 mg total) by mouth daily.   ? Semaglutide (RYBELSUS) 3 MG TABS Take 1 tablet by mouth daily before breakfast. (Patient not taking: Reported on 06/01/2021) 03/07/2021: Patient receives from [facility] patient assistance program   ? Semaglutide (RYBELSUS) 7 MG TABS Take 1 tablet by mouth daily before breakfast. 03/07/2021: Patient receives from [facility] patient assistance program  ?  ? ?No facility-administered encounter medications on file as of 06/09/2021.  ? ? ?Patient Active Problem List  ? Diagnosis Date Noted  ? 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 Hyperlipidemia associated with type 2 diabetes mellitus ([place]) 12/05/2019  ? Venous stasis 09/02/2017  ? Morbid obesity ([place]) 12/06/2015  ? Pedal edema 08/11/2013  ? Lumbar pain 08/11/2013  ? Essential hypertension, benign 05/30/2012  ? CERVICAL RADICULITIS 09/15/2009  ? ? ?Conditions to be addressed/monitored:HTN and DMII ? ?Care Plan : RN Care Manager plan of care  ?Updates made by Kassie Mends, RN since 06/09/2021 12:00 AM  ?  ? ?Problem: No plan of care established for management of chronic disease states (DM2, HTN)   ?Priority: High  ?  ? ?Long-Range Goal: Development of plan of care for chronic disease management (DM2, HTN)   ?  Start Date: 01/12/2021  ?Expected End Date: 11/28/2021  ?Priority: High  ?Note:   ?Current Barriers:  ?Knowledge Deficits related to plan of care for management of HTN and DMII  ?Chronic Disease Management support and education needs related to HTN and DMII ?Knowledge Deficits related to basic Diabetes pathophysiology and self care/management- pt reports she lives  with her spouse and she is independent with all aspects of her care, pt reports she received advanced directives packet but has not completed yet. ?Knowledge Deficits related to medications used for management of diabetes-  Pt reports she has completed paperwork for assistance and approved for help for rybelsus (with assistance of CCM pharmacist), reports has all medications and taking as prescribed. ?Does not use cbg meter- pt reports reports most recent CBG was 97 and 100 fasting, random readings in 140's range ?Does not adhere to provider recommendations- does not always eat carbohydrate modified/ low sodium diet ?Does not contact provider office for questions/concerns ?Knowledge Deficits related to basic understanding of hypertension pathophysiology and self care management- pt reports she does not check blood pressure often. ?06/09/21- pt called and left voicemail stating she is taking her last Rybelsus medication today and asks how does she get refills (pharmacist at her primary care provider office is no longer there), pt also called into her primary care provider yesterday and today requesting assistance. ? ?RNCM Clinical Goal(s):  ?Patient will verbalize understanding of plan for management of HTN and DMII as evidenced by patient report, review of EHR ?take all medications exactly as prescribed and will call provider for medication related questions as evidenced by patient report, review of EHR    ?attend all scheduled medical appointments: 09/02/21 primary care provider as evidenced by patient report, review of EHR and        through collaboration with RN Care manager, provider, and care team.  ? ?Interventions: ?1:1 collaboration with primary care provider regarding development and update of comprehensive plan of care as evidenced by provider attestation and co-signature ?Inter-disciplinary care team collaboration (see longitudinal plan of care) ?Evaluation of current treatment plan related to  self  management and patient's adherence to plan as established by provider ? ?Hypertension Interventions: ?Last practice recorded BP readings:  ?BP Readings from Last 3 Encounters:  ?10/30/20 (!) 142/72  ?06/01/20 124/70  ?02/13/20 138/70  ?Most recent eGFR/CrCl:  ?Lab Results  ?Component Value Date  ? EGFR 54 (L) 11/25/2020  ?  No components found for: CRCL ? ?Evaluation of current treatment plan related to hypertension self management and patient's adherence to plan as established by provider ?Reviewed medications with patient and discussed importance of compliance ?Counseled on the importance of exercise goals with target of 150 minutes per week ?Advised patient, providing education and rationale, to monitor blood pressure daily and record, calling PCP for findings outside established parameters ?Reinforced low sodium diet ?Reinforced with patient to check blood pressure at least 3 x per week ?Reviewed all upcoming scheduled appointments ?Pain assessment completed ? ?Diabetes Interventions: ?Assessed patient's understanding of A1c goal: <7% ?Reviewed medications with patient and discussed importance of medication adherence ?Reviewed scheduled/upcoming provider appointments including:   09/02/21 primary care provider ?Review of patient status, including review of consultants reports, relevant laboratory and other test results, and medications completed ?Reinforced carbohydrate modified diet and food choices, portion control ?Reviewed importance of checking blood sugar as prescribed ?Email sent to assistant director and Theda Sers (pharmacist) asking for direction with Rybelsus, per Theda Sers, pt has been approved for Rybelsus and  needs to call [phone] to check about when she will receive next fill and also be set up on auto-refill, RN care manager let patient know this, pt verbalizes understanding. ? ?Lab Results  ?Component Value Date  ? HGBA1C 6.4 (H) 11/25/2020  ?  ? ?Patient Goals/Self-Care  Activities: ?Take medications as prescribed   ?Attend all scheduled provider appointments ?Perform all self care activities independently  ?Perform IADL's (shopping, preparing meals, housekeeping, managing finances) inde

## 2021-06-09 NOTE — Telephone Encounter (Signed)
Patient is needing a prescription for semaglutide 7 mg to be sent to [facility]. She states [name] was helping her with this medication where it wouldn't cost so much now she has a voucher she can use and it need to been setup on automic refills when she runs out . Please advise ?

## 2021-06-09 NOTE — Patient Instructions (Signed)
Visit Information ? ?Thank you for taking time to visit with me today. Please don't hesitate to contact me if I can be of assistance to you before our next scheduled telephone appointment. ? ?Following are the goals we discussed today:  ?Please call [phone] and ask when your next fill is for Rybelsus and ask to be set up on auto-refill ? ?Our next appointment is by telephone on 08/24/21 at 1045 am ? ?Please call the care guide team at [phone] if you need to cancel or reschedule your appointment.  ? ?If you are experiencing a Mental Health or [place] or need someone to talk to, please call the Suicide and Crisis Lifeline: 988 ?call the [place] National Suicide Prevention Lifeline: 570-763-9959 or TTY: (763)183-1221 TTY 971-366-9672) to talk to a trained counselor ?call 1-800-273-TALK (toll free, 24 hour hotline) ?go to [facility] [address] [place] [phone]) ?call the [facility]: [phone] ?call 911  ? ?Patient verbalizes understanding of instructions and care plan provided today and agrees to view in [place]. Active MyChart status confirmed with patient.   ? ?Jacqlyn Larsen RNC, BSN ?RN Case Manager ?[place] ?[phone] ? ?

## 2021-06-09 NOTE — Telephone Encounter (Signed)
Prescription sent electronically to [facility]. Patient notified. ?

## 2021-06-10 DIAGNOSIS — Z20822 Contact with and (suspected) exposure to covid-19: Secondary | ICD-10-CM | POA: Diagnosis not present

## 2021-06-17 ENCOUNTER — Telehealth: Payer: Medicare Other

## 2021-06-17 DIAGNOSIS — Z20822 Contact with and (suspected) exposure to covid-19: Secondary | ICD-10-CM | POA: Diagnosis not present

## 2021-06-25 ENCOUNTER — Other Ambulatory Visit: Payer: Self-pay | Admitting: Family Medicine

## 2021-06-26 DIAGNOSIS — N1831 Chronic kidney disease, stage 3a: Secondary | ICD-10-CM | POA: Diagnosis not present

## 2021-06-26 DIAGNOSIS — E0822 Diabetes mellitus due to underlying condition with diabetic chronic kidney disease: Secondary | ICD-10-CM | POA: Diagnosis not present

## 2021-06-26 DIAGNOSIS — I10 Essential (primary) hypertension: Secondary | ICD-10-CM | POA: Diagnosis not present

## 2021-06-27 DIAGNOSIS — Z20822 Contact with and (suspected) exposure to covid-19: Secondary | ICD-10-CM | POA: Diagnosis not present

## 2021-08-23 ENCOUNTER — Telehealth: Payer: Self-pay | Admitting: Family Medicine

## 2021-08-24 ENCOUNTER — Ambulatory Visit (INDEPENDENT_AMBULATORY_CARE_PROVIDER_SITE_OTHER): Payer: Medicare Other | Admitting: *Deleted

## 2021-08-24 DIAGNOSIS — E785 Hyperlipidemia, unspecified: Secondary | ICD-10-CM | POA: Diagnosis not present

## 2021-08-24 DIAGNOSIS — N1831 Chronic kidney disease, stage 3a: Secondary | ICD-10-CM

## 2021-08-24 DIAGNOSIS — E1169 Type 2 diabetes mellitus with other specified complication: Secondary | ICD-10-CM | POA: Diagnosis not present

## 2021-08-24 DIAGNOSIS — I10 Essential (primary) hypertension: Secondary | ICD-10-CM | POA: Diagnosis not present

## 2021-08-24 DIAGNOSIS — E0822 Diabetes mellitus due to underlying condition with diabetic chronic kidney disease: Secondary | ICD-10-CM | POA: Diagnosis not present

## 2021-08-24 NOTE — Chronic Care Management (AMB) (Signed)
Chronic Care Management   CCM RN Visit Note  08/24/2021 Name: [name] MRN: [id number] DOB: 02/22/1951  Subjective: [name] is a 71 y.o. year old female who is a primary care patient of Waters, Emily A, MD. The care management team was consulted for assistance with disease management and care coordination needs.    Engaged with patient by telephone for follow up visit in response to provider referral for case management and/or care coordination services.   Consent to Services:  The patient was given information about Chronic Care Management services, agreed to services, and gave verbal consent prior to initiation of services.  Please see initial visit note for detailed documentation.   Patient agreed to services and verbal consent obtained.   Assessment: Review of patient past medical history, allergies, medications, health status, including review of consultants reports, laboratory and other test data, was performed as part of comprehensive evaluation and provision of chronic care management services.   SDOH (Social Determinants of Health) assessments and interventions performed:    CCM Care Plan  Allergies  Allergen Reactions   Farxiga [Dapagliflozin] Other (See Comments)    Frequent urination and excessive thirst    Outpatient Encounter Medications as of 08/24/2021  Medication Sig   ACCU-CHEK GUIDE test strip TEST BLOOD SUGAR ONCE DAILY (E11.9)   acetaminophen (TYLENOL) 500 MG tablet Take 1,000 mg by mouth every 6 (six) hours as needed for moderate pain or headache.   blood glucose meter kit and supplies Dispense based on patient and insurance preference. Use to check blood sugars once daily . (FOR ICD-10 E10.9, E11.9).   chlorhexidine (PERIDEX) 0.12 % solution FOR OCCASIONAL USE, AFTER BRUSHING, RINSE WITH ONE CAPFUL FOR ONE MINUTE TWICE A DAY THEN SPIT OUT   irbesartan (AVAPRO) 150 MG tablet Take 1 tablet (150 mg total) by mouth daily.   meclizine (ANTIVERT) 25  MG tablet Take 1 tablet (25 mg total) by mouth 3 (three) times daily as needed for dizziness.   Polyethyl Glycol-Propyl Glycol (SYSTANE OP) Place 1 drop into both eyes daily as needed (dry eyes).   rosuvastatin (CRESTOR) 40 MG tablet Take 1 tablet (40 mg total) by mouth daily.   Semaglutide (RYBELSUS) 7 MG TABS Take 1 tablet by mouth daily before breakfast.   No facility-administered encounter medications on file as of 08/24/2021.    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, without long-term current use of insulin (HCC) 12/05/2019   Hyperlipidemia associated with type 2 diabetes mellitus (HCC) 12/05/2019   Venous stasis 09/02/2017   Morbid obesity (HCC) 12/06/2015   Pedal edema 08/11/2013   Lumbar pain 08/11/2013   Essential hypertension, benign 05/30/2012   CERVICAL RADICULITIS 09/15/2009    Conditions to be addressed/monitored:HTN and DMII  Care Plan : RN Care Manager plan of care  Updates made by Farmer, Julie A, RN since 08/24/2021 12:00 AM  Completed 08/24/2021   Problem: No plan of care established for management of chronic disease states (DM2, HTN) Resolved 08/24/2021  Priority: High     Long-Range Goal: Development of plan of care for chronic disease management (DM2, HTN) Completed 08/24/2021  Start Date: 01/12/2021  Expected End Date: 11/28/2021  Priority: High  Note:   Current Barriers:  Knowledge Deficits related to plan of care for management of HTN and DMII  Chronic Disease Management support and education needs related to HTN and DMII Knowledge Deficits related   to basic Diabetes pathophysiology and self care/management- pt reports she lives with her spouse and she is independent with all aspects of her care, pt reports she received advanced directives packet but has not completed yet. Knowledge Deficits related to medications used for management of diabetes-  Pt reports she  has completed paperwork for assistance and approved for help for rybelsus (with assistance of CCM pharmacist), reports has all medications and taking as prescribed. Does is checking CBG- pt reports most recent CBG in 90's range with the highest fasting reading 103 Does not adhere to provider recommendations- does not always eat carbohydrate modified/ low sodium diet Does not contact provider office for questions/concerns Knowledge Deficits related to basic understanding of hypertension pathophysiology and self care management- pt reports she does not check blood pressure often. 06/09/21- pt called and left voicemail stating she is taking her last Rybelsus medication today and asks how does she get refills (pharmacist at her primary care provider office is no longer there), pt also called into her primary care provider yesterday and today requesting assistance. 08/24/21- patient reports no new issues or concerns, has all medications including Rybelsus and taking as prescibed, pt states she called the number given for assistance with Rybelsus.  RNCM Clinical Goal(s):  Patient will verbalize understanding of plan for management of HTN and DMII as evidenced by patient report, review of EHR take all medications exactly as prescribed and will call provider for medication related questions as evidenced by patient report, review of EHR    attend all scheduled medical appointments: 09/02/21 primary care provider as evidenced by patient report, review of EHR and        through collaboration with RN Care manager, provider, and care team.   Interventions: 1:1 collaboration with primary care provider regarding development and update of comprehensive plan of care as evidenced by provider attestation and co-signature Inter-disciplinary care team collaboration (see longitudinal plan of care) Evaluation of current treatment plan related to  self management and patient's adherence to plan as established by  provider  Hypertension Interventions: Last practice recorded BP readings:  BP Readings from Last 3 Encounters:  10/30/20 (!) 142/72  06/01/20 124/70  02/13/20 138/70  Most recent eGFR/CrCl:  Lab Results  Component Value Date   EGFR 54 (L) 11/25/2020    No components found for: CRCL  Evaluation of current treatment plan related to hypertension self management and patient's adherence to plan as established by provider Reviewed medications with patient and discussed importance of compliance Counseled on the importance of exercise goals with target of 150 minutes per week Advised patient, providing education and rationale, to monitor blood pressure daily and record, calling PCP for findings outside established parameters Reviewed low sodium diet Reviewed with patient to check blood pressure at least 3 x per week Reviewed all upcoming scheduled appointments Pain assessment completed  Diabetes Interventions: Assessed patient's understanding of A1c goal: <7% Reviewed medications with patient and discussed importance of medication adherence Reviewed scheduled/upcoming provider appointments including:   primary care provider Review of patient status, including review of consultants reports, relevant laboratory and other test results, and medications completed Reinforced carbohydrate modified diet and food choices, portion control Reviewed importance of checking blood sugar as prescribed Confirmed patient called Rybelsus at [phone] to check on assistance and is able to call going forward Reviewed plan of care and goals met, case closure today, pt verbalizes understanding  Lab Results  Component Value Date   HGBA1C 6.4 (H) 11/25/2020  Patient Goals/Self-Care Activities: Take medications as prescribed   Attend all scheduled provider appointments Call pharmacy for medication refills 3-7 days in advance of running out of medications Attend church or other social  activities Perform all self care activities independently  Perform IADL's (shopping, preparing meals, housekeeping, managing finances) independently Call provider office for new concerns or questions  check blood sugar at prescribed times: twice daily enter blood sugar readings and medication or insulin into daily log take the blood sugar log to all doctor visits take the blood sugar meter to all doctor visits fill half of plate with vegetables limit fast food meals to no more than 1 per week manage portion size read food labels for fat, fiber, carbohydrates and portion size keep feet up while sitting wash and dry feet carefully every day wear comfortable, cotton socks wear comfortable, well-fitting shoes check blood pressure 3 times per week write blood pressure results in a log or diary take blood pressure log to all doctor appointments call doctor for signs and symptoms of high blood pressure keep all doctor appointments take medications for blood pressure exactly as prescribed eat more whole grains, fruits and vegetables, lean meats and healthy fats If you need assistance completing advanced directives please call RN care manager at [phone] Follow low sodium diet- read food labels- avoid fast food, salty snacks Practice good handwashing Try to do some type of exercise daily, walking is good Please call [phone] for any questions about assistance for Rybelsus Case closure today        Plan:No further follow up required: Case closure  Julie Farmer RNC, BSN RN Case Manager [facility] [phone]          

## 2021-08-24 NOTE — Patient Instructions (Signed)
Visit Information  Thank you for taking time to visit with me today. Please don't hesitate to contact me if I can be of assistance to you before our next scheduled telephone appointment.  Following are the goals we discussed today:  Take medications as prescribed   Attend all scheduled provider appointments Call pharmacy for medication refills 3-7 days in advance of running out of medications Attend church or other social activities Perform all self care activities independently  Perform IADL's (shopping, preparing meals, housekeeping, managing finances) independently Call provider office for new concerns or questions  check blood sugar at prescribed times: twice daily enter blood sugar readings and medication or insulin into daily log take the blood sugar log to all doctor visits take the blood sugar meter to all doctor visits fill half of plate with vegetables limit fast food meals to no more than 1 per week manage portion size read food labels for fat, fiber, carbohydrates and portion size keep feet up while sitting wash and dry feet carefully every day wear comfortable, cotton socks wear comfortable, well-fitting shoes check blood pressure 3 times per week write blood pressure results in a log or diary take blood pressure log to all doctor appointments call doctor for signs and symptoms of high blood pressure keep all doctor appointments take medications for blood pressure exactly as prescribed eat more whole grains, fruits and vegetables, lean meats and healthy fats If you need assistance completing advanced directives please call RN care manager at [phone] Follow low sodium diet- read food labels- avoid fast food, salty snacks Practice good handwashing Try to do some type of exercise daily, walking is good Please call [phone] for any questions about assistance for Rybelsus Case closure today  Our next appointment is - no follow up needed,  case closed  Please  call the care guide team at [phone] if you need to cancel or reschedule your appointment.   If you are experiencing a Mental Health or [place] or need someone to talk to, please call the Suicide and Crisis Lifeline: 988 call the [place] National Suicide Prevention Lifeline: 734-666-0237 or TTY: (220)124-5971 TTY (305)876-5364) to talk to a trained counselor call 1-800-273-TALK (toll free, 24 hour hotline) go to [facility] [address], [place] [phone]) call the [place]: [phone] call 911   Patient verbalizes understanding of instructions and care plan provided today and agrees to view in [place]. Active MyChart status and patient understanding of how to access instructions and care plan via MyChart confirmed with patient.     Jacqlyn Larsen [facility], BSN RN Case Manager [facility] [phone]

## 2021-08-25 LAB — HEPATIC FUNCTION PANEL
ALT: 21 IU/L (ref 0–32)
AST: 19 IU/L (ref 0–40)
Albumin: 4.2 g/dL (ref 3.7–4.7)
Alkaline Phosphatase: 132 IU/L — ABNORMAL HIGH (ref 44–121)
Bilirubin Total: 0.5 mg/dL (ref 0.0–1.2)
Bilirubin, Direct: 0.15 mg/dL (ref 0.00–0.40)
Total Protein: 7 g/dL (ref 6.0–8.5)

## 2021-08-25 LAB — LIPID PANEL
Chol/HDL Ratio: 2.5 ratio (ref 0.0–4.4)
Cholesterol, Total: 117 mg/dL (ref 100–199)
HDL: 46 mg/dL (ref >39)
LDL Chol Calc (NIH): 57 mg/dL (ref 0–99)
Triglycerides: 66 mg/dL (ref 0–149)
VLDL Cholesterol Cal: 14 mg/dL (ref 5–40)

## 2021-08-25 LAB — BASIC METABOLIC PANEL
BUN/Creatinine Ratio: 13 (ref 12–28)
BUN: 16 mg/dL (ref 8–27)
CO2: 20 mmol/L (ref 20–29)
Calcium: 9.3 mg/dL (ref 8.7–10.3)
Chloride: 108 mmol/L — ABNORMAL HIGH (ref 96–106)
Creatinine, Ser: 1.26 mg/dL — ABNORMAL HIGH (ref 0.57–1.00)
Glucose: 107 mg/dL — ABNORMAL HIGH (ref 70–99)
Potassium: 3.9 mmol/L (ref 3.5–5.2)
Sodium: 141 mmol/L (ref 134–144)
eGFR: 46 mL/min/{1.73_m2} — ABNORMAL LOW (ref >59)

## 2021-08-25 LAB — HEMOGLOBIN A1C
Est. average glucose Bld gHb Est-mCnc: 128 mg/dL
Hgb A1c MFr Bld: 6.1 % — ABNORMAL HIGH (ref 4.8–5.6)

## 2021-08-26 DIAGNOSIS — I10 Essential (primary) hypertension: Secondary | ICD-10-CM

## 2021-08-26 DIAGNOSIS — E1159 Type 2 diabetes mellitus with other circulatory complications: Secondary | ICD-10-CM

## 2021-09-02 ENCOUNTER — Ambulatory Visit (INDEPENDENT_AMBULATORY_CARE_PROVIDER_SITE_OTHER): Payer: Medicare Other | Admitting: Family Medicine

## 2021-09-02 ENCOUNTER — Encounter: Payer: Self-pay | Admitting: Family Medicine

## 2021-09-02 VITALS — BP 132/78 | Wt 197.6 lb

## 2021-09-02 DIAGNOSIS — M791 Myalgia, unspecified site: Secondary | ICD-10-CM

## 2021-09-02 DIAGNOSIS — M79605 Pain in left leg: Secondary | ICD-10-CM | POA: Diagnosis not present

## 2021-09-02 DIAGNOSIS — I10 Essential (primary) hypertension: Secondary | ICD-10-CM

## 2021-09-02 DIAGNOSIS — E785 Hyperlipidemia, unspecified: Secondary | ICD-10-CM | POA: Diagnosis not present

## 2021-09-02 DIAGNOSIS — E0822 Diabetes mellitus due to underlying condition with diabetic chronic kidney disease: Secondary | ICD-10-CM

## 2021-09-02 DIAGNOSIS — E1169 Type 2 diabetes mellitus with other specified complication: Secondary | ICD-10-CM | POA: Diagnosis not present

## 2021-09-02 DIAGNOSIS — N1831 Chronic kidney disease, stage 3a: Secondary | ICD-10-CM | POA: Diagnosis not present

## 2021-09-02 NOTE — Progress Notes (Signed)
   Subjective:    Patient ID: [name], female    DOB: [birth year], 71 y.o.   MRN: [id number]  Diabetes She presents for her follow-up diabetic visit. She has type 2 diabetes mellitus. There are no hypoglycemic associated symptoms. There are no diabetic associated symptoms. There are no hypoglycemic complications. There are no diabetic complications. Risk factors for coronary artery disease include hypertension. Home blood sugar record trend: 103 has been highest blood sugar. She does not see a podiatrist.Eye exam is current.  Hypertension This is a chronic problem. Risk factors for coronary artery disease include diabetes mellitus.    Pt having issues with leg muscle cramps. Began about one month ago. Worse at night. Left leg is worse that right leg. Stiffness when getting up from seated position and hard time getting in SUV. She describes this as a aching sensation in the left lateral portion of her calf down toward the foot denies any buttock pain or back pain denies any true cramping just states it aches   Review of Systems     Objective:   Physical Exam  General-in no acute distress Eyes-no discharge Lungs-respiratory rate normal, CTA CV-no murmurs,RRR Extremities skin warm dry no edema Neuro grossly normal Behavior normal, alert       Assessment & Plan:  1. Essential hypertension, benign Blood pressure good continue current measures - CK - C-reactive protein  2. Diabetes mellitus due to underlying condition with stage 3a chronic kidney disease, without long-term current use of insulin ([place]) Diabetes under good control healthy diet recommended kidney function stable sees nephrology on a yearly basis - CK - C-reactive protein  3. Hyperlipidemia associated with type 2 diabetes mellitus ([place]) Continue cholesterol medication watch diet closely - CK - C-reactive protein  4. Myalgia With muscle discomfort in the leg it is hard to know if this is a orthopedic issue  or potentially nerve impingement in her back we will check a muscle enzyme test because she is on statins - CK - C-reactive protein  5. Pain of left lower extremity We need to make sure that she is not developing a circulation issue ABI ordered.  If ABI shows significant circulation issue will need more advanced testing - CK - C-reactive protein - US ARTERIAL ABI (SCREENING LOWER EXTREMITY) If these blood work and circulation test does not reveal the problem strongly consider orthopedics for consultation for possible neuropathic pain versus orthopedic/musculoskeletal  Follow-up by late fall

## 2021-09-03 ENCOUNTER — Other Ambulatory Visit: Payer: Self-pay | Admitting: Family Medicine

## 2021-09-06 DIAGNOSIS — E0822 Diabetes mellitus due to underlying condition with diabetic chronic kidney disease: Secondary | ICD-10-CM | POA: Diagnosis not present

## 2021-09-06 DIAGNOSIS — M791 Myalgia, unspecified site: Secondary | ICD-10-CM | POA: Diagnosis not present

## 2021-09-06 DIAGNOSIS — E785 Hyperlipidemia, unspecified: Secondary | ICD-10-CM | POA: Diagnosis not present

## 2021-09-06 DIAGNOSIS — E1169 Type 2 diabetes mellitus with other specified complication: Secondary | ICD-10-CM | POA: Diagnosis not present

## 2021-09-06 DIAGNOSIS — M79605 Pain in left leg: Secondary | ICD-10-CM | POA: Diagnosis not present

## 2021-09-06 DIAGNOSIS — N1831 Chronic kidney disease, stage 3a: Secondary | ICD-10-CM | POA: Diagnosis not present

## 2021-09-06 DIAGNOSIS — I10 Essential (primary) hypertension: Secondary | ICD-10-CM | POA: Diagnosis not present

## 2021-09-07 LAB — CK: Total CK: 112 U/L (ref 32–182)

## 2021-09-07 LAB — C-REACTIVE PROTEIN: CRP: 2 mg/L (ref 0–10)

## 2021-09-09 ENCOUNTER — Ambulatory Visit (HOSPITAL_COMMUNITY)
Admission: RE | Admit: 2021-09-09 | Discharge: 2021-09-09 | Disposition: A | Discharge status: Home / Self Care | Payer: Medicare Other | Source: Ambulatory Visit | Attending: Family Medicine | Admitting: Family Medicine

## 2021-09-09 ENCOUNTER — Other Ambulatory Visit (HOSPITAL_COMMUNITY): Payer: Self-pay | Admitting: Family Medicine

## 2021-09-09 DIAGNOSIS — M79605 Pain in left leg: Secondary | ICD-10-CM | POA: Diagnosis not present

## 2021-09-09 DIAGNOSIS — Z1231 Encounter for screening mammogram for malignant neoplasm of breast: Secondary | ICD-10-CM

## 2021-09-09 DIAGNOSIS — M79604 Pain in right leg: Secondary | ICD-10-CM | POA: Diagnosis not present

## 2021-09-12 ENCOUNTER — Ambulatory Visit (HOSPITAL_COMMUNITY)
Admission: RE | Admit: 2021-09-12 | Discharge: 2021-09-12 | Disposition: A | Discharge status: Home / Self Care | Payer: Medicare Other | Source: Ambulatory Visit | Attending: Family Medicine | Admitting: Family Medicine

## 2021-09-12 DIAGNOSIS — Z1231 Encounter for screening mammogram for malignant neoplasm of breast: Secondary | ICD-10-CM | POA: Insufficient documentation

## 2021-09-25 ENCOUNTER — Other Ambulatory Visit: Payer: Self-pay | Admitting: Family Medicine

## 2021-10-11 ENCOUNTER — Other Ambulatory Visit: Payer: Self-pay | Admitting: Family Medicine

## 2021-10-11 DIAGNOSIS — I10 Essential (primary) hypertension: Secondary | ICD-10-CM

## 2021-12-14 DIAGNOSIS — Z23 Encounter for immunization: Secondary | ICD-10-CM | POA: Diagnosis not present

## 2022-01-12 ENCOUNTER — Telehealth: Payer: Self-pay | Admitting: Family Medicine

## 2022-01-12 NOTE — Telephone Encounter (Signed)
Left message for patient to call back and schedule Medicare Annual Wellness Visit (AWV) in office.   If unable to come into the office for AWV,  please offer to do virtually or by telephone.  Last AWV: 12/28/2020   Please schedule at anytime with [facility] Advisor.  45 minute appointment for Virtual or phone  45 minute appointment for in office or Initial virtual/phone  Any questions, please contact me at [phone]

## 2022-01-30 ENCOUNTER — Telehealth: Payer: Self-pay | Admitting: Family Medicine

## 2022-01-30 DIAGNOSIS — I10 Essential (primary) hypertension: Secondary | ICD-10-CM

## 2022-01-30 DIAGNOSIS — E785 Hyperlipidemia, unspecified: Secondary | ICD-10-CM

## 2022-01-30 DIAGNOSIS — N1831 Chronic kidney disease, stage 3a: Secondary | ICD-10-CM

## 2022-01-30 DIAGNOSIS — Z79899 Other long term (current) drug therapy: Secondary | ICD-10-CM

## 2022-01-30 NOTE — Telephone Encounter (Signed)
Last labs completed 09/06/21-C-Reactive protein and CK; 08/24/21-Hepatic, Lipid, BMET and A1C. Please advise. Thank you

## 2022-01-30 NOTE — Telephone Encounter (Signed)
Patient has appointment on 12/11 and needing labs done.

## 2022-01-30 NOTE — Telephone Encounter (Signed)
Lipid, liver, metabolic 7, I7X, urine ACR HTN diabetes hyperlipidemia

## 2022-01-30 NOTE — Telephone Encounter (Signed)
Blood work ordered in EPIC. Left message to return call  

## 2022-02-01 NOTE — Telephone Encounter (Signed)
Pt returned call and front advised that lab orders have been placed.

## 2022-02-02 DIAGNOSIS — E785 Hyperlipidemia, unspecified: Secondary | ICD-10-CM | POA: Diagnosis not present

## 2022-02-02 DIAGNOSIS — E0822 Diabetes mellitus due to underlying condition with diabetic chronic kidney disease: Secondary | ICD-10-CM | POA: Diagnosis not present

## 2022-02-02 DIAGNOSIS — N1831 Chronic kidney disease, stage 3a: Secondary | ICD-10-CM | POA: Diagnosis not present

## 2022-02-02 DIAGNOSIS — E1169 Type 2 diabetes mellitus with other specified complication: Secondary | ICD-10-CM | POA: Diagnosis not present

## 2022-02-02 DIAGNOSIS — Z79899 Other long term (current) drug therapy: Secondary | ICD-10-CM | POA: Diagnosis not present

## 2022-02-05 LAB — HEPATIC FUNCTION PANEL
ALT: 15 IU/L (ref 0–32)
AST: 21 IU/L (ref 0–40)
Albumin: 4.4 g/dL (ref 3.8–4.8)
Alkaline Phosphatase: 117 IU/L (ref 44–121)
Bilirubin Total: 0.6 mg/dL (ref 0.0–1.2)
Bilirubin, Direct: 0.19 mg/dL (ref 0.00–0.40)
Total Protein: 7.2 g/dL (ref 6.0–8.5)

## 2022-02-05 LAB — LIPID PANEL
Chol/HDL Ratio: 2.8 ratio (ref 0.0–4.4)
Cholesterol, Total: 133 mg/dL (ref 100–199)
HDL: 48 mg/dL (ref >39)
LDL Chol Calc (NIH): 70 mg/dL (ref 0–99)
Triglycerides: 72 mg/dL (ref 0–149)
VLDL Cholesterol Cal: 15 mg/dL (ref 5–40)

## 2022-02-05 LAB — HEMOGLOBIN A1C
Est. average glucose Bld gHb Est-mCnc: 137 mg/dL
Hgb A1c MFr Bld: 6.4 % — ABNORMAL HIGH (ref 4.8–5.6)

## 2022-02-05 LAB — BASIC METABOLIC PANEL
BUN/Creatinine Ratio: 17 (ref 12–28)
BUN: 17 mg/dL (ref 8–27)
CO2: 21 mmol/L (ref 20–29)
Calcium: 9.5 mg/dL (ref 8.7–10.3)
Chloride: 106 mmol/L (ref 96–106)
Creatinine, Ser: 1 mg/dL (ref 0.57–1.00)
Glucose: 100 mg/dL — ABNORMAL HIGH (ref 70–99)
Potassium: 4.1 mmol/L (ref 3.5–5.2)
Sodium: 141 mmol/L (ref 134–144)
eGFR: 60 mL/min/{1.73_m2} (ref >59)

## 2022-02-05 LAB — MICROALBUMIN / CREATININE URINE RATIO
Creatinine, Urine: 157.6 mg/dL
Microalb/Creat Ratio: 126 mg/g creat — ABNORMAL HIGH (ref 0–29)
Microalbumin, Urine: 199.1 ug/mL

## 2022-02-06 ENCOUNTER — Ambulatory Visit (INDEPENDENT_AMBULATORY_CARE_PROVIDER_SITE_OTHER): Payer: Medicare Other | Admitting: Family Medicine

## 2022-02-06 ENCOUNTER — Telehealth: Payer: Self-pay | Admitting: Family Medicine

## 2022-02-06 ENCOUNTER — Encounter: Payer: Self-pay | Admitting: Family Medicine

## 2022-02-06 VITALS — BP 138/81 | Wt 201.2 lb

## 2022-02-06 DIAGNOSIS — N1831 Chronic kidney disease, stage 3a: Secondary | ICD-10-CM

## 2022-02-06 DIAGNOSIS — E0822 Diabetes mellitus due to underlying condition with diabetic chronic kidney disease: Secondary | ICD-10-CM | POA: Diagnosis not present

## 2022-02-06 DIAGNOSIS — I10 Essential (primary) hypertension: Secondary | ICD-10-CM

## 2022-02-06 DIAGNOSIS — E785 Hyperlipidemia, unspecified: Secondary | ICD-10-CM | POA: Diagnosis not present

## 2022-02-06 DIAGNOSIS — E1169 Type 2 diabetes mellitus with other specified complication: Secondary | ICD-10-CM | POA: Diagnosis not present

## 2022-02-06 MED ORDER — IRBESARTAN 150 MG PO TABS: 150.0000 mg | ORAL_TABLET | Freq: Every day | ORAL | 1 refills | Status: DC

## 2022-02-06 MED ORDER — ROSUVASTATIN CALCIUM 40 MG PO TABS: 40.0000 mg | ORAL_TABLET | Freq: Every day | ORAL | 1 refills | Status: DC

## 2022-02-06 NOTE — Telephone Encounter (Signed)
Signature was completed thank you

## 2022-02-06 NOTE — Telephone Encounter (Signed)
Patient assistance form for Rybelsus 7 mg placed in provider office for signature. Please advise. Thank you.

## 2022-02-06 NOTE — Progress Notes (Signed)
Subjective:    Patient ID: [name], female    DOB: 12-28-1950, 71 y.o.   MRN: [id number]  Diabetes She presents for her follow-up diabetic visit. She has type 2 diabetes mellitus. There are no hypoglycemic associated symptoms. There are no diabetic associated symptoms. There are no hypoglycemic complications. There are no diabetic complications. Risk factors for coronary artery disease include diabetes mellitus. She does not see a podiatrist.Eye exam is current.  Hypertension This is a chronic problem. Risk factors for coronary artery disease include diabetes mellitus. There are no compliance problems.    Results for orders placed or performed in visit on 01/30/22  Lipid panel  Result Value Ref Range   Cholesterol, Total 133 100 - 199 mg/dL   Triglycerides 72 0 - 149 mg/dL   HDL 48 >39 mg/dL   VLDL Cholesterol Cal 15 5 - 40 mg/dL   LDL Chol Calc (NIH) 70 0 - 99 mg/dL   Chol/HDL Ratio 2.8 0.0 - 4.4 ratio  Hepatic function panel  Result Value Ref Range   Total Protein 7.2 6.0 - 8.5 g/dL   Albumin 4.4 3.8 - 4.8 g/dL   Bilirubin Total 0.6 0.0 - 1.2 mg/dL   Bilirubin, Direct 0.19 0.00 - 0.40 mg/dL   Alkaline Phosphatase 117 44 - 121 IU/L   AST 21 0 - 40 IU/L   ALT 15 0 - 32 IU/L  Basic metabolic panel  Result Value Ref Range   Glucose 100 (H) 70 - 99 mg/dL   BUN 17 8 - 27 mg/dL   Creatinine, Ser 1.00 0.57 - 1.00 mg/dL   eGFR 60 >59 mL/min/1.73   BUN/Creatinine Ratio 17 12 - 28   Sodium 141 134 - 144 mmol/L   Potassium 4.1 3.5 - 5.2 mmol/L   Chloride 106 96 - 106 mmol/L   CO2 21 20 - 29 mmol/L   Calcium 9.5 8.7 - 10.3 mg/dL  Hemoglobin A1c  Result Value Ref Range   Hgb A1c MFr Bld 6.4 (H) 4.8 - 5.6 %   Est. average glucose Bld gHb Est-mCnc 137 mg/dL  Microalbumin / creatinine urine ratio  Result Value Ref Range   Creatinine, Urine 157.6 Not Estab. mg/dL   Microalbumin, Urine 199.1 Not Estab. ug/mL   Microalb/Creat Ratio 126 (H) 0 - 29 mg/g creat      Review of  Systems     Objective:   Physical Exam General-in no acute distress Eyes-no discharge Lungs-respiratory rate normal, CTA CV-no murmurs,RRR Extremities skin warm dry no edema Neuro grossly normal Behavior normal, alert        Assessment & Plan:  1. Essential hypertension, benign HTN- patient seen for follow-up regarding HTN.   Diet, medication compliance, appropriate labs and refills were completed.   Importance of keeping blood pressure under good control to lessen the risk of complications discussed Regular follow-up visits discussed  - irbesartan (AVAPRO) 150 MG tablet; Take 1 tablet (150 mg total) by mouth daily.  Dispense: 90 tablet; Refill: 1  2. 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 The importance of keeping her A1c at or below 7 range was discussed.  Discussed diet, activity, and medication compliance Emphasized healthy eating primarily with vegetables fruits and if utilizing meats lean meats such as chicken or fish grilled baked broiled Avoid sugary drinks Minimize and avoid processed foods Fit  in regular physical activity preferably 25 to 30 minutes 4 times per week Standard follow-up visit recommended.  Patient aware lack of control and follow-up increases risk of diabetic complications. Regular follow-up visits Yearly ophthalmology Yearly foot exam  Minor proteinuria noted.  Doing well with A1c control on Rybelsus Monitor currently  3. Hyperlipidemia associated with type 2 diabetes mellitus (HCC) Hyperlipidemia-importance of diet, weight control, activity, compliance with medications discussed.   Recent labs reviewed.   Any additional labs or refills ordered.   Importance of keeping under good control discussed. Regular follow-up visits discussed   4. Morbid obesity ([place]) The patient's BMI is  calculated.  The patient does have obesity.  The patient does try to some degree staying active and watching diet.  It is in the vital signs and acknowledged.  It is above the recommended BMI for the patient's height and weight.  The patient has been counseled regarding healthy diet, restricted portions, avoiding excessive carbohydrates/sugary foods, and increase physical activity as health permits.  It is in the patient's best interest to lower the risk of secondary illness including heart disease strokes and cancer by losing weight.  The patient acknowledges this information.

## 2022-02-07 NOTE — Telephone Encounter (Signed)
Form faxed by front staff

## 2022-02-17 ENCOUNTER — Ambulatory Visit (INDEPENDENT_AMBULATORY_CARE_PROVIDER_SITE_OTHER): Payer: Medicare Other

## 2022-02-17 VITALS — Ht 59.0 in | Wt 201.0 lb

## 2022-02-17 DIAGNOSIS — Z0000 Encounter for general adult medical examination without abnormal findings: Secondary | ICD-10-CM | POA: Diagnosis not present

## 2022-02-17 DIAGNOSIS — Z1231 Encounter for screening mammogram for malignant neoplasm of breast: Secondary | ICD-10-CM

## 2022-02-17 DIAGNOSIS — Z780 Asymptomatic menopausal state: Secondary | ICD-10-CM

## 2022-02-17 NOTE — Progress Notes (Signed)
Subjective:   [name] is a 71 y.o. female who presents for Medicare Annual (Subsequent) preventive examination.  I connected with  LAURENASHLEY VIAR on 02/17/22 by a audio enabled telemedicine application and verified that I am speaking with the correct person using two identifiers.  Patient Location: Home  Provider Location: Office/Clinic  I discussed the limitations of evaluation and management by telemedicine. The patient expressed understanding and agreed to proceed.  Review of Systems     Cardiac Risk Factors include: advanced age (>49mn, >>52women);diabetes mellitus;dyslipidemia     Objective:    Today's Vitals   02/17/22 0920  Weight: 201 lb (91.2 kg)  Height: _0  (1.499 m)   Body mass index is 40.6 kg/m.     02/17/2022    9:23 AM 12/28/2020    2:11 PM 12/08/2020    2:25 PM 02/13/2020    7:15 AM 02/11/2020    8:53 AM 01/28/2020   11:15 AM 07/11/2017    7:35 AM  Advanced Directives  Does Patient Have a Medical Advance Directive? _1  No No  Would patient like information on creating a medical advance directive? Yes (MAU/Ambulatory/Procedural Areas - Information given) No - Patient declined Yes (MAU/Ambulatory/Procedural Areas - Information given) No - Patient declined No - Patient declined No - Patient declined No - Patient declined    Current Medications (verified) Outpatient Encounter Medications as of 02/17/2022  Medication Sig   blood glucose meter kit and supplies Dispense based on patient and insurance preference. Use to check blood sugars once daily . (FOR ICD-10 E10.9, E11.9).   glucose blood (ACCU-CHEK GUIDE) test strip TEST BLOOD SUGAR ONCE DAILY (E11.9)   irbesartan (AVAPRO) 150 MG tablet Take 1 tablet (150 mg total) by mouth daily.   meclizine (ANTIVERT) 25 MG tablet Take 1 tablet (25 mg total) by mouth 3 (three) times daily as needed for dizziness.   rosuvastatin (CRESTOR) 40 MG tablet Take 1 tablet (40 mg total) by mouth daily.    Semaglutide (RYBELSUS) 7 MG TABS Take 1 tablet by mouth daily before breakfast.   No facility-administered encounter medications on file as of 02/17/2022.    Allergies (verified) Farxiga [dapagliflozin]   History: Past Medical History:  Diagnosis Date   Diabetes mellitus without complication (HTerminous    Hyperlipidemia    Hypertension    Mild sleep apnea    Prediabetes    Prolonged QT interval    Renal disorder    Past Surgical History:  Procedure Laterality Date   ABDOMINAL HYSTERECTOMY     CHOLECYSTECTOMY     COLONOSCOPY     COLONOSCOPY N/A 07/11/2017   Procedure: COLONOSCOPY;  Surgeon: RDaneil Dolin MD;  Location: [facility];  Service: Endoscopy;  Laterality: N/A;  8:30   Family History  Problem Relation Age of Onset   Heart disease Mother    Diabetes Mother    Arthritis Father    Cancer Father        prostate   Diabetes Maternal Grandmother    Social History   Socioeconomic History   Marital status: Married    Spouse name: VEvette Doffing  Number of children: 2   Years of education: Not on file   Highest education level: Not on file  Occupational History   Not on file  Tobacco Use   Smoking status: Former    Types: Cigarettes    Quit date: 06/17/1992    Years since quitting: 29.6   Smokeless  tobacco: Never   Tobacco comments:    less than a pack a week  Vaping Use   Vaping Use: Never used  Substance and Sexual Activity   Alcohol use: No   Drug use: No   Sexual activity: Yes  Other Topics Concern   Not on file  Social History Narrative   3 grandchildren and 2 great grandchildren.   Social Determinants of Health   Financial Resource Strain: Low Risk  (02/17/2022)   Overall Financial Resource Strain (CARDIA)    Difficulty of Paying Living Expenses: Not hard at all  Food Insecurity: No Food Insecurity (02/17/2022)   Hunger Vital Sign    Worried About Running Out of Food in the Last Year: Never true    Ran Out of Food in the Last Year: Never true   Transportation Needs: No Transportation Needs (02/17/2022)   PRAPARE - Hydrologist (Medical): No    Lack of Transportation (Non-Medical): No  Physical Activity: Inactive (02/17/2022)   Exercise Vital Sign    Days of Exercise per Week: 0 days    Minutes of Exercise per Session: 0 min  Stress: No Stress Concern Present (02/17/2022)   [place]    Feeling of Stress : Not at all  Social Connections: [place] (02/17/2022)   Social Connection and Isolation Panel [NHANES]    Frequency of Communication with Friends and Family: More than three times a week    Frequency of Social Gatherings with Friends and Family: Three times a week    Attends Religious Services: More than 4 times per year    Active Member of Clubs or Organizations: Yes    Attends Music therapist: More than 4 times per year    Marital Status: Married    Tobacco Counseling Counseling given: Not Answered Tobacco comments: less than a pack a week   Clinical Intake:  Pre-visit preparation completed: Yes  Pain : No/denies pain  Diabetes: Yes CBG done?: No Did pt. bring in CBG monitor from home?: No  How often do you need to have someone help you when you read instructions, pamphlets, or other written materials from your doctor or pharmacy?: 1 - Never  Diabetic?Yes Nutrition Risk Assessment:  Has the patient had any N/V/D within the last 2 months?  No  Does the patient have any non-healing wounds?  No  Has the patient had any unintentional weight loss or weight gain?  No   Diabetes:  Is the patient diabetic?  Yes  If diabetic, was a CBG obtained today?  No  Did the patient bring in their glucometer from home?  No  How often do you monitor your CBG's? As needed .   Financial Strains and Diabetes Management:  Are you having any financial strains with the device, your supplies or your  medication? No .  Does the patient want to be seen by Chronic Care Management for management of their diabetes?  No  Would the patient like to be referred to a Nutritionist or for Diabetic Management?  No   Diabetic Exams:  Diabetic Eye Exam: Completed 04/26/21 Diabetic Foot Exam: Completed 02/06/22   Interpreter Needed?: No  Information entered by :: Denman George LPN   Activities of Daily Living    02/17/2022    9:23 AM 02/14/2022   12:49 AM  In your present state of health, do you have any difficulty performing the following activities:  Hearing?  0 0  Vision? 0 0  Difficulty concentrating or making decisions? 0 0  Walking or climbing stairs? 0   Dressing or bathing? 0 0  Doing errands, shopping? 0 0  Preparing Food and eating ? N N  Using the Toilet? N N  In the past six months, have you accidently leaked urine? N Y  Do you have problems with loss of bowel control? N N  Managing your Medications? N N  Managing your Finances? N N  Housekeeping or managing your Housekeeping? N N    Patient Care Team: Kathyrn Drown, MD as PCP - General (Family Medicine) Gala Romney Cristopher Estimable, MD as Consulting Physician (Gastroenterology) Beryle Lathe, [facility] (Inactive) (Pharmacist)  Indicate any recent Medical Services you may have received from other than [facility] providers in the past year (date may be approximate).     Assessment:   This is a routine wellness examination for Aditi.  Hearing/Vision screen Hearing Screening - Comments:: Denies hearing difficulty  Vision Screening - Comments:: Wears rx glasses - up to date with routine eye exams with Dr. Jorja Loa   Dietary issues and exercise activities discussed: Current Exercise Habits: The patient does not participate in regular exercise at present   Goals Addressed             This Visit's Progress    DIET - INCREASE WATER INTAKE        Depression Screen    02/17/2022    9:22 AM 12/28/2020    2:09 PM 12/13/2020     1:11 PM 12/08/2020    2:45 PM 12/08/2020    2:23 PM 06/01/2020   10:02 AM 12/05/2019   10:19 AM  PHQ 2/9 Scores  PHQ - 2 Score 0 0 0 0 0 0 0    Fall Risk    02/17/2022    9:21 AM 02/14/2022   12:49 AM 02/09/2022    4:55 PM 12/28/2020    2:11 PM 12/24/2020   10:47 AM  Fall Risk   Falls in the past year? 0 0 0 0 0  Number falls in past yr: 0 0 0 0 0  Injury with Fall? 0 0 0 0 0  Risk for fall due to : No Fall Risks   No Fall Risks   Follow up Falls evaluation completed;Education provided;Falls prevention discussed   Falls prevention discussed     FALL RISK PREVENTION PERTAINING TO THE HOME:  Any stairs in or around the home? Yes  If so, are there any without handrails? No  Home free of loose throw rugs in walkways, pet beds, electrical cords, etc? Yes  Adequate lighting in your home to reduce risk of falls? Yes   ASSISTIVE DEVICES UTILIZED TO PREVENT FALLS:  Life alert? No  Use of a cane, walker or w/c? No  Grab bars in the bathroom? Yes  Shower chair or bench in shower? No  Elevated toilet seat or a handicapped toilet? Yes   TIMED UP AND GO:  Was the test performed? No . Telephonic visit   Cognitive Function:        02/17/2022    9:23 AM  6CIT Screen  What Year? 0 points  What month? 0 points  What time? 0 points  Count back from 20 0 points  Months in reverse 0 points  Repeat phrase 0 points  Total Score 0 points    Immunizations Immunization History  Administered Date(s) Administered   Fluad Quad(high Dose 65+) 12/05/2019, 12/14/2021  Influenza, High Dose Seasonal PF 01/11/2021   Influenza, Seasonal, Injecte, Preservative Fre 12/06/2015   Influenza,inj,Quad PF,6+ Mos 01/11/2015, 01/03/2017, 11/09/2017, 11/12/2018   Influenza-Unspecified 12/14/2021   Moderna SARS-COV2 Booster Vaccination 02/05/2020   Moderna Sars-Covid-2 Vaccination 04/05/2019, 05/06/2019, 01/11/2021   Pneumococcal Conjugate-13 10/05/2014   Pneumococcal Polysaccharide-23 12/06/2015    Unspecified SARS-COV-2 Vaccination 12/14/2021   Zoster Recombinat (Shingrix) 06/09/2021, 12/14/2021    TDAP status: Due, Education has been provided regarding the importance of this vaccine. Advised may receive this vaccine at local pharmacy or Health Dept. Aware to provide a copy of the vaccination record if obtained from local pharmacy or Health Dept. Verbalized acceptance and understanding.  Flu Vaccine status: Up to date  Pneumococcal vaccine status: Up to date  Covid-19 vaccine status: Information provided on how to obtain vaccines.   Qualifies for Shingles Vaccine? Yes   Zostavax completed No   Shingrix Completed?: Yes  Screening Tests Health Maintenance  Topic Date Due   DTaP/Tdap/Td (1 - Tdap) Never done   COVID-19 Vaccine (5 - 2023-24 season) 11/27/2022 (Originally 02/08/2022)   OPHTHALMOLOGY EXAM  04/26/2022   COLONOSCOPY (Pts 45-50yr Insurance coverage will need to be confirmed)  07/12/2022   HEMOGLOBIN A1C  08/04/2022   MAMMOGRAM  09/13/2022   Diabetic kidney evaluation - eGFR measurement  02/03/2023   Diabetic kidney evaluation - Urine ACR  02/03/2023   FOOT EXAM  02/07/2023   Medicare Annual Wellness (AWV)  02/18/2023   Pneumonia Vaccine 71 Years old  Completed   INFLUENZA VACCINE  Completed   DEXA SCAN  Completed   Hepatitis C Screening  Completed   Zoster Vaccines- Shingrix  Completed   HPV VACCINES  Aged Out    Health Maintenance  Health Maintenance Due  Topic Date Due   DTaP/Tdap/Td (1 - Tdap) Never done    Colorectal cancer screening: Type of screening: Colonoscopy. Completed 07/11/17. Repeat every 5 years  Mammogram status: Completed 09/12/21. Repeat every year  Bone Density status: Ordered today. Pt provided with contact info and advised to call to schedule appt.  Lung Cancer Screening: (Low Dose CT Chest recommended if Age 72-80years, 30 pack-year currently smoking OR have quit w/in 15years.) does not qualify.   Lung Cancer Screening  Referral: n/a  Additional Screening:  Hepatitis C Screening: does qualify; Completed 11/01/16  Vision Screening: Recommended annual ophthalmology exams for early detection of glaucoma and other disorders of the eye. Is the patient up to date with their annual eye exam?  Yes  Who is the provider or what is the name of the office in which the patient attends annual eye exams? Dr. CJorja Loa If pt is not established with a provider, would they like to be referred to a provider to establish care? No .   Dental Screening: Recommended annual dental exams for proper oral hygiene  Community Resource Referral / Chronic Care Management: CRR required this visit?  No   CCM required this visit?  No      Plan:     I have personally reviewed and noted the following in the patient's chart:   Medical and social history Use of alcohol, tobacco or illicit drugs  Current medications and supplements including opioid prescriptions. Patient is not currently taking opioid prescriptions. Functional ability and status Nutritional status Physical activity Advanced directives List of other physicians Hospitalizations, surgeries, and ER visits in previous 12 months Vitals Screenings to include cognitive, depression, and falls Referrals and appointments  In addition, I have reviewed and  discussed with patient certain preventive protocols, quality metrics, and best practice recommendations. A written personalized care plan for preventive services as well as general preventive health recommendations were provided to patient.     Vanetta Mulders, [place]   18/84/1660   Due to this being a virtual visit, the after visit summary with patients personalized plan was offered to patient via mail or my-chart. per request, patient was mailed a copy of AVS  Nurse Notes: No concerns

## 2022-02-17 NOTE — Patient Instructions (Addendum)
[name] , Thank you for taking time to come for your Medicare Wellness Visit. I appreciate your ongoing commitment to your health goals. Please review the following plan we discussed and let me know if I can assist you in the future.   These are the goals we discussed:  Goals      DIET - INCREASE WATER INTAKE     Medication Management     Patient Goals/Self-Care Activities Patient will:  Take medications as prescribed Check blood pressure at least once daily, document, and provide at future appointments Collaborate with provider on medication access solutions Engage in dietary modifications by less frequent dining out and decreased fat intake          This is a list of the screening recommended for you and due dates:  Health Maintenance  Topic Date Due   DTaP/Tdap/Td vaccine (1 - Tdap) Never done   COVID-19 Vaccine (5 - 2023-24 season) 11/27/2022*   Eye exam for diabetics  04/26/2022   Colon Cancer Screening  07/12/2022   Hemoglobin A1C  08/04/2022   Mammogram  09/13/2022   Yearly kidney function blood test for diabetes  02/03/2023   Yearly kidney health urinalysis for diabetes  02/03/2023   Complete foot exam   02/07/2023   Medicare Annual Wellness Visit  02/18/2023   Pneumonia Vaccine  Completed   Flu Shot  Completed   DEXA scan (bone density measurement)  Completed   Hepatitis C Screening: USPSTF Recommendation to screen - Ages 75-79 yo.  Completed   Zoster (Shingles) Vaccine  Completed   HPV Vaccine  Aged Out  *Topic was postponed. The date shown is not the original due date.    Advanced directives: Advance directive discussed with you today. I have provided a copy for you to complete at home and have notarized. Once this is complete please bring a copy in to our office so we can scan it into your chart.   Conditions/risks identified: Aim for 30 minutes of exercise or brisk walking, 6-8 glasses of water, and 5 servings of fruits and vegetables each day.   Next  appointment: Follow up in one year for your annual wellness visit    The number to schedule your mammogram and bone density at [facility] is [phone]  Preventive Care 65 Years and Older, Female Preventive care refers to lifestyle choices and visits with your health care provider that can promote health and wellness. What does preventive care include? A yearly physical exam. This is also called an annual well check. Dental exams once or twice a year. Routine eye exams. Ask your health care provider how often you should have your eyes checked. Personal lifestyle choices, including: Daily care of your teeth and gums. Regular physical activity. Eating a healthy diet. Avoiding tobacco and drug use. Limiting alcohol use. Practicing safe sex. Taking low-dose aspirin every day. Taking vitamin and mineral supplements as recommended by your health care provider. What happens during an annual well check? The services and screenings done by your health care provider during your annual well check will depend on your age, overall health, lifestyle risk factors, and family history of disease. Counseling  Your health care provider may ask you questions about your: Alcohol use. Tobacco use. Drug use. Emotional well-being. Home and relationship well-being. Sexual activity. Eating habits. History of falls. Memory and ability to understand (cognition). Work and work Statistician. Reproductive health. Screening  You may have the following tests or measurements: Height, weight, and BMI. Blood  pressure. Lipid and cholesterol levels. These may be checked every 5 years, or more frequently if you are over 67 years old. Skin check. Lung cancer screening. You may have this screening every year starting at age 64 if you have a 30-pack-year history of smoking and currently smoke or have quit within the past 15 years. Fecal occult blood test (FOBT) of the stool. You may have this test every year  starting at age 66. Flexible sigmoidoscopy or colonoscopy. You may have a sigmoidoscopy every 5 years or a colonoscopy every 10 years starting at age 75. Hepatitis C blood test. Hepatitis B blood test. Sexually transmitted disease (STD) testing. Diabetes screening. This is done by checking your blood sugar (glucose) after you have not eaten for a while (fasting). You may have this done every 1-3 years. Bone density scan. This is done to screen for osteoporosis. You may have this done starting at age 54. Mammogram. This may be done every 1-2 years. Talk to your health care provider about how often you should have regular mammograms. Talk with your health care provider about your test results, treatment options, and if necessary, the need for more tests. Vaccines  Your health care provider may recommend certain vaccines, such as: Influenza vaccine. This is recommended every year. Tetanus, diphtheria, and acellular pertussis (Tdap, Td) vaccine. You may need a Td booster every 10 years. Zoster vaccine. You may need this after age 77. Pneumococcal 13-valent conjugate (PCV13) vaccine. One dose is recommended after age 98. Pneumococcal polysaccharide (PPSV23) vaccine. One dose is recommended after age 87. Talk to your health care provider about which screenings and vaccines you need and how often you need them. This information is not intended to replace advice given to you by your health care provider. Make sure you discuss any questions you have with your health care provider. Document Released: 03/12/2015 Document Revised: 11/03/2015 Document Reviewed: 12/15/2014 Elsevier Interactive Patient Education  2017 [employer] Prevention in the Home Falls can cause injuries. They can happen to people of all ages. There are many things you can do to make your home safe and to help prevent falls. What can I do on the outside of my home? Regularly fix the edges of walkways and driveways and fix any  cracks. Remove anything that might make you trip as you walk through a door, such as a raised step or threshold. Trim any bushes or trees on the path to your home. Use bright outdoor lighting. Clear any walking paths of anything that might make someone trip, such as rocks or tools. Regularly check to see if handrails are loose or broken. Make sure that both sides of any steps have handrails. Any raised decks and porches should have guardrails on the edges. Have any leaves, snow, or ice cleared regularly. Use sand or salt on walking paths during winter. Clean up any spills in your garage right away. This includes oil or grease spills. What can I do in the bathroom? Use night lights. Install grab bars by the toilet and in the tub and shower. Do not use towel bars as grab bars. Use non-skid mats or decals in the tub or shower. If you need to sit down in the shower, use a plastic, non-slip stool. Keep the floor dry. Clean up any water that spills on the floor as soon as it happens. Remove soap buildup in the tub or shower regularly. Attach bath mats securely with double-sided non-slip rug tape. Do not have throw  rugs and other things on the floor that can make you trip. What can I do in the bedroom? Use night lights. Make sure that you have a light by your bed that is easy to reach. Do not use any sheets or blankets that are too big for your bed. They should not hang down onto the floor. Have a firm chair that has side arms. You can use this for support while you get dressed. Do not have throw rugs and other things on the floor that can make you trip. What can I do in the kitchen? Clean up any spills right away. Avoid walking on wet floors. Keep items that you use a lot in easy-to-reach places. If you need to reach something above you, use a strong step stool that has a grab bar. Keep electrical cords out of the way. Do not use floor polish or wax that makes floors slippery. If you must  use wax, use non-skid floor wax. Do not have throw rugs and other things on the floor that can make you trip. What can I do with my stairs? Do not leave any items on the stairs. Make sure that there are handrails on both sides of the stairs and use them. Fix handrails that are broken or loose. Make sure that handrails are as long as the stairways. Check any carpeting to make sure that it is firmly attached to the stairs. Fix any carpet that is loose or worn. Avoid having throw rugs at the top or bottom of the stairs. If you do have throw rugs, attach them to the floor with carpet tape. Make sure that you have a light switch at the top of the stairs and the bottom of the stairs. If you do not have them, ask someone to add them for you. What else can I do to help prevent falls? Wear shoes that: Do not have high heels. Have rubber bottoms. Are comfortable and fit you well. Are closed at the toe. Do not wear sandals. If you use a stepladder: Make sure that it is fully opened. Do not climb a closed stepladder. Make sure that both sides of the stepladder are locked into place. Ask someone to hold it for you, if possible. Clearly mark and make sure that you can see: Any grab bars or handrails. First and last steps. Where the edge of each step is. Use tools that help you move around (mobility aids) if they are needed. These include: Canes. Walkers. Scooters. Crutches. Turn on the lights when you go into a dark area. Replace any light bulbs as soon as they burn out. Set up your furniture so you have a clear path. Avoid moving your furniture around. If any of your floors are uneven, fix them. If there are any pets around you, be aware of where they are. Review your medicines with your doctor. Some medicines can make you feel dizzy. This can increase your chance of falling. Ask your doctor what other things that you can do to help prevent falls. This information is not intended to replace  advice given to you by your health care provider. Make sure you discuss any questions you have with your health care provider. Document Released: 12/10/2008 Document Revised: 07/22/2015 Document Reviewed: 03/20/2014 Elsevier Interactive Patient Education  2017 [employer].

## 2022-04-27 DIAGNOSIS — E119 Type 2 diabetes mellitus without complications: Secondary | ICD-10-CM | POA: Diagnosis not present

## 2022-04-27 LAB — HM DIABETES EYE EXAM

## 2022-05-17 IMAGING — MG DIGITAL SCREENING BILAT W/ TOMO W/ CAD
6 of 10 series · 6 of 30 positions shown · non-contrast
Comparison: Previous exam(s).

CLINICAL DATA: Screening.

EXAM:
DIGITAL SCREENING BILATERAL MAMMOGRAM WITH TOMO AND CAD

[L MLO synth-2D]
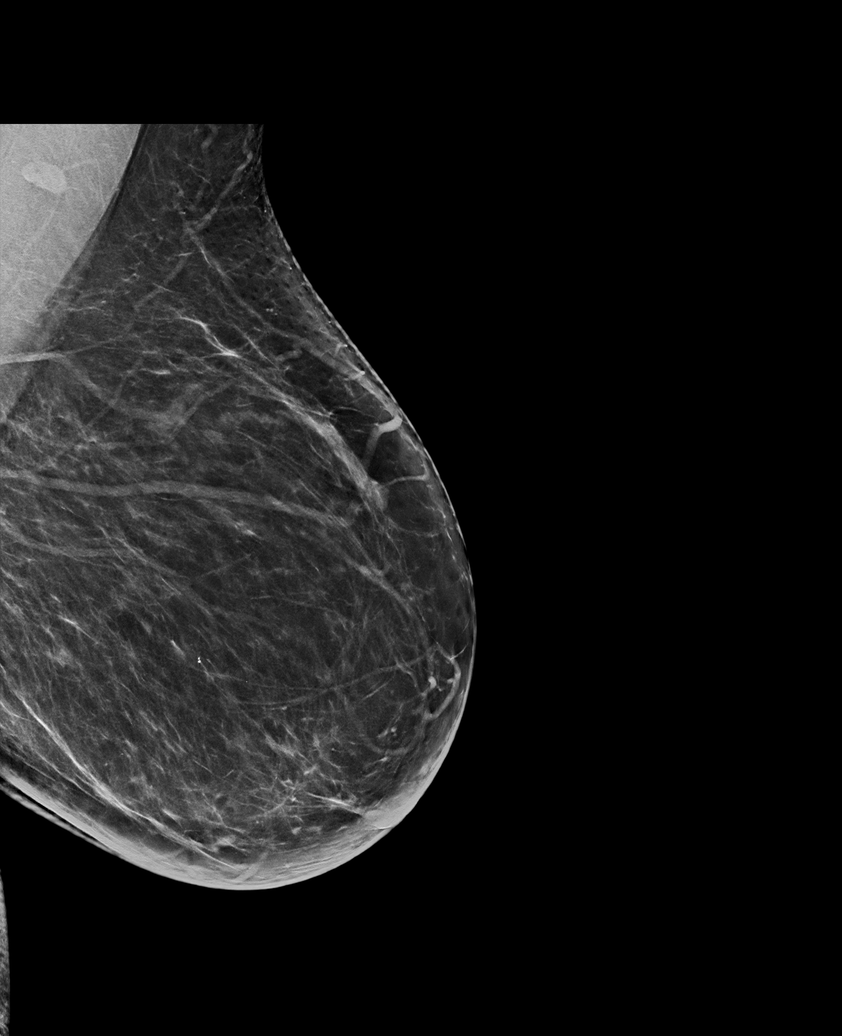

[L CC synth-2D]
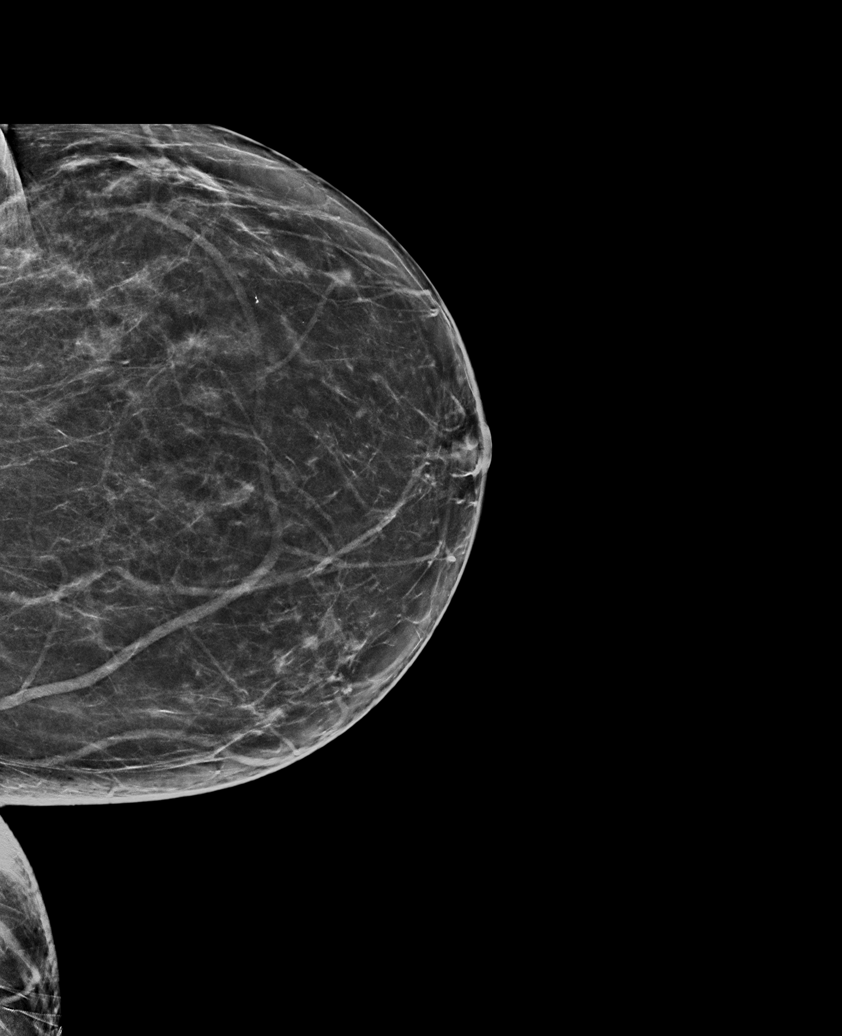

[R CC synth-2D (1 of 2)]
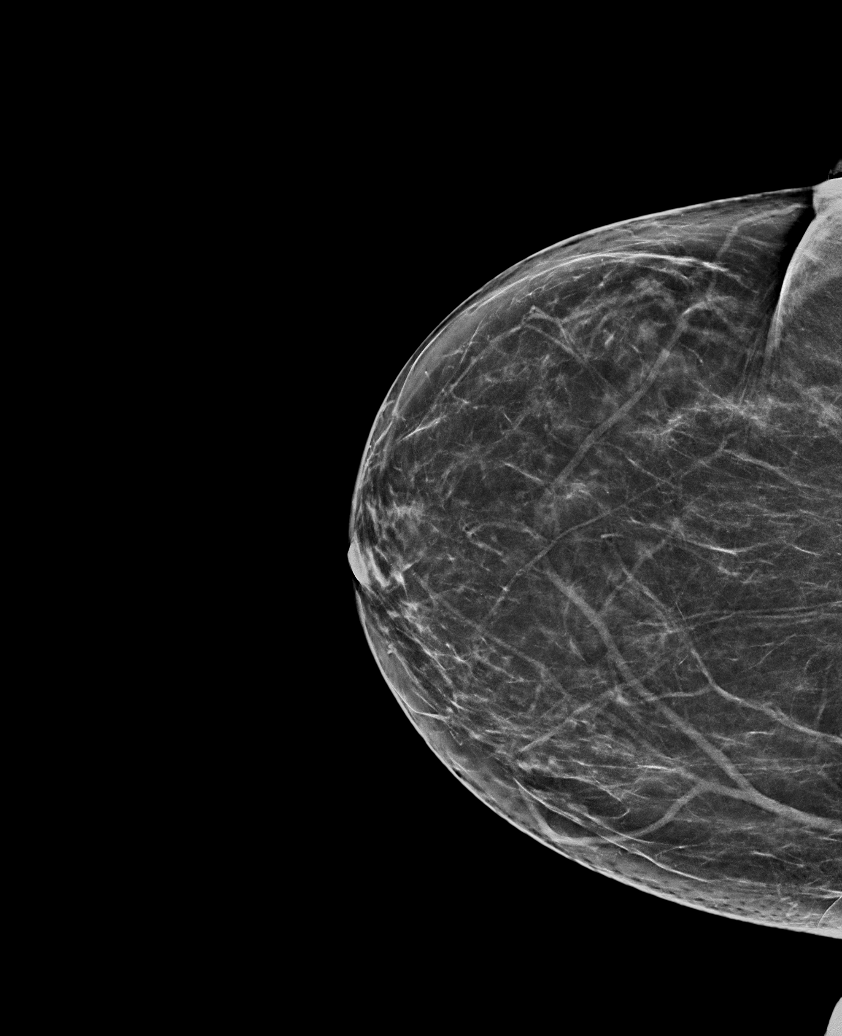

[R MLO synth-2D]
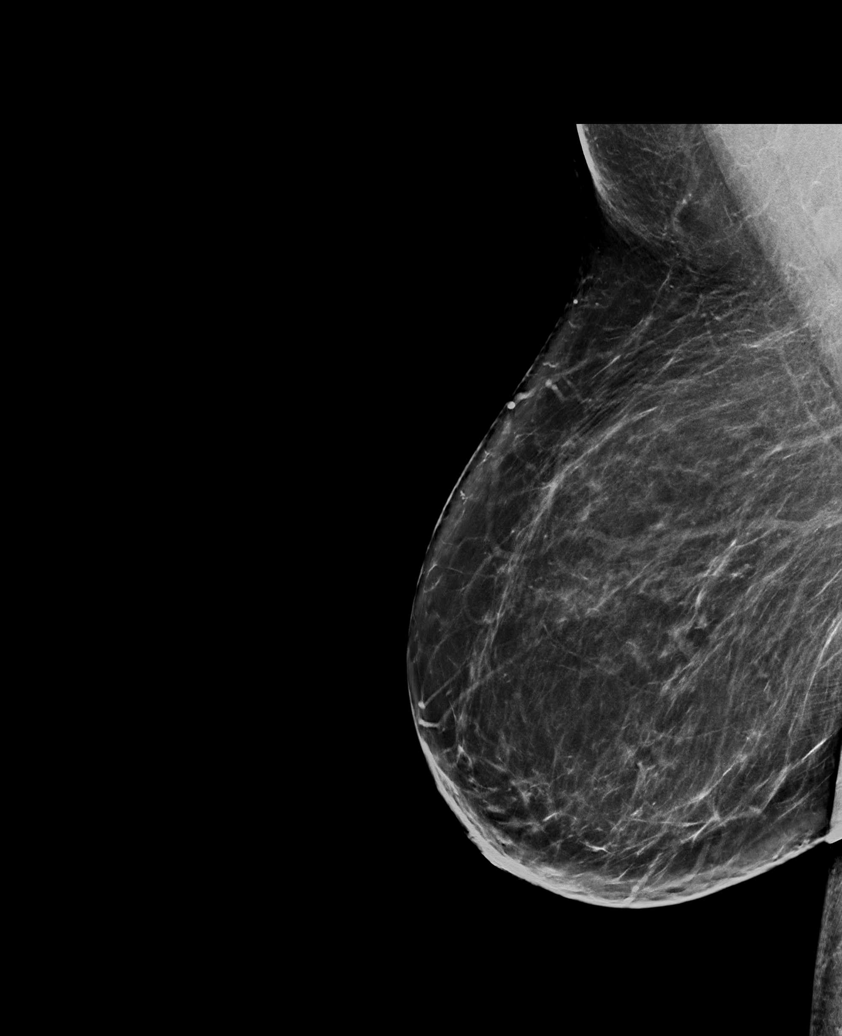

[R CC synth-2D (2 of 2)]
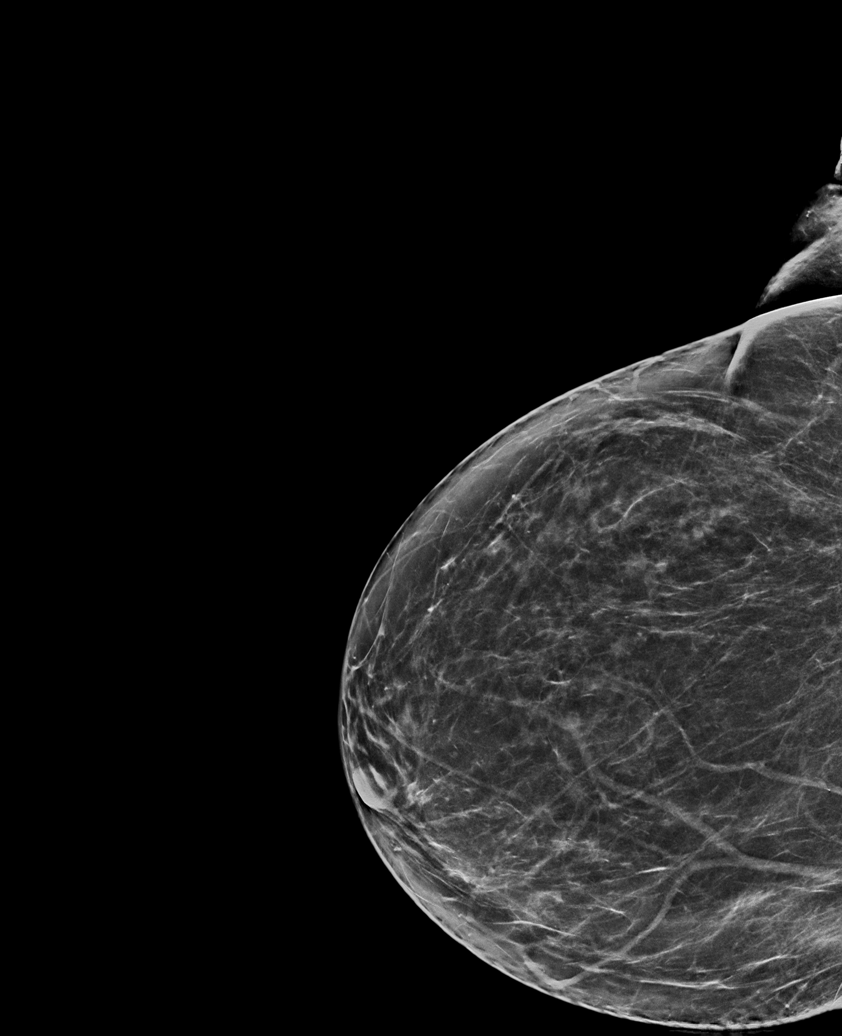

[L MLO tomo · tomo slice 41/81.0]
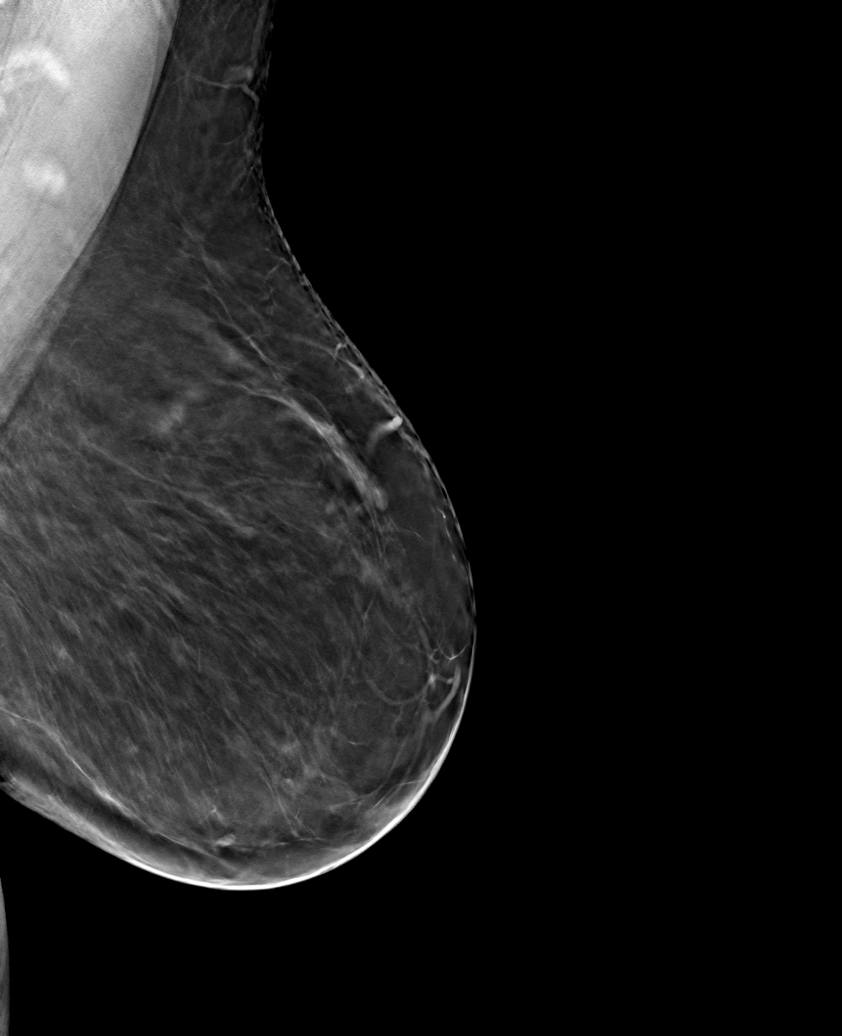

[6 of 30 positions shown; findings below may reference images not displayed]

ACR Breast Density Category b: There are scattered areas of
fibroglandular density.
FINDINGS: There are no findings suspicious for malignancy. Images were
processed with CAD.
IMPRESSION: No mammographic evidence of malignancy. A result letter of this
screening mammogram will be mailed directly to the patient.

RECOMMENDATION:
Screening mammogram in one year. (Code:[id number])

BI-RADS CATEGORY  1: Negative.

## 2022-05-26 ENCOUNTER — Encounter: Payer: Self-pay | Admitting: *Deleted

## 2022-06-01 ENCOUNTER — Encounter: Payer: Self-pay | Admitting: Family Medicine

## 2022-06-01 ENCOUNTER — Ambulatory Visit (INDEPENDENT_AMBULATORY_CARE_PROVIDER_SITE_OTHER): Payer: Medicare Other | Admitting: Family Medicine

## 2022-06-01 VITALS — BP 134/72 | Temp 97.9°F | Ht 59.0 in | Wt 204.0 lb

## 2022-06-01 DIAGNOSIS — R1030 Lower abdominal pain, unspecified: Secondary | ICD-10-CM

## 2022-06-01 DIAGNOSIS — R195 Other fecal abnormalities: Secondary | ICD-10-CM | POA: Diagnosis not present

## 2022-06-01 LAB — POCT URINALYSIS DIP (CLINITEK)
Bilirubin, UA: NEGATIVE
Glucose, UA: NEGATIVE mg/dL
Ketones, POC UA: NEGATIVE mg/dL
Leukocytes, UA: NEGATIVE
Nitrite, UA: NEGATIVE
Spec Grav, UA: 1.02 (ref 1.010–1.025)
Urobilinogen, UA: 0.2 E.U./dL
pH, UA: 5 (ref 5.0–8.0)

## 2022-06-01 NOTE — Progress Notes (Signed)
   Subjective:    Patient ID: [name], female    DOB: 1950-05-04, 72 y.o.   MRN: [id number]  HPI Loose stool for 2 days 3 to 6 times per day lower abdominal pain with movement, and  Patient with significant lower abdominal discomfort intermittent in addition to this loose stools multiple times per day for a few days and then relates a little less frequency to the loose stools past couple days but still present Having lower abdominal discomfort Denies blood or mucus in the stools Denies fever or chills Appetite is subpar Eating some drinking some but not a lot  [place] follow up-she states that she is no longer able to get the medication it was somehow sent to her on a patient assistance program She is interested in trying to get it again if possible  Review of Systems     Objective:   Physical Exam  General-in no acute distress Eyes-no discharge Lungs-respiratory rate normal, CTA CV-no murmurs,RRR Extremities skin warm dry no edema Neuro grossly normal Behavior normal, alert Mild tenderness mid abdomen and lower abdomen no guarding or rebound      Assessment & Plan:  Abdominal tenderness Loose stools Possibly colitis Urinalysis under the microscope negative for infection Need to move forward with lab testing.  Also possibility of having to do CT scan depending on the results of the test. Patient encouraged to try to nibble on food and drink liquids on a regular basis If high fevers severe pain go to ER

## 2022-06-04 LAB — COMPREHENSIVE METABOLIC PANEL
ALT: 18 IU/L (ref 0–32)
AST: 21 IU/L (ref 0–40)
Albumin/Globulin Ratio: 1.5 (ref 1.2–2.2)
Albumin: 4.3 g/dL (ref 3.8–4.8)
Alkaline Phosphatase: 111 IU/L (ref 44–121)
BUN/Creatinine Ratio: 15 (ref 12–28)
BUN: 14 mg/dL (ref 8–27)
Bilirubin Total: 0.6 mg/dL (ref 0.0–1.2)
CO2: 23 mmol/L (ref 20–29)
Calcium: 9.8 mg/dL (ref 8.7–10.3)
Chloride: 105 mmol/L (ref 96–106)
Creatinine, Ser: 0.94 mg/dL (ref 0.57–1.00)
Globulin, Total: 2.9 g/dL (ref 1.5–4.5)
Glucose: 94 mg/dL (ref 70–99)
Potassium: 4.4 mmol/L (ref 3.5–5.2)
Sodium: 141 mmol/L (ref 134–144)
Total Protein: 7.2 g/dL (ref 6.0–8.5)
eGFR: 64 mL/min/{1.73_m2} (ref >59)

## 2022-06-04 LAB — CBC WITH DIFFERENTIAL/PLATELET
Basophils Absolute: 0 10*3/uL (ref 0.0–0.2)
Basos: 1 %
EOS (ABSOLUTE): 0.1 10*3/uL (ref 0.0–0.4)
Eos: 1 %
Hematocrit: 44.2 % (ref 34.0–46.6)
Hemoglobin: 14.5 g/dL (ref 11.1–15.9)
Immature Grans (Abs): 0 10*3/uL (ref 0.0–0.1)
Immature Granulocytes: 0 %
Lymphocytes Absolute: 2.1 10*3/uL (ref 0.7–3.1)
Lymphs: 36 %
MCH: 30.1 pg (ref 26.6–33.0)
MCHC: 32.8 g/dL (ref 31.5–35.7)
MCV: 92 fL (ref 79–97)
Monocytes Absolute: 0.4 10*3/uL (ref 0.1–0.9)
Monocytes: 7 %
Neutrophils Absolute: 3.2 10*3/uL (ref 1.4–7.0)
Neutrophils: 55 %
Platelets: 259 10*3/uL (ref 150–450)
RBC: 4.82 x10E6/uL (ref 3.77–5.28)
RDW: 13 % (ref 11.7–15.4)
WBC: 5.8 10*3/uL (ref 3.4–10.8)

## 2022-06-04 LAB — C-REACTIVE PROTEIN: CRP: 15 mg/L — ABNORMAL HIGH (ref 0–10)

## 2022-06-04 LAB — LIPASE: Lipase: 76 U/L (ref 14–85)

## 2022-06-06 ENCOUNTER — Telehealth: Payer: Self-pay | Admitting: Family Medicine

## 2022-06-06 NOTE — Telephone Encounter (Signed)
Patient would like results of labs. She seen them on my chart but no result notes from doctor.  

## 2022-06-06 NOTE — Telephone Encounter (Signed)
Patient would like results of labs. She seen them on my chart but no result notes from doctor.

## 2022-06-07 ENCOUNTER — Encounter: Payer: Self-pay | Admitting: *Deleted

## 2022-06-08 NOTE — Telephone Encounter (Signed)
So the result note was sent to the nurses several days ago.  Please see result note.  Patient will need to give [name] an update how she is doing may need to do additional testing

## 2022-06-08 NOTE — Telephone Encounter (Signed)
Left message with spouse to have her return call

## 2022-06-08 NOTE — Telephone Encounter (Signed)
Results discussed with patient. Patient please let patient know CBC shows a normal white blood count kidney function liver function looks good pancreatic test looks good.  C-reactive protein is elevated which could correlate with her lower abdominal tenderness Please see how she is doing in regards to #1 if she having loose stools watery stools or mucousy stools or bloody stools?  #2 if she having much in the way of lower abdominal pain or discomfort?  We may need to get gastroenterology may even have to consider doing a scan please see how she is doing thank you-Dr. Lilyan Punt   Patient verbalized understanding. Patient stated she is able to eat, no loose stools, few mucoid stools, abdominal pain only in the morning. Overall the patient feels like she is doing better. Patient stated she is scheduled for a colonoscopy in May.

## 2022-06-08 NOTE — Telephone Encounter (Signed)
I did have conversation with the patient.  We will make sure that her gastroenterologist is aware of her lower abdominal tenderness

## 2022-08-01 ENCOUNTER — Telehealth: Payer: Self-pay | Admitting: Family Medicine

## 2022-08-01 DIAGNOSIS — Z79899 Other long term (current) drug therapy: Secondary | ICD-10-CM

## 2022-08-01 DIAGNOSIS — E1169 Type 2 diabetes mellitus with other specified complication: Secondary | ICD-10-CM

## 2022-08-01 DIAGNOSIS — E0822 Diabetes mellitus due to underlying condition with diabetic chronic kidney disease: Secondary | ICD-10-CM

## 2022-08-01 NOTE — Telephone Encounter (Signed)
A1c, lipid, liver, metabolic 7, urine ACR Diabetes, hyperlipidemia hypertension

## 2022-08-01 NOTE — Telephone Encounter (Signed)
Patient has office visit 6/12 ,will she need labs done.

## 2022-08-03 ENCOUNTER — Encounter: Payer: Self-pay | Admitting: *Deleted

## 2022-08-03 NOTE — Telephone Encounter (Signed)
Blood work ordered in [employer]. My chart message sent to notify patient.

## 2022-08-04 DIAGNOSIS — N1831 Chronic kidney disease, stage 3a: Secondary | ICD-10-CM | POA: Diagnosis not present

## 2022-08-04 DIAGNOSIS — Z79899 Other long term (current) drug therapy: Secondary | ICD-10-CM | POA: Diagnosis not present

## 2022-08-04 DIAGNOSIS — E1169 Type 2 diabetes mellitus with other specified complication: Secondary | ICD-10-CM | POA: Diagnosis not present

## 2022-08-04 DIAGNOSIS — E0822 Diabetes mellitus due to underlying condition with diabetic chronic kidney disease: Secondary | ICD-10-CM | POA: Diagnosis not present

## 2022-08-04 DIAGNOSIS — E785 Hyperlipidemia, unspecified: Secondary | ICD-10-CM | POA: Diagnosis not present

## 2022-08-05 LAB — MICROALBUMIN / CREATININE URINE RATIO
Creatinine, Urine: 116.9 mg/dL
Microalb/Creat Ratio: 21 mg/g creat (ref 0–29)
Microalbumin, Urine: 24 ug/mL

## 2022-08-05 LAB — HEPATIC FUNCTION PANEL
ALT: 18 IU/L (ref 0–32)
AST: 17 IU/L (ref 0–40)
Albumin: 4.1 g/dL (ref 3.8–4.8)
Alkaline Phosphatase: 118 IU/L (ref 44–121)
Bilirubin Total: 0.4 mg/dL (ref 0.0–1.2)
Bilirubin, Direct: 0.14 mg/dL (ref 0.00–0.40)
Total Protein: 6.5 g/dL (ref 6.0–8.5)

## 2022-08-05 LAB — LIPID PANEL
Chol/HDL Ratio: 2.5 ratio (ref 0.0–4.4)
Cholesterol, Total: 140 mg/dL (ref 100–199)
HDL: 56 mg/dL (ref >39)
LDL Chol Calc (NIH): 72 mg/dL (ref 0–99)
Triglycerides: 56 mg/dL (ref 0–149)
VLDL Cholesterol Cal: 12 mg/dL (ref 5–40)

## 2022-08-05 LAB — BASIC METABOLIC PANEL
BUN/Creatinine Ratio: 16 (ref 12–28)
BUN: 15 mg/dL (ref 8–27)
CO2: 22 mmol/L (ref 20–29)
Calcium: 9.4 mg/dL (ref 8.7–10.3)
Chloride: 108 mmol/L — ABNORMAL HIGH (ref 96–106)
Creatinine, Ser: 0.95 mg/dL (ref 0.57–1.00)
Glucose: 117 mg/dL — ABNORMAL HIGH (ref 70–99)
Potassium: 4.3 mmol/L (ref 3.5–5.2)
Sodium: 143 mmol/L (ref 134–144)
eGFR: 64 mL/min/{1.73_m2} (ref >59)

## 2022-08-05 LAB — HEMOGLOBIN A1C
Est. average glucose Bld gHb Est-mCnc: 151 mg/dL
Hgb A1c MFr Bld: 6.9 % — ABNORMAL HIGH (ref 4.8–5.6)

## 2022-08-09 ENCOUNTER — Ambulatory Visit (INDEPENDENT_AMBULATORY_CARE_PROVIDER_SITE_OTHER): Payer: Medicare Other | Admitting: Family Medicine

## 2022-08-09 VITALS — BP 136/86 | HR 67 | Ht 59.0 in | Wt 212.0 lb

## 2022-08-09 DIAGNOSIS — E785 Hyperlipidemia, unspecified: Secondary | ICD-10-CM | POA: Diagnosis not present

## 2022-08-09 DIAGNOSIS — Z7984 Long term (current) use of oral hypoglycemic drugs: Secondary | ICD-10-CM

## 2022-08-09 DIAGNOSIS — I10 Essential (primary) hypertension: Secondary | ICD-10-CM | POA: Diagnosis not present

## 2022-08-09 DIAGNOSIS — R6884 Jaw pain: Secondary | ICD-10-CM

## 2022-08-09 DIAGNOSIS — E1169 Type 2 diabetes mellitus with other specified complication: Secondary | ICD-10-CM | POA: Diagnosis not present

## 2022-08-09 DIAGNOSIS — G4730 Sleep apnea, unspecified: Secondary | ICD-10-CM | POA: Diagnosis not present

## 2022-08-09 MED ORDER — ROSUVASTATIN CALCIUM 40 MG PO TABS: 40.0000 mg | ORAL_TABLET | Freq: Every day | ORAL | 1 refills | Status: DC

## 2022-08-09 MED ORDER — MELOXICAM 7.5 MG PO TABS
7.5000 mg | ORAL_TABLET | Freq: Every day | ORAL | 0 refills | Status: DC
Start: 2022-08-09 — End: 2023-01-09

## 2022-08-09 MED ORDER — METFORMIN HCL ER 500 MG PO TB24: 500.0000 mg | ORAL_TABLET | Freq: Every day | ORAL | 5 refills | Status: DC

## 2022-08-09 MED ORDER — IRBESARTAN 150 MG PO TABS: 150.0000 mg | ORAL_TABLET | Freq: Every day | ORAL | 1 refills | Status: DC

## 2022-08-09 NOTE — Progress Notes (Signed)
Subjective:    Patient ID: [name], female    DOB: 03/27/1950, 72 y.o.   MRN: [id number]  HPI Patient arrives today for 6 month follow up.   Patient states she is having issues with left jaw hurting, when she is chewing she can hear grinding.  Dentist informed her to follow up  with PCP.  Hyperlipidemia associated with type 2 diabetes mellitus (HCC)  Essential hypertension, benign - Plan: irbesartan (AVAPRO) 150 MG tablet  Sleep apnea, unspecified type - Plan: Ambulatory referral to Sleep Studies  Morbid obesity (HCC)  Jaw pain  Outpatient Encounter Medications as of 08/09/2022  Medication Sig Note   blood glucose meter kit and supplies Dispense based on patient and insurance preference. Use to check blood sugars once daily . (FOR ICD-10 E10.9, E11.9).    glucose blood (ACCU-CHEK GUIDE) test strip TEST BLOOD SUGAR ONCE DAILY (E11.9)    meclizine (ANTIVERT) 25 MG tablet Take 1 tablet (25 mg total) by mouth 3 (three) times daily as needed for dizziness.    meloxicam (MOBIC) 7.5 MG tablet Take 1 tablet (7.5 mg total) by mouth daily.    metFORMIN (GLUCOPHAGE-XR) 500 MG 24 hr tablet Take 1 tablet (500 mg total) by mouth daily with breakfast.    [DISCONTINUED] irbesartan (AVAPRO) 150 MG tablet Take 1 tablet (150 mg total) by mouth daily.    [DISCONTINUED] rosuvastatin (CRESTOR) 40 MG tablet Take 1 tablet (40 mg total) by mouth daily.    irbesartan (AVAPRO) 150 MG tablet Take 1 tablet (150 mg total) by mouth daily.    rosuvastatin (CRESTOR) 40 MG tablet Take 1 tablet (40 mg total) by mouth daily.    [DISCONTINUED] Semaglutide (RYBELSUS) 7 MG TABS Take 1 tablet by mouth daily before breakfast. (Patient not taking: Reported on 06/01/2022) 06/01/2022: Did not receive   No facility-administered encounter medications on file as of 08/09/2022.    Results for orders placed or performed in visit on 08/01/22  Hemoglobin A1c  Result Value Ref Range   Hgb A1c MFr Bld 6.9 (H) 4.8 - 5.6 %    Est. average glucose Bld gHb Est-mCnc 151 mg/dL  Lipid panel  Result Value Ref Range   Cholesterol, Total 140 100 - 199 mg/dL   Triglycerides 56 0 - 149 mg/dL   HDL 56 >40 mg/dL   VLDL Cholesterol Cal 12 5 - 40 mg/dL   LDL Chol Calc (NIH) 72 0 - 99 mg/dL   Chol/HDL Ratio 2.5 0.0 - 4.4 ratio  Hepatic function panel  Result Value Ref Range   Total Protein 6.5 6.0 - 8.5 g/dL   Albumin 4.1 3.8 - 4.8 g/dL   Bilirubin Total 0.4 0.0 - 1.2 mg/dL   Bilirubin, Direct 9.81 0.00 - 0.40 mg/dL   Alkaline Phosphatase 118 44 - 121 IU/L   AST 17 0 - 40 IU/L   ALT 18 0 - 32 IU/L  Basic metabolic panel  Result Value Ref Range   Glucose 117 (H) 70 - 99 mg/dL   BUN 15 8 - 27 mg/dL   Creatinine, Ser 1.91 0.57 - 1.00 mg/dL   eGFR 64 >47 WG/NFA/2.13   BUN/Creatinine Ratio 16 12 - 28   Sodium 143 134 - 144 mmol/L   Potassium 4.3 3.5 - 5.2 mmol/L   Chloride 108 (H) 96 - 106 mmol/L   CO2 22 20 - 29 mmol/L   Calcium 9.4 8.7 - 10.3 mg/dL  Microalbumin / creatinine urine ratio  Result Value Ref Range  Creatinine, Urine 116.9 Not Estab. mg/dL   Microalbumin, Urine 21.3 Not Estab. ug/mL   Microalb/Creat Ratio 21 0 - 29 mg/g creat   Past Medical History:  Diagnosis Date   Diabetes mellitus without complication (HCC)    Hyperlipidemia    Hypertension    Mild sleep apnea    Prediabetes    Prolonged QT interval    Renal disorder      Review of Systems     Objective:   Physical Exam General-in no acute distress Eyes-no discharge Lungs-respiratory rate normal, CTA CV-no murmurs,RRR Extremities skin warm dry no edema Neuro grossly normal Behavior normal, alert Diabetic foot exam today  Tenderness in the left jaw area near the TMJ hurts to chew feels grinding when she chews     Assessment & Plan:  1. Hyperlipidemia associated with type 2 diabetes mellitus (HCC) Hyperlipidemia continue current measures Continue medicine continue diet We did discuss ways to improve diet and improve  activity  2. Essential hypertension, benign Blood pressure decent control continue current measures - irbesartan (AVAPRO) 150 MG tablet; Take 1 tablet (150 mg total) by mouth daily.  Dispense: 90 tablet; Refill: 1  3. Sleep apnea, unspecified type Patient complains of fatigue tiredness feeling rundown snores at night that combined with blood pressure body habitus and wide girth to her neck her STOP-BANG evaluation rates high probability of sleep apnea recommend sleep studies - Ambulatory referral to Sleep Studies  4. Morbid obesity (HCC) Portion control regular activity  5. Jaw pain More than likely inflammation in the TMJ meloxicam for the next 7 to 10 days not for long-term use if any problems notify us If not doing dramatically better within the next 2 weeks referral to oral surgeon for further evaluation recommend soft diet for right now

## 2022-09-18 ENCOUNTER — Encounter: Payer: Self-pay | Admitting: Neurology

## 2022-09-18 ENCOUNTER — Ambulatory Visit (INDEPENDENT_AMBULATORY_CARE_PROVIDER_SITE_OTHER): Payer: Medicare Other | Admitting: Neurology

## 2022-09-18 VITALS — BP 166/88 | HR 82 | Ht 59.0 in | Wt 212.0 lb

## 2022-09-18 DIAGNOSIS — Z9189 Other specified personal risk factors, not elsewhere classified: Secondary | ICD-10-CM

## 2022-09-18 DIAGNOSIS — R351 Nocturia: Secondary | ICD-10-CM | POA: Diagnosis not present

## 2022-09-18 DIAGNOSIS — G4719 Other hypersomnia: Secondary | ICD-10-CM | POA: Diagnosis not present

## 2022-09-18 DIAGNOSIS — R0683 Snoring: Secondary | ICD-10-CM

## 2022-09-18 DIAGNOSIS — R030 Elevated blood-pressure reading, without diagnosis of hypertension: Secondary | ICD-10-CM

## 2022-09-18 NOTE — Patient Instructions (Signed)

## 2022-09-18 NOTE — Progress Notes (Signed)
Subjective:    Patient ID: [name] is a 72 y.o. female.  HPI    Huston Foley, MD, PhD [facility] [address] [address] [place], [place] [address]  Dear Dr. Gerda Diss,  I saw your patient, [name], upon your kind request in my sleep clinic today for initial consultation of her sleep disorder, in particular, concern for underlying obstructive sleep apnea.  The patient is unaccompanied today.  As you know, Ms. Goleman is a 72 year old female with an underlying medical history of hypertension, hyperlipidemia, diabetes, and severe obesity with a BMI of over 40, who reports snoring and excessive daytime somnolence.  Her Epworth sleepiness score is 15 out of 24, fatigue severity score is 37 out of 63.  I reviewed your office note from 08/09/2022.  She denies any witnessed apneas, waking up with a sense of gasping for air. She had a sleep study several years ago but results are not available for my review today.  She has not been on PAP therapy, but recalls that she was offered a CPAP machine at the time.  She has gained weight over time.  She lives with her husband, she has 1 grown son and 1 granddaughter.  They have no pets at the house.  Bedtime is generally between midnight and 1 AM and rise time around 6 AM.  She has nocturia about once per average night, denies recurrent morning headaches.  She sometimes has trouble staying asleep and watches TV in the den, she does not typically have the TV on in the bedroom although she would probably prefer to watch TV in the bedroom but her husband does not like to have a TV on at night.  She drinks no daily caffeine, usually decaf coffee in the morning and no daily soda.  She has not taken her blood pressure medication this morning yet and her blood pressure is mildly elevated.  She has no symptoms such as chest pressure, shortness of breath, blurry vision or headache.  She is not aware of any family history of sleep  apnea.   Her Past Medical History Is Significant For: Past Medical History:  Diagnosis Date   Diabetes mellitus without complication (HCC)    Hyperlipidemia    Hypertension    Mild sleep apnea    Prediabetes    Prolonged QT interval    Renal disorder     Her Past Surgical History Is Significant For: Past Surgical History:  Procedure Laterality Date   ABDOMINAL HYSTERECTOMY     CHOLECYSTECTOMY     COLONOSCOPY     COLONOSCOPY N/A 07/11/2017   Procedure: COLONOSCOPY;  Surgeon: Corbin Ade, MD;  Location: [facility];  Service: Endoscopy;  Laterality: N/A;  8:30    Her Family History Is Significant For: Family History  Problem Relation Age of Onset   Heart disease Mother    Diabetes Mother    Arthritis Father    Cancer Father        prostate   Diabetes Maternal Grandmother    Sleep apnea Neg Hx     Her Social History Is Significant For: Social History   Socioeconomic History   Marital status: Married    Spouse name: Oswaldo Done   Number of children: 2   Years of education: Not on file   Highest education level: GED or equivalent  Occupational History   Not on file  Tobacco Use   Smoking status: Former    Current packs/day:  0.00    Types: Cigarettes    Quit date: 06/17/1992    Years since quitting: 30.2   Smokeless tobacco: Never   Tobacco comments:    less than a pack a week  Vaping Use   Vaping status: Never Used  Substance and Sexual Activity   Alcohol use: No   Drug use: No   Sexual activity: Yes  Other Topics Concern   Not on file  Social History Narrative   3 grandchildren and 2 great grandchildren.   Social Determinants of Health   Financial Resource Strain: Low Risk  (05/30/2022)   Overall Financial Resource Strain (CARDIA)    Difficulty of Paying Living Expenses: Not hard at all  Food Insecurity: No Food Insecurity (05/30/2022)   Hunger Vital Sign    Worried About Running Out of Food in the Last Year: Never true    Ran Out of Food in the Last  Year: Never true  Transportation Needs: No Transportation Needs (05/30/2022)   PRAPARE - Administrator, Civil Service (Medical): No    Lack of Transportation (Non-Medical): No  Physical Activity: Inactive (05/30/2022)   Exercise Vital Sign    Days of Exercise per Week: 0 days    Minutes of Exercise per Session: 0 min  Stress: No Stress Concern Present (05/30/2022)   [employer] of Occupational Health - Occupational Stress Questionnaire    Feeling of Stress : Only a little  Social Connections: Socially Integrated (05/30/2022)   Social Connection and Isolation Panel [NHANES]    Frequency of Communication with Friends and Family: More than three times a week    Frequency of Social Gatherings with Friends and Family: Three times a week    Attends Religious Services: More than 4 times per year    Active Member of Clubs or Organizations: Yes    Attends Banker Meetings: More than 4 times per year    Marital Status: Married    Her Allergies Are:  Allergies  Allergen Reactions   Farxiga [Dapagliflozin] Other (See Comments)    Frequent urination and excessive thirst  :   Her Current Medications Are:  Outpatient Encounter Medications as of 09/18/2022  Medication Sig   blood glucose meter kit and supplies Dispense based on patient and insurance preference. Use to check blood sugars once daily . (FOR ICD-10 E10.9, E11.9).   glucose blood (ACCU-CHEK GUIDE) test strip TEST BLOOD SUGAR ONCE DAILY (E11.9)   irbesartan (AVAPRO) 150 MG tablet Take 1 tablet (150 mg total) by mouth daily.   meclizine (ANTIVERT) 25 MG tablet Take 1 tablet (25 mg total) by mouth 3 (three) times daily as needed for dizziness.   meloxicam (MOBIC) 7.5 MG tablet Take 1 tablet (7.5 mg total) by mouth daily.   metFORMIN (GLUCOPHAGE-XR) 500 MG 24 hr tablet Take 1 tablet (500 mg total) by mouth daily with breakfast.   rosuvastatin (CRESTOR) 40 MG tablet Take 1 tablet (40 mg total) by mouth daily.   No  facility-administered encounter medications on file as of 09/18/2022.  :   Review of Systems:  Out of a complete 14 point review of systems, all are reviewed and negative with the exception of these symptoms as listed below:   Review of Systems  Neurological:        Pt here for sleep consult Pt snores ,fatigue,hypertension Pt denies headaches Pt states had sleep study done but doesn't know when and no CPAP machine    ESS: FSS:  Objective:  Neurological Exam  Physical Exam Physical Examination:   Vitals:   09/18/22 1101  BP: (!) 166/88  Pulse: 82    General Examination: The patient is a very pleasant 72 y.o. female in no acute distress. She appears well-developed and well-nourished and well groomed.   HEENT: Normocephalic, atraumatic, pupils are equal, round and reactive to light, extraocular tracking is good without limitation to gaze excursion or nystagmus noted. Hearing is grossly intact. Face is symmetric with normal facial animation. Speech is clear with no dysarthria noted. There is no hypophonia. There is no lip, neck/head, jaw or voice tremor. Neck is supple with full range of passive and active motion. There are no carotid bruits on auscultation. Oropharynx exam reveals: mild airway crowding. Neck size of 17 1/8 in.    Chest: Clear to auscultation without wheezing, rhonchi or crackles noted.  Heart: S1+S2+0, regular and normal without murmurs, rubs or gallops noted.   Abdomen: Soft, non-tender and non-distended.  Extremities: There is 2+ pitting edema in the right distal lower extremity, 1-2+ pitting edema in the left distal lower extremity.   Skin: Warm and dry without trophic changes noted.   Musculoskeletal: exam reveals no obvious joint deformities.   Neurologically:  Mental status: The patient is awake, alert and oriented in all 4 spheres. Her immediate and remote memory, attention, language skills and fund of knowledge are appropriate. There is no evidence  of aphasia, agnosia, apraxia or anomia. Speech is clear with normal prosody and enunciation. Thought process is linear. Mood is normal and affect is normal.  Cranial nerves II - XII are as described above under HEENT exam.  Motor exam: Normal bulk, strength and tone is noted. There is no obvious action or resting tremor.  Fine motor skills and coordination: grossly intact.  Cerebellar testing: No dysmetria or intention tremor. There is no truncal or gait ataxia.  Sensory exam: intact to light touch in the upper and lower extremities.  Gait, station and balance: He stands easily. No veering to one side is noted. No leaning to one side is noted. Posture is age-appropriate and stance is narrow based. Gait shows normal stride length and normal pace. No problems turning are noted.   Assessment and Plan:  In summary, TRESA JOLLEY is a very pleasant 72 y.o.-year old female with an underlying medical history of hypertension, hyperlipidemia, diabetes, and severe obesity with a BMI of over 40, whose history and physical exam are concerning for sleep disordered breathing, particularly obstructive sleep apnea (OSA).  While a laboratory attended sleep study is typically considered "gold standard" for evaluation of sleep disordered breathing, we mutually agreed to proceed with a home sleep test at this time.   I had a long chat with the patient about my findings and the diagnosis of sleep apnea, particularly OSA, its prognosis and treatment options. We talked about medical/conservative treatments, surgical interventions and non-pharmacological approaches for symptom control. I explained, in particular, the risks and ramifications of untreated moderate to severe OSA, especially with respect to developing cardiovascular disease down the road, including congestive heart failure (CHF), difficult to treat hypertension, cardiac arrhythmias (particularly A-fib), neurovascular complications including TIA, stroke and  dementia. Even type 2 diabetes has, in part, been linked to untreated OSA. Symptoms of untreated OSA may include (but may not be limited to) daytime sleepiness, nocturia (i.e. frequent nighttime urination), memory problems, mood irritability and suboptimally controlled or worsening mood disorder such as depression and/or anxiety, lack of energy, lack of motivation,  physical discomfort, as well as recurrent headaches, especially morning or nocturnal headaches. We talked about the importance of maintaining a healthy lifestyle and striving for healthy weight. In addition, we talked about the importance of striving for and maintaining good sleep hygiene.  She is encouraged to make more time for sleep.  I recommended a sleep study at this time. I outlined the differences between a laboratory attended sleep study which is considered more comprehensive and accurate over the option of a home sleep test (HST); the latter may lead to underestimation of sleep disordered breathing in some instances and does not help with diagnosing upper airway resistance syndrome and is not accurate enough to diagnose primary central sleep apnea typically. I outlined possible surgical and non-surgical treatment options of OSA, including the use of a positive airway pressure (PAP) device (i.e. CPAP, AutoPAP/APAP or BiPAP in certain circumstances), a custom-made dental device (aka oral appliance, which would require a referral to a specialist dentist or orthodontist typically, and is generally speaking not considered for patients with full dentures or edentulous state), upper airway surgical options, such as traditional UPPP (which is not considered a first-line treatment) or the Inspire device (hypoglossal nerve stimulator, which would involve a referral for consultation with an ENT surgeon, after careful selection, following inclusion criteria - also not first-line treatment). I explained the PAP treatment option to the patient in detail, as  this is generally considered first-line treatment.  The patient indicated that she would be willing to try PAP therapy, if the need arises. I explained the importance of being compliant with PAP treatment, not only for insurance purposes but primarily to improve patient's symptoms symptoms, and for the patient's long term health benefit, including to reduce Her cardiovascular risks longer-term.    We will pick up our discussion about the next steps and treatment options after testing.  We will keep her posted as to the test results by phone call and/or MyChart messaging where possible.  We will plan to follow-up in sleep clinic accordingly as well.  I answered all her questions today and the patient was in agreement.   I encouraged her to call with any interim questions, concerns, problems or updates or email [place] through MyChart.  Generally speaking, sleep test authorizations may take up to 2 weeks, sometimes less, sometimes longer, the patient is encouraged to get in touch with [place] if they do not hear back from the sleep lab staff directly within the next 2 weeks.  Thank you very much for allowing me to participate in the care of this nice patient. If I can be of any further assistance to you please do not hesitate to call me at [phone].  Sincerely,   Huston Foley, MD, PhD

## 2022-10-12 ENCOUNTER — Telehealth: Payer: Self-pay | Admitting: Family Medicine

## 2022-10-12 NOTE — Telephone Encounter (Signed)
FYI- patient wanted you to know she tested positive yesterday but feeling better today. She just has bad cough and how long she should she quarantine for.

## 2022-10-12 NOTE — Telephone Encounter (Signed)
Currently COVID is going around As long as she is feeling better she does not necessarily have to go on Paxlovid If she gets worse over the next 24 hours she should let us know I would recommend minimum of 5 days of self-isolation then after that for the following 5 days if she has to be around anyone to wear a mask when around others If she does not have to be around others it is reasonable to try to avoid being other places for 10 days  If progressive symptoms or worse would need to have follow-up

## 2022-10-13 ENCOUNTER — Ambulatory Visit (HOSPITAL_COMMUNITY): Payer: Medicare Other

## 2022-10-13 NOTE — Telephone Encounter (Signed)
Patient notified of provider's recommendations and verbalized understanding.  

## 2022-10-17 ENCOUNTER — Ambulatory Visit (INDEPENDENT_AMBULATORY_CARE_PROVIDER_SITE_OTHER): Payer: Medicare Other | Admitting: Neurology

## 2022-10-17 DIAGNOSIS — R351 Nocturia: Secondary | ICD-10-CM

## 2022-10-17 DIAGNOSIS — R0683 Snoring: Secondary | ICD-10-CM

## 2022-10-17 DIAGNOSIS — G4733 Obstructive sleep apnea (adult) (pediatric): Secondary | ICD-10-CM

## 2022-10-17 DIAGNOSIS — G4719 Other hypersomnia: Secondary | ICD-10-CM

## 2022-10-17 DIAGNOSIS — R030 Elevated blood-pressure reading, without diagnosis of hypertension: Secondary | ICD-10-CM

## 2022-10-17 DIAGNOSIS — Z9189 Other specified personal risk factors, not elsewhere classified: Secondary | ICD-10-CM

## 2022-10-18 NOTE — Progress Notes (Signed)
See procedure note.

## 2022-10-20 ENCOUNTER — Ambulatory Visit (HOSPITAL_COMMUNITY): Payer: Medicare Other

## 2022-10-20 ENCOUNTER — Other Ambulatory Visit (HOSPITAL_COMMUNITY): Payer: Medicare Other

## 2022-10-20 NOTE — Procedures (Signed)
   [facility]  HOME SLEEP TEST (Watch PAT) REPORT  STUDY DATE: 10/18/22  DOB: 1950-06-04  MRN: [id number]  ORDERING CLINICIAN: Huston Foley, MD, PhD   REFERRING CLINICIAN: Babs Sciara, MD   CLINICAL INFORMATION/HISTORY: 71 year old female with an underlying medical history of hypertension, hyperlipidemia, diabetes, and severe obesity with a BMI of over 40, who reports snoring and excessive daytime somnolence.   Epworth sleepiness score: 15/24.  BMI: 42.7 kg/m  FINDINGS:   Sleep Summary:   Total Recording Time (hours, min): 7 hours, 54 min  Total Sleep Time (hours, min):  7 hours, 0 min  Percent REM (%):    43.5%   Respiratory Indices:   Calculated pAHI (per hour):  15.3/hour         REM pAHI:    28.9/hour       NREM pAHI: 4.8/hour  Central pAHI: 0/hour  Oxygen Saturation Statistics:    Oxygen Saturation (%) Mean: 93%   Minimum oxygen saturation (%):                 82%   O2 Saturation Range (%): 82 - 97%    O2 Saturation (minutes) <=88%: 2.6 min  Pulse Rate Statistics:   Pulse Mean (bpm):    74/min    Pulse Range (56 - 110/min)   IMPRESSION: OSA (obstructive sleep apnea)   RECOMMENDATION:  This home sleep test demonstrates moderate obstructive sleep apnea with a total AHI of 15.3/hour and O2 nadir of 82%.  Mild to moderate snoring was detected. Treatment with a positive airway pressure (PAP) device is recommended. The patient will be advised to proceed with an autoPAP titration/trial at home for now. A full night titration study may be considered to optimize treatment settings, monitor proper oxygen saturations and aid with improvement of tolerance and adherence, if needed down the road. Alternative treatment options may include a dental device through dentistry or orthodontics in selected patients or Inspire (hypoglossal nerve stimulator) in carefully selected patients (meeting inclusion criteria).  Concomitant weight loss is  recommended (where clinically appropriate). Please note that untreated obstructive sleep apnea may carry additional perioperative morbidity. Patients with significant obstructive sleep apnea should receive perioperative PAP therapy and the surgeons and particularly the anesthesiologist should be informed of the diagnosis and the severity of the sleep disordered breathing. The patient should be cautioned not to drive, work at heights, or operate dangerous or heavy equipment when tired or sleepy. Review and reiteration of good sleep hygiene measures should be pursued with any patient. Other causes of the patient's symptoms, including circadian rhythm disturbances, an underlying mood disorder, medication effect and/or an underlying medical problem cannot be ruled out based on this test. Clinical correlation is recommended.  The patient and her referring provider will be notified of the test results. The patient will be seen in follow up in sleep clinic at [facility].  I certify that I have reviewed the raw data recording prior to the issuance of this report in accordance with the standards of the American Academy of Sleep Medicine (AASM).    INTERPRETING PHYSICIAN:   Huston Foley, MD, PhD Medical Director, [facility]) Diplomat, ABPN (Neurology and Sleep)   [facility] [address], [address] [place], [place] [address] [phone]

## 2022-10-20 NOTE — Addendum Note (Signed)
Addended by: Huston Foley on: 10/20/2022 02:14 PM   Modules accepted: Orders

## 2022-10-27 ENCOUNTER — Ambulatory Visit (HOSPITAL_COMMUNITY)
Admission: RE | Admit: 2022-10-27 | Discharge: 2022-10-27 | Disposition: A | Discharge status: Home / Self Care | Payer: Medicare Other | Source: Ambulatory Visit | Attending: Family Medicine | Admitting: Family Medicine

## 2022-10-27 ENCOUNTER — Encounter (HOSPITAL_COMMUNITY): Payer: Self-pay

## 2022-10-27 DIAGNOSIS — Z780 Asymptomatic menopausal state: Secondary | ICD-10-CM | POA: Diagnosis not present

## 2022-10-27 DIAGNOSIS — Z1231 Encounter for screening mammogram for malignant neoplasm of breast: Secondary | ICD-10-CM

## 2022-10-27 DIAGNOSIS — M8588 Other specified disorders of bone density and structure, other site: Secondary | ICD-10-CM | POA: Diagnosis not present

## 2022-10-28 ENCOUNTER — Encounter: Payer: Self-pay | Admitting: Family Medicine

## 2022-10-28 NOTE — Progress Notes (Signed)
Please mail to patient

## 2022-10-31 ENCOUNTER — Telehealth: Payer: Self-pay | Admitting: Neurology

## 2022-10-31 NOTE — Telephone Encounter (Signed)
Pt is asking if the results can be posted again to mychart for her sleep study, she states nothing shows regarding results.

## 2022-10-31 NOTE — Telephone Encounter (Signed)
Huston Foley, MD 10/20/2022  2:14 PM EDT Back to Top    Patient referred by PCP, seen by me on 09/18/22, patient had a HST on 10/18/22.     Please call and notify the patient that the recent home sleep test showed obstructive sleep apnea in the moderate range. I recommend treatment in the form of autoPAP, which means, that we don't have to bring her in for a sleep study with CPAP, but will let her start using a so called autoPAP machine at home, which is a CPAP-like machine with self-adjusting pressures. We will send the order to a local DME company (of her choice, or as per insurance requirement). The DME representative will fit her with a mask, educate her on how to use the machine, how to put the mask on, etc. I have placed an order in the chart. Please send the order, talk to patient, send report to referring MD. We will need a FU in sleep clinic for 10 weeks post-PAP set up, please arrange that with me or one of our NPs. Also reinforce the need for compliance with treatment. Thanks,   Huston Foley, MD, PhD [facility])

## 2022-11-01 NOTE — Telephone Encounter (Signed)
I called the patient back and I discussed her sleep study results as noted below by Dr. Frances Furbish.  The patient is amenable to proceeding with AutoPap therapy.  We discussed the insurance compliance requirements which includes using the machine at least 4 hours at night and also follow-up at our office between 30 and 90 days after set up.  The patient was scheduled for an initial follow-up on November 13 at 11:30 AM arrival at 11:00.  The patient's questions were answered.  She would prefer to use a DME company close to her so we are referring first to [employer].  Advised that if she finds out she will not be set up within the next few weeks, she can call us so we can refer to another company.  She is to expect a call from [place] within 1 week.  Referral faxed to [employer].  I placed another referral but if they are unable to set patient up within 2 to 3 weeks, let us know. Received a receipt of confirmation.  Patient would like a copy of the results mailed to her. She confirmed her home address on file is correct.

## 2022-11-29 ENCOUNTER — Other Ambulatory Visit: Payer: Self-pay | Admitting: Family Medicine

## 2022-12-14 DIAGNOSIS — Z23 Encounter for immunization: Secondary | ICD-10-CM | POA: Diagnosis not present

## 2023-01-08 ENCOUNTER — Telehealth: Payer: Self-pay

## 2023-01-08 NOTE — Telephone Encounter (Signed)
Reason for CRM: Patient calling has appointment tomorrow 11/12 wants to get labs ordered from provider to do beforehand at [facility]   Returned call to patient , she will pick up lab orders tomorrow at her appt.

## 2023-01-09 ENCOUNTER — Ambulatory Visit (INDEPENDENT_AMBULATORY_CARE_PROVIDER_SITE_OTHER): Payer: Medicare Other | Admitting: Family Medicine

## 2023-01-09 ENCOUNTER — Encounter: Payer: Self-pay | Admitting: Family Medicine

## 2023-01-09 VITALS — BP 138/82 | HR 77 | Ht 59.0 in | Wt 219.8 lb

## 2023-01-09 DIAGNOSIS — N1831 Chronic kidney disease, stage 3a: Secondary | ICD-10-CM

## 2023-01-09 DIAGNOSIS — Z79899 Other long term (current) drug therapy: Secondary | ICD-10-CM | POA: Diagnosis not present

## 2023-01-09 DIAGNOSIS — E785 Hyperlipidemia, unspecified: Secondary | ICD-10-CM

## 2023-01-09 DIAGNOSIS — R413 Other amnesia: Secondary | ICD-10-CM | POA: Diagnosis not present

## 2023-01-09 DIAGNOSIS — I10 Essential (primary) hypertension: Secondary | ICD-10-CM | POA: Diagnosis not present

## 2023-01-09 DIAGNOSIS — E1169 Type 2 diabetes mellitus with other specified complication: Secondary | ICD-10-CM | POA: Diagnosis not present

## 2023-01-09 DIAGNOSIS — E0822 Diabetes mellitus due to underlying condition with diabetic chronic kidney disease: Secondary | ICD-10-CM | POA: Diagnosis not present

## 2023-01-09 NOTE — Progress Notes (Signed)
Subjective:    Patient ID: [name], female    DOB: February 02, 1951, 72 y.o.   MRN: [id number]  Discussed the use of AI scribe software for clinical note transcription with the patient, who gave verbal consent to proceed.  History of Present Illness   The patient, with a history of hypertension and hyperlipidemia, presents with concerns about recent weight gain and memory issues. She reports an increase in weight due to overeating and cooking unhealthy foods, particularly sweets. Despite this, she expresses a commitment to healthier eating habits and is mindful of her current dietary choices.  The patient also reports issues with medication adherence due to forgetfulness. She uses a pill box for medication organization but often forgets to take her medications until later in the day. The patient's medications are kept in a visible location in the kitchen, and she is considering associating medication intake with her morning coffee routine to improve adherence.  The patient has been experiencing memory lapses, particularly with short-term memory. She reports instances of forgetting her intended destination while driving, missing turns, and driving past intended stops. She also reports difficulty recalling words during conversations and forgetting recent discussions. The patient has noticed these memory issues affecting her daily activities, such as forgetting to complete tasks assigned during church meetings. Despite these memory concerns, the patient is still able to manage her finances independently and engage in reading activities without difficulty.  The patient also reported a concerning incident where she left her car in drive when she went shopping, which was uncharacteristic and alarming for her. She continues to drive, but mostly during the day and within local areas, as she feels uncomfortable with long-distance and night driving.  The patient's mother had dementia in her late eighties,  which raises concerns for the patient about her own memory issues. However, she is open to further testing, including blood work and an MRI of the brain, to investigate these memory concerns further.         Review of Systems     Objective:    Physical Exam   VITALS: BP- 138/82 EXTREMITIES: Ankle edema present.     General-in no acute distress Eyes-no discharge Lungs-respiratory rate normal, CTA CV-no murmurs,RRR Extremities skin warm dry no edema Neuro grossly normal Behavior normal, alert       Assessment & Plan:  Assessment and Plan    Weight Gain Acknowledges overeating and cooking unhealthy meals. Plans to be more mindful of diet. -Encouraged to continue efforts to eat healthier.  Medication Adherence Reports forgetting to take medications, despite using a pill box. Medications are kept in a visible location. -Recommended to associate medication intake with morning coffee routine to improve adherence.  Cognitive Concerns Reports instances of forgetting words, missing turns while driving, and forgetting to complete tasks. Mother had dementia in her late 45s. Performed well on cognitive assessment, but showed some difficulty with short-term memory. -Order blood work to check thyroid function and B12 levels. -Order MRI of the brain to rule out mini strokes. -Encouraged to continue note-taking and reviewing notes to aid memory. -Follow-up in 4-6 weeks to reassess cognitive function.  Driving Safety Reports missing turns while driving, but is able to correct course. Prefers daytime driving and avoids long distances. -Advised to continue daytime driving and to stay mindful while driving.      1. Hyperlipidemia associated with type 2 diabetes mellitus (HCC) Continue medication check labs - Basic Metabolic Panel - Lipid panel  2. Essential hypertension, benign  Keep blood pressure under good control check labs - Basic Metabolic Panel  3. Diabetes mellitus due to  underlying condition with stage 3a chronic kidney disease, without long-term current use of insulin (HCC) Check lab work continue medication - Hemoglobin A1c  4. Memory loss, short term See discussion above, moderately concerning Needs further workup Need to rule out vascular issues and metabolic issues  - TSH + free T4 - Vitamin B12 - MR Brain Wo Contrast  5. High risk medication use Lab ordered - Hepatic Function Panel  Follow-up within 6 weeks for discussion of lab results and MRI hold on neurology consult currently  [facility] cognitive assessment 25 out of 30

## 2023-01-09 NOTE — Addendum Note (Signed)
Addended by: Lilyan Punt A on: 01/09/2023 12:50 PM   Modules accepted: Orders

## 2023-01-10 ENCOUNTER — Ambulatory Visit (INDEPENDENT_AMBULATORY_CARE_PROVIDER_SITE_OTHER): Payer: Medicare Other | Admitting: Adult Health

## 2023-01-10 ENCOUNTER — Encounter: Payer: Self-pay | Admitting: Adult Health

## 2023-01-10 VITALS — BP 175/68 | HR 69 | Ht 59.0 in | Wt 219.0 lb

## 2023-01-10 DIAGNOSIS — G4733 Obstructive sleep apnea (adult) (pediatric): Secondary | ICD-10-CM | POA: Diagnosis not present

## 2023-01-11 LAB — LIPID PANEL
Chol/HDL Ratio: 2.5 ratio (ref 0.0–4.4)
Cholesterol, Total: 135 mg/dL (ref 100–199)
HDL: 53 mg/dL (ref >39)
LDL Chol Calc (NIH): 68 mg/dL (ref 0–99)
Triglycerides: 69 mg/dL (ref 0–149)
VLDL Cholesterol Cal: 14 mg/dL (ref 5–40)

## 2023-01-11 LAB — BASIC METABOLIC PANEL
BUN/Creatinine Ratio: 19 (ref 12–28)
BUN: 19 mg/dL (ref 8–27)
CO2: 21 mmol/L (ref 20–29)
Calcium: 9.5 mg/dL (ref 8.7–10.3)
Chloride: 106 mmol/L (ref 96–106)
Creatinine, Ser: 0.98 mg/dL (ref 0.57–1.00)
Glucose: 106 mg/dL — ABNORMAL HIGH (ref 70–99)
Potassium: 4.4 mmol/L (ref 3.5–5.2)
Sodium: 141 mmol/L (ref 134–144)
eGFR: 61 mL/min/{1.73_m2} (ref >59)

## 2023-01-11 LAB — HEPATIC FUNCTION PANEL
ALT: 16 IU/L (ref 0–32)
AST: 21 [IU]/L (ref 0–40)
Albumin: 4.3 g/dL (ref 3.8–4.8)
Alkaline Phosphatase: 118 [IU]/L (ref 44–121)
Bilirubin Total: 0.5 mg/dL (ref 0.0–1.2)
Bilirubin, Direct: 0.18 mg/dL (ref 0.00–0.40)
Total Protein: 7 g/dL (ref 6.0–8.5)

## 2023-01-11 LAB — VITAMIN B12: Vitamin B-12: 403 pg/mL (ref 232–1245)

## 2023-01-11 LAB — HEMOGLOBIN A1C
Est. average glucose Bld gHb Est-mCnc: 160 mg/dL
Hgb A1c MFr Bld: 7.2 % — ABNORMAL HIGH (ref 4.8–5.6)

## 2023-01-11 LAB — TSH+FREE T4
Free T4: 1.14 ng/dL (ref 0.82–1.77)
TSH: 1.4 u[IU]/mL (ref 0.450–4.500)

## 2023-01-11 NOTE — Progress Notes (Signed)
Fax confirmation received for mask refit cpap [employer].

## 2023-01-13 ENCOUNTER — Ambulatory Visit (HOSPITAL_COMMUNITY)
Admission: RE | Admit: 2023-01-13 | Discharge: 2023-01-13 | Disposition: A | Discharge status: Home / Self Care | Payer: Medicare Other | Source: Ambulatory Visit | Attending: Family Medicine | Admitting: Family Medicine

## 2023-01-13 DIAGNOSIS — R413 Other amnesia: Secondary | ICD-10-CM | POA: Diagnosis not present

## 2023-01-13 DIAGNOSIS — Z8673 Personal history of transient ischemic attack (TIA), and cerebral infarction without residual deficits: Secondary | ICD-10-CM | POA: Diagnosis not present

## 2023-01-13 DIAGNOSIS — I6782 Cerebral ischemia: Secondary | ICD-10-CM | POA: Diagnosis not present

## 2023-01-29 ENCOUNTER — Encounter: Payer: Self-pay | Admitting: *Deleted

## 2023-01-29 ENCOUNTER — Telehealth: Payer: Self-pay | Admitting: *Deleted

## 2023-01-29 NOTE — Telephone Encounter (Signed)
Patient notified via mychart

## 2023-01-29 NOTE — Telephone Encounter (Signed)
Copied from CRM [id number]. Topic: Clinical - Lab/Test Results >> Jan 29, 2023 10:45 AM Orinda Kenner C wrote: Reason for CRM: Pt wants MRI 01/13/2023 results, pls c/b [phone]

## 2023-01-29 NOTE — Telephone Encounter (Signed)
Nurses Due to [employer] of radiologist, interpretation is running typically 3 weeks behind hopefully results will come back within the next 2 weeks if they do not please have patient notify us and we will search further We are at their [facility] thank you  I did look at the images I do not see any obvious tumor or bleeds but the radiologist far certainly more skilled that reading this when we get final results back we will reach out

## 2023-02-13 ENCOUNTER — Ambulatory Visit (INDEPENDENT_AMBULATORY_CARE_PROVIDER_SITE_OTHER): Payer: Medicare Other | Admitting: Family Medicine

## 2023-02-13 VITALS — BP 130/80 | Ht 59.0 in | Wt 219.0 lb

## 2023-02-13 DIAGNOSIS — Z7984 Long term (current) use of oral hypoglycemic drugs: Secondary | ICD-10-CM | POA: Diagnosis not present

## 2023-02-13 DIAGNOSIS — E785 Hyperlipidemia, unspecified: Secondary | ICD-10-CM | POA: Diagnosis not present

## 2023-02-13 DIAGNOSIS — E1169 Type 2 diabetes mellitus with other specified complication: Secondary | ICD-10-CM | POA: Diagnosis not present

## 2023-02-13 DIAGNOSIS — I10 Essential (primary) hypertension: Secondary | ICD-10-CM | POA: Diagnosis not present

## 2023-02-13 DIAGNOSIS — N1831 Chronic kidney disease, stage 3a: Secondary | ICD-10-CM

## 2023-02-13 DIAGNOSIS — E0822 Diabetes mellitus due to underlying condition with diabetic chronic kidney disease: Secondary | ICD-10-CM

## 2023-02-13 NOTE — Progress Notes (Signed)
Subjective:    Patient ID: [name], female    DOB: 1950/06/05, 72 y.o.   MRN: [id number]  Discussed the use of AI scribe software for clinical note transcription with the patient, who gave verbal consent to proceed.  History of Present Illness   The patient, with a history of diabetes, presented for a routine follow-up. She reported no new symptoms or health concerns. However, lab results indicated a gradual increase in her A1c levels over the past year, from 6.3 to the current 7.2. Despite this, the patient's other lab results, including sodium, potassium, calcium, cholesterol profile, liver enzymes, and thyroid function, were all within normal ranges.  The patient underwent an MRI to monitor her diabetes and assess potential effects on her cognitive function. The MRI report suggested evidence of a small stroke that likely occurred in 2018, which was not previously documented. The patient did not recall experiencing any symptoms of a stroke. The MRI also showed moderate chronic small vessel ischemic changes, likely related to aging and her diabetes.  The patient expressed concerns about her memory, although she did not provide specific examples of memory issues. She reported a family history of Alzheimer's and memory problems, with relatives typically showing changes in their 31s. The patient opted to monitor her memory issues rather than seek immediate specialist consultation.  Regarding her diabetes management, the patient reported that her current medication, Metformin 500mg  once daily, caused gastrointestinal discomfort, including bloating and gas. She expressed a desire to manage her diabetes through dietary measures rather than increasing her medication dosage or adding a new medication.         Review of Systems     Objective:    Physical Exam   CHEST: Breath sounds clear CARDIOVASCULAR: Heart sounds normal     General-in no acute distress Eyes-no  discharge Lungs-respiratory rate normal, CTA CV-no murmurs,RRR Extremities skin warm dry no edema Neuro grossly normal Behavior normal, alert       Assessment & Plan:  Assessment and Plan    Type 2 Diabetes Mellitus A1c has been gradually increasing over the past year, currently at 7.2. Patient reports gastrointestinal side effects with Metformin 500mg  daily. Discussed the option of increasing Metformin dose or adding a GLP-1 agonist like Ozempic or Mounjaro, but patient prefers to manage with lifestyle modifications at this time. -Continue Metformin 500mg  daily. -Encourage dietary modifications and regular exercise. -Repeat A1c in April 2025.  Cognitive Concerns Patient has concerns about memory issues. MRI shows moderate chronic small vessel ischemic changes, but no acute or subacute infarction. There is a discrepancy in the radiology report regarding a small stroke in 2018, which needs to be clarified with the radiologist. Discussed the option of a neurology consult for further cognitive evaluation, but patient prefers to monitor at this time. -Clarify with radiologist regarding the reported small stroke in 2018. -Monitor cognitive status and reassess in 3-4 months.  General Health Maintenance -Continue current medications. -Encourage regular exercise and healthy diet. -Follow-up appointment in April 2025 with labs prior to visit.     1. Diabetes mellitus due to underlying condition with stage 3a chronic kidney disease, without long-term current use of insulin (HCC) (Primary) Patient will work hard on dietary measures activity and repeat labs again in approximately 4 months hold off on GLP-1's at this time - Basic metabolic panel - Hemoglobin A1c  2. Hyperlipidemia associated with type 2 diabetes mellitus (HCC) Keep LDL below 70 continue medication  3. Essential hypertension, benign Keep blood  pressure under good control - Basic metabolic panel   Cognitive issues-stable  currently patient does not want neurology consult currently It is curious that the radiologist stated that on the most recent MRI there is a stroke where is on previous MRI they did not state there was a stroke I will try to connect with neuroradiologist to try to get their input regarding this issue

## 2023-02-23 ENCOUNTER — Ambulatory Visit (INDEPENDENT_AMBULATORY_CARE_PROVIDER_SITE_OTHER): Payer: Medicare Other

## 2023-02-23 DIAGNOSIS — Z0000 Encounter for general adult medical examination without abnormal findings: Secondary | ICD-10-CM | POA: Diagnosis not present

## 2023-02-23 NOTE — Patient Instructions (Signed)
Ms. Holley , Thank you for taking time to come for your Medicare Wellness Visit. I appreciate your ongoing commitment to your health goals. Please review the following plan we discussed and let me know if I can assist you in the future.   Referrals/Orders/Follow-Ups/Clinician Recommendations: none  This is a list of the screening recommended for you and due dates:  Health Maintenance  Topic Date Due   DTaP/Tdap/Td vaccine (1 - Tdap) Never done   Colon Cancer Screening  07/12/2022   COVID-19 Vaccine (6 - 2024-25 season) 02/08/2023   Eye exam for diabetics  04/27/2023   Hemoglobin A1C  07/09/2023   Yearly kidney health urinalysis for diabetes  08/04/2023   Complete foot exam   08/09/2023   Mammogram  10/27/2023   Yearly kidney function blood test for diabetes  01/09/2024   Medicare Annual Wellness Visit  02/23/2024   Pneumonia Vaccine  Completed   Flu Shot  Completed   DEXA scan (bone density measurement)  Completed   Hepatitis C Screening  Completed   Zoster (Shingles) Vaccine  Completed   HPV Vaccine  Aged Out    Advanced directives: (Declined) Advance directive discussed with you today. Even though you declined this today, please call our office should you change your mind, and we can give you the proper paperwork for you to fill out.  Next Medicare Annual Wellness Visit scheduled for next year: Yes  Insert Preventive Care attachment Insert FALL PREVENTION attachment if needed

## 2023-02-23 NOTE — Progress Notes (Signed)
Subjective:   [name] is a 72 y.o. female who presents for Medicare Annual (Subsequent) preventive examination.  Visit Complete: Virtual I connected with  Levora Dredge on 02/23/23 by a audio enabled telemedicine application and verified that I am speaking with the correct person using two identifiers.  Patient Location: Home  Provider Location: Office/Clinic  I discussed the limitations of evaluation and management by telemedicine. The patient expressed understanding and agreed to proceed.  Vital Signs: Because this visit was a virtual/telehealth visit, some criteria may be missing or patient reported. Any vitals not documented were not able to be obtained and vitals that have been documented are patient reported.  Patient Medicare AWV questionnaire was completed by the patient on 02/19/2023; I have confirmed that all information answered by patient is correct and no changes since this date.  Cardiac Risk Factors include: advanced age (>63men, >88 women);diabetes mellitus;dyslipidemia;hypertension     Objective:    Today's Vitals   There is no height or weight on file to calculate BMI.     02/23/2023    9:12 AM 02/17/2022    9:23 AM 12/28/2020    2:11 PM 12/08/2020    2:25 PM 02/13/2020    7:15 AM 02/11/2020    8:53 AM 01/28/2020   11:15 AM  Advanced Directives  Does Patient Have a Medical Advance Directive? No No No No No No No  Would patient like information on creating a medical advance directive? No - Patient declined Yes (MAU/Ambulatory/Procedural Areas - Information given) No - Patient declined Yes (MAU/Ambulatory/Procedural Areas - Information given) No - Patient declined No - Patient declined No - Patient declined    Current Medications (verified) Outpatient Encounter Medications as of 02/23/2023  Medication Sig   blood glucose meter kit and supplies Dispense based on patient and insurance preference. Use to check blood sugars once daily . (FOR ICD-10  E10.9, E11.9).   glucose blood (ACCU-CHEK GUIDE) test strip TEST BLOOD SUGAR ONCE DAILY (E11.9)   irbesartan (AVAPRO) 150 MG tablet Take 1 tablet (150 mg total) by mouth daily.   rosuvastatin (CRESTOR) 40 MG tablet TAKE 1 TABLET BY MOUTH EVERY DAY   metFORMIN (GLUCOPHAGE-XR) 500 MG 24 hr tablet Take 1 tablet (500 mg total) by mouth daily with breakfast. (Patient not taking: Reported on 02/23/2023)   No facility-administered encounter medications on file as of 02/23/2023.    Allergies (verified) Farxiga [dapagliflozin]   History: Past Medical History:  Diagnosis Date   Arthritis    Diabetes mellitus without complication (HCC)    Hyperlipidemia    Hypertension    Mild sleep apnea    Prediabetes    Prolonged QT interval    Renal disorder    Past Surgical History:  Procedure Laterality Date   ABDOMINAL HYSTERECTOMY     CHOLECYSTECTOMY     COLON SURGERY     Colonoscopy   COLONOSCOPY     COLONOSCOPY N/A 07/11/2017   Procedure: COLONOSCOPY;  Surgeon: Corbin Ade, MD;  Location: [facility];  Service: Endoscopy;  Laterality: N/A;  8:30   EYE SURGERY     Cataracts   TUBAL LIGATION     Family History  Problem Relation Age of Onset   Heart disease Mother    Diabetes Mother    Arthritis Father    Cancer Father        prostate   Diabetes Maternal Grandmother    Arthritis Sister    Sleep apnea Neg Hx  Social History   Socioeconomic History   Marital status: Married    Spouse name: Oswaldo Done   Number of children: 2   Years of education: Not on file   Highest education level: GED or equivalent  Occupational History   Not on file  Tobacco Use   Smoking status: Former    Current packs/day: 0.00    Types: Cigarettes    Quit date: 06/17/1992    Years since quitting: 30.7   Smokeless tobacco: Never   Tobacco comments:    less than a pack a week  Vaping Use   Vaping status: Never Used  Substance and Sexual Activity   Alcohol use: Never   Drug use: Never   Sexual  activity: Not Currently    Birth control/protection: None  Other Topics Concern   Not on file  Social History Narrative   3 grandchildren and 2 great grandchildren.   Social Drivers of Corporate investment banker Strain: Low Risk  (02/23/2023)   Overall Financial Resource Strain (CARDIA)    Difficulty of Paying Living Expenses: Not hard at all  Food Insecurity: No Food Insecurity (02/23/2023)   Hunger Vital Sign    Worried About Running Out of Food in the Last Year: Never true    Ran Out of Food in the Last Year: Never true  Transportation Needs: No Transportation Needs (02/23/2023)   PRAPARE - Administrator, Civil Service (Medical): No    Lack of Transportation (Non-Medical): No  Physical Activity: Inactive (02/23/2023)   Exercise Vital Sign    Days of Exercise per Week: 0 days    Minutes of Exercise per Session: 0 min  Stress: No Stress Concern Present (02/23/2023)   [employer] of Occupational Health - Occupational Stress Questionnaire    Feeling of Stress : Not at all  Social Connections: Socially Integrated (02/23/2023)   Social Connection and Isolation Panel [NHANES]    Frequency of Communication with Friends and Family: More than three times a week    Frequency of Social Gatherings with Friends and Family: Once a week    Attends Religious Services: More than 4 times per year    Active Member of [employer] or Organizations: Yes    Attends Engineer, structural: More than 4 times per year    Marital Status: Married    Tobacco Counseling Counseling given: Not Answered Tobacco comments: less than a pack a week   Clinical Intake:  Pre-visit preparation completed: Yes  Pain : No/denies pain     Nutritional Risks: None Diabetes: Yes CBG done?: No Did pt. bring in CBG monitor from home?: No  How often do you need to have someone help you when you read instructions, pamphlets, or other written materials from your doctor or pharmacy?: 1 -  Never  Interpreter Needed?: No  Information entered by :: NAllen LPN   Activities of Daily Living    02/19/2023    6:29 PM  In your present state of health, do you have any difficulty performing the following activities:  Hearing? 0  Vision? 0  Difficulty concentrating or making decisions? 0  Walking or climbing stairs? 0  Dressing or bathing? 0  Doing errands, shopping? 0  Preparing Food and eating ? N  Using the Toilet? N  In the past six months, have you accidently leaked urine? Y  Do you have problems with loss of bowel control? N  Managing your Medications? N  Managing your Finances? N  Housekeeping  or managing your Housekeeping? N    Patient Care Team: Babs Sciara, MD as PCP - General (Family Medicine) Jena Gauss Gerrit Friends, MD as Consulting Physician (Gastroenterology) Gavin Pound, [facility] (Inactive) (Pharmacist)  Indicate any recent Medical Services you may have received from other than [facility] providers in the past year (date may be approximate).     Assessment:   This is a routine wellness examination for De[place].  Hearing/Vision screen Hearing Screening - Comments:: Denies hearing issues Vision Screening - Comments:: Regular eye exams, Dr. Charise Killian   Goals Addressed             This Visit's Progress    Patient Stated       02/23/2023, wants to lose weight       Depression Screen    02/23/2023    9:14 AM 01/09/2023   11:57 AM 08/09/2022   10:51 AM 06/01/2022    2:41 PM 02/17/2022    9:22 AM 12/28/2020    2:09 PM 12/13/2020    1:11 PM  PHQ 2/9 Scores  PHQ - 2 Score 0 1 0 2 0 0 0  PHQ- 9 Score 0 4 2 3        Fall Risk    02/19/2023    6:29 PM 01/09/2023   10:33 AM 08/09/2022   10:50 AM 02/17/2022    9:21 AM 02/14/2022   12:49 AM  Fall Risk   Falls in the past year? 0 0 0 0 0  Number falls in past yr: 0 0 0 0 0  Injury with Fall? 0 0 0 0 0  Risk for fall due to : Medication side effect   No Fall Risks   Follow up Falls prevention  discussed;Falls evaluation completed   Falls evaluation completed;Education provided;Falls prevention discussed     MEDICARE RISK AT HOME: Medicare Risk at Home Any stairs in or around the home?: (Patient-Rptd) Yes If so, are there any without handrails?: (Patient-Rptd) Yes Home free of loose throw rugs in walkways, pet beds, electrical cords, etc?: (Patient-Rptd) Yes Adequate lighting in your home to reduce risk of falls?: (Patient-Rptd) Yes Life alert?: (Patient-Rptd) No Use of a cane, walker or w/c?: (Patient-Rptd) No Grab bars in the bathroom?: (Patient-Rptd) Yes Shower chair or bench in shower?: (Patient-Rptd) Yes Elevated toilet seat or a handicapped toilet?: (Patient-Rptd) Yes  TIMED UP AND GO:  Was the test performed?  No    Cognitive Function:      01/11/2023    9:03 AM  Montreal Cognitive Assessment   Visuospatial/ Executive (0/5) 4  Naming (0/3) 3  Attention: Read list of digits (0/2) 2  Attention: Read list of letters (0/1) 1  Attention: Serial 7 subtraction starting at 100 (0/3) 3  Language: Repeat phrase (0/2) 2  Language : Fluency (0/1) 1  Abstraction (0/2) 2  Delayed Recall (0/5) 0  Orientation (0/6) 6  Total 24  Adjusted Score (based on education) 25      02/23/2023    9:14 AM 02/17/2022    9:23 AM  6CIT Screen  What Year? 0 points 0 points  What month? 0 points 0 points  What time? 0 points 0 points  Count back from 20 0 points 0 points  Months in reverse 0 points 0 points  Repeat phrase 0 points 0 points  Total Score 0 points 0 points    Immunizations Immunization History  Administered Date(s) Administered   Fluad Quad(high Dose 65+) 12/05/2019, 12/14/2021   Influenza, High Dose  Seasonal PF 01/11/2021, 12/14/2022   Influenza, Seasonal, Injecte, Preservative Fre 12/06/2015   Influenza,inj,Quad PF,6+ Mos 01/11/2015, 01/03/2017, 11/09/2017, 11/12/2018   Influenza-Unspecified 12/14/2021   Moderna SARS-COV2 Booster Vaccination 02/05/2020    Moderna Sars-Covid-2 Vaccination 04/05/2019, 05/06/2019, 01/11/2021   Pneumococcal Conjugate-13 10/05/2014   Pneumococcal Polysaccharide-23 12/06/2015   Unspecified SARS-COV-2 Vaccination 12/14/2021   Zoster Recombinant(Shingrix) 06/09/2021, 12/14/2021    TDAP status: Due, Education has been provided regarding the importance of this vaccine. Advised may receive this vaccine at local pharmacy or Health Dept. Aware to provide a copy of the vaccination record if obtained from local pharmacy or Health Dept. Verbalized acceptance and understanding.  Flu Vaccine status: Up to date  Pneumococcal vaccine status: Up to date  Covid-19 vaccine status: Completed vaccines  Qualifies for Shingles Vaccine? Yes   Zostavax completed Yes   Shingrix Completed?: Yes  Screening Tests Health Maintenance  Topic Date Due   DTaP/Tdap/Td (1 - Tdap) Never done   Colonoscopy  07/12/2022   COVID-19 Vaccine (6 - 2024-25 season) 02/08/2023   OPHTHALMOLOGY EXAM  04/27/2023   HEMOGLOBIN A1C  07/09/2023   Diabetic kidney evaluation - Urine ACR  08/04/2023   FOOT EXAM  08/09/2023   MAMMOGRAM  10/27/2023   Diabetic kidney evaluation - eGFR measurement  01/09/2024   Medicare Annual Wellness (AWV)  02/23/2024   Pneumonia Vaccine 55+ Years old  Completed   INFLUENZA VACCINE  Completed   DEXA SCAN  Completed   Hepatitis C Screening  Completed   Zoster Vaccines- Shingrix  Completed   HPV VACCINES  Aged Out    Health Maintenance  Health Maintenance Due  Topic Date Due   DTaP/Tdap/Td (1 - Tdap) Never done   Colonoscopy  07/12/2022   COVID-19 Vaccine (6 - 2024-25 season) 02/08/2023    Colorectal cancer screening: Type of screening: Colonoscopy. Completed 07/11/2017. Repeat every 5 years  Mammogram status: Completed 10/27/2022. Repeat every year  Bone Density status: Completed 10/27/2022.   Lung Cancer Screening: (Low Dose CT Chest recommended if Age 41-80 years, 20 pack-year currently smoking OR have quit  w/in 15years.) does not qualify.   Lung Cancer Screening Referral: no  Additional Screening:  Hepatitis C Screening: does qualify; Completed 11/01/2016  Vision Screening: Recommended annual ophthalmology exams for early detection of glaucoma and other disorders of the eye. Is the patient up to date with their annual eye exam?  Yes  Who is the provider or what is the name of the office in which the patient attends annual eye exams? Dr. Charise Killian If pt is not established with a provider, would they like to be referred to a provider to establish care? No .   Dental Screening: Recommended annual dental exams for proper oral hygiene  Diabetic Foot Exam: Diabetic Foot Exam: Completed 08/09/2022  Community Resource Referral / Chronic Care Management: CRR required this visit?  No   CCM required this visit?  No     Plan:     I have personally reviewed and noted the following in the patient's chart:   Medical and social history Use of alcohol, tobacco or illicit drugs  Current medications and supplements including opioid prescriptions. Patient is not currently taking opioid prescriptions. Functional ability and status Nutritional status Physical activity Advanced directives List of other physicians Hospitalizations, surgeries, and ER visits in previous 12 months Vitals Screenings to include cognitive, depression, and falls Referrals and appointments  In addition, I have reviewed and discussed with patient certain preventive protocols, quality metrics, and best  practice recommendations. A written personalized care plan for preventive services as well as general preventive health recommendations were provided to patient.     Barb Merino, LPN   [id number]   After Visit Summary: (MyChart) Due to this being a telephonic visit, the after visit summary with patients personalized plan was offered to patient via MyChart   Nurse Notes: none

## 2023-03-31 ENCOUNTER — Other Ambulatory Visit: Payer: Self-pay | Admitting: Family Medicine

## 2023-03-31 DIAGNOSIS — I10 Essential (primary) hypertension: Secondary | ICD-10-CM

## 2023-04-02 ENCOUNTER — Other Ambulatory Visit: Payer: Self-pay

## 2023-04-02 DIAGNOSIS — I10 Essential (primary) hypertension: Secondary | ICD-10-CM

## 2023-04-02 MED ORDER — IRBESARTAN 150 MG PO TABS: 150.0000 mg | ORAL_TABLET | Freq: Every day | ORAL | 1 refills | Status: DC

## 2023-04-03 ENCOUNTER — Telehealth: Payer: Self-pay | Admitting: Family Medicine

## 2023-04-03 NOTE — Telephone Encounter (Signed)
 Nurses Patient has a complex history She had a MRI completed recently which mention the possibility of a old stroke The previous MRI did not mention that I told the patient at that time that I would speak with the radiologist who read the most recent MRI Please tell the patient of the following #1 I did speak with the neuroradiologist as I stated I would #2-that neuroradiologist states that they do see evidence of a small stroke that is stable-they state that this area is very small 3.  Because of this patient does need to have carotid ultrasound ordered #4 we also recommend starting 81 mg aspirin each day #5 it is also recommended for her to do an echo to look at the heart to make sure we are not seeing any valvular issues that could predispose a person to increased risk of stroke 6.   that in regards to the patient's blood work I would like for her to do this toward the end of February and lets set her up for a follow-up office visit late February to discuss the labs plus also discussed the approach toward this situation in greater detail Blood work is already ordered  She does have a follow-up visit in April but let her know that we will do this in February instead then we will move forward with doing a follow-up later towards summertime sooner if any other issues  I realize that this is a complex issue and that is why I would like to have her come in in February after she does her blood work so we can discuss all of this in greater detail  Also lab work would be February 14 or later due to insurance typically will not cover A1c unless it has been 3 months since her previous  If any questions let me know Otherwise place her into my schedule later in February thank you

## 2023-04-06 NOTE — Telephone Encounter (Signed)
 Spoke with pt and informed per all detailed messages, she will also have this message sent via mychart for her convenience. She does want to move forward with the tests mentioned to discuss at appt in late February, she will have labs done after Feb 14 Please work patient into schedule in late February per dr request, thanks.

## 2023-04-09 NOTE — Telephone Encounter (Signed)
 Nurses Due to history of stroke please do the following 1.  Set up carotid ultrasound 2.  Set up echo 3.  Please put on her med list 81 mg aspirin 1 daily 4.  Please try to work into my schedule by the end of February  Please see previous messages

## 2023-04-12 ENCOUNTER — Other Ambulatory Visit: Payer: Self-pay

## 2023-04-12 DIAGNOSIS — R9089 Other abnormal findings on diagnostic imaging of central nervous system: Secondary | ICD-10-CM

## 2023-04-12 DIAGNOSIS — R413 Other amnesia: Secondary | ICD-10-CM

## 2023-04-13 ENCOUNTER — Other Ambulatory Visit: Payer: Self-pay | Admitting: Nurse Practitioner

## 2023-04-13 ENCOUNTER — Other Ambulatory Visit: Payer: Self-pay

## 2023-04-13 ENCOUNTER — Telehealth: Payer: Self-pay | Admitting: Family Medicine

## 2023-04-13 DIAGNOSIS — I6340 Cerebral infarction due to embolism of unspecified cerebral artery: Secondary | ICD-10-CM

## 2023-04-13 DIAGNOSIS — Z8673 Personal history of transient ischemic attack (TIA), and cerebral infarction without residual deficits: Secondary | ICD-10-CM

## 2023-04-13 DIAGNOSIS — R0609 Other forms of dyspnea: Secondary | ICD-10-CM

## 2023-04-13 NOTE — Telephone Encounter (Signed)
Order has been placed.

## 2023-04-13 NOTE — Telephone Encounter (Signed)
Nurses I sent you a telephone message regarding this situation Please see that message thank you

## 2023-04-13 NOTE — Telephone Encounter (Signed)
Nurses I would order echo for this patient with diagnosis of embolic stroke  If for some reason the epic system will not allow for that then I would order it for DOE Then put within the notations that the patient has had a embolic stroke  Hopefully that will allow you to order the echo  Please see MyChart message thank you

## 2023-04-17 DIAGNOSIS — I10 Essential (primary) hypertension: Secondary | ICD-10-CM | POA: Diagnosis not present

## 2023-04-17 DIAGNOSIS — E0822 Diabetes mellitus due to underlying condition with diabetic chronic kidney disease: Secondary | ICD-10-CM | POA: Diagnosis not present

## 2023-04-17 DIAGNOSIS — N1831 Chronic kidney disease, stage 3a: Secondary | ICD-10-CM | POA: Diagnosis not present

## 2023-04-17 NOTE — Telephone Encounter (Signed)
Patient scheduled for follow up with Dr Lorin Picket 04/25/23 at 2:20pm

## 2023-04-18 ENCOUNTER — Encounter: Payer: Self-pay | Admitting: Family Medicine

## 2023-04-18 LAB — HEMOGLOBIN A1C
Est. average glucose Bld gHb Est-mCnc: 157 mg/dL
Hgb A1c MFr Bld: 7.1 % — ABNORMAL HIGH (ref 4.8–5.6)

## 2023-04-18 LAB — BASIC METABOLIC PANEL
BUN/Creatinine Ratio: 20 (ref 12–28)
BUN: 19 mg/dL (ref 8–27)
CO2: 20 mmol/L (ref 20–29)
Calcium: 9.1 mg/dL (ref 8.7–10.3)
Chloride: 108 mmol/L — ABNORMAL HIGH (ref 96–106)
Creatinine, Ser: 0.93 mg/dL (ref 0.57–1.00)
Glucose: 131 mg/dL — ABNORMAL HIGH (ref 70–99)
Potassium: 4.2 mmol/L (ref 3.5–5.2)
Sodium: 143 mmol/L (ref 134–144)
eGFR: 65 mL/min/{1.73_m2} (ref >59)

## 2023-04-25 ENCOUNTER — Ambulatory Visit (INDEPENDENT_AMBULATORY_CARE_PROVIDER_SITE_OTHER): Payer: Medicare Other | Admitting: Family Medicine

## 2023-04-25 ENCOUNTER — Encounter: Payer: Self-pay | Admitting: Family Medicine

## 2023-04-25 VITALS — BP 151/81 | HR 91 | Temp 97.9°F | Ht 59.0 in | Wt 220.0 lb

## 2023-04-25 DIAGNOSIS — N1831 Chronic kidney disease, stage 3a: Secondary | ICD-10-CM | POA: Diagnosis not present

## 2023-04-25 DIAGNOSIS — I10 Essential (primary) hypertension: Secondary | ICD-10-CM

## 2023-04-25 DIAGNOSIS — E1169 Type 2 diabetes mellitus with other specified complication: Secondary | ICD-10-CM | POA: Diagnosis not present

## 2023-04-25 DIAGNOSIS — E785 Hyperlipidemia, unspecified: Secondary | ICD-10-CM | POA: Diagnosis not present

## 2023-04-25 DIAGNOSIS — R000 Tachycardia, unspecified: Secondary | ICD-10-CM

## 2023-04-25 DIAGNOSIS — Z8673 Personal history of transient ischemic attack (TIA), and cerebral infarction without residual deficits: Secondary | ICD-10-CM | POA: Diagnosis not present

## 2023-04-25 DIAGNOSIS — R0609 Other forms of dyspnea: Secondary | ICD-10-CM | POA: Diagnosis not present

## 2023-04-25 MED ORDER — AMLODIPINE BESYLATE 2.5 MG PO TABS: 2.5000 mg | ORAL_TABLET | Freq: Every day | ORAL | 3 refills | Status: DC

## 2023-04-25 NOTE — Progress Notes (Signed)
 Subjective:    Patient ID: [name], female    DOB: 06/16/1950, 73 y.o.   MRN: [id number]  Discussed the use of AI scribe software for clinical note transcription with the patient, who gave verbal consent to proceed.  History of Present Illness   [name] is a 73 year old female with hypertension and diabetes who presents with heart palpitations and exercise intolerance.  For the past three to six months, she has experienced heart palpitations and an increased heart rate upon exertion. These symptoms are accompanied by shortness of breath and occur even with minimal exertion, such as getting out of bed or going to the store. She often needs to sit down and rest during activities like cleaning or cooking due to feeling 'out of breath' and her heart 'beating fast'.  She has a history of hypertension and diabetes. Currently, she is taking lisinopril and irbesartan for blood pressure management. She was prescribed metformin for diabetes but discontinued it due to gastrointestinal side effects, specifically bloating without nausea or diarrhea. Her last A1c was 7.1, slightly above the target of less than 7. She is making efforts towards healthy eating but has not been able to tolerate metformin due to side effects.  There is a family history of cardiovascular issues. Her mother had carotid artery problems, possibly requiring surgical intervention, and her sister had a stent placed in her heart. Her brother also had a recent cardiac procedure.         Review of Systems     Objective:    Physical Exam     General-in no acute distress Eyes-no discharge Lungs-respiratory rate normal, CTA CV-no murmurs,RRR Extremities skin warm dry no edema Neuro grossly normal Behavior normal, alert           Assessment & Plan:  Assessment and Plan    Cerebrovascular Accident (CVA) Small, asymptomatic stroke identified on MRI. Discussed the importance of stroke prevention. -Order  carotid ultrasound and echocardiogram to assess for potential sources of emboli. -Refer to cardiology for further evaluation and possible heart rhythm monitoring.  Hypertension Blood pressure higher than target. Currently on Lisinopril and Irbesartan. -Recheck blood pressure and adjust medications as needed.  Type 2 Diabetes Mellitus A1c of 7.1, slightly above target. Patient reports intolerance to Metformin due to bloating. -Discussed alternative medications including Januvia, Jardiance, and GLP-1 agonists (Trulicity, Ozempic). Will check insurance coverage and costs. -Encourage continued healthy eating and lifestyle modifications.  Palpitations and Shortness of Breath New onset, occurring over the past 3-6 months. No associated chest pain. -Order EKG today. -Refer to cardiology for further evaluation.  Lower Extremity Edema Unilateral leg swelling reported. -Assess for possible causes including venous insufficiency or heart failure.  General Health Maintenance -Continue current medications including Lisinopril, Irbesartan, and Metformin as tolerated. -Follow up after completion of ordered tests and specialist evaluations.      1. DOE (dyspnea on exertion) (Primary) Given her shortness of breath with some activity along with unusual palpitation spells she would benefit from cardiology consultation she has a strong potential risk for underlying coronary artery disease I would recommend evaluation to rule that out as well as telemetry  We have already set her up with an echo it should be noted that she did have a small stroke please see above discussion echo pending  2. Hyperlipidemia associated with type 2 diabetes mellitus (HCC) With a history of a stroke we are doing a carotid artery Doppler study - US Carotid Duplex Bilateral  3. Essential hypertension, benign Blood pressure goal is to get blood pressure under better control she agrees to start low-dose amlodipine she will  follow-up in the near future  4. 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 Tachycardia Referral to cardiology no tachycardia currently but patient has intermittent palpitations please see discussion above - EKG 12-Lead  6. History of stroke Doppler study for carotid as part of the workup - US Carotid Duplex Bilateral  She has a follow-up office visit in April we will recheck blood pressure at that time  We will also have her see cardiology

## 2023-04-26 ENCOUNTER — Other Ambulatory Visit: Payer: Self-pay

## 2023-04-26 DIAGNOSIS — R002 Palpitations: Secondary | ICD-10-CM

## 2023-04-26 DIAGNOSIS — R0609 Other forms of dyspnea: Secondary | ICD-10-CM

## 2023-05-01 ENCOUNTER — Ambulatory Visit (HOSPITAL_COMMUNITY)
Admission: RE | Admit: 2023-05-01 | Discharge: 2023-05-01 | Disposition: A | Discharge status: Home / Self Care | Source: Ambulatory Visit | Attending: Family Medicine | Admitting: Family Medicine

## 2023-05-01 DIAGNOSIS — E785 Hyperlipidemia, unspecified: Secondary | ICD-10-CM | POA: Diagnosis not present

## 2023-05-01 DIAGNOSIS — I672 Cerebral atherosclerosis: Secondary | ICD-10-CM | POA: Diagnosis not present

## 2023-05-01 DIAGNOSIS — E1169 Type 2 diabetes mellitus with other specified complication: Secondary | ICD-10-CM | POA: Diagnosis not present

## 2023-05-01 DIAGNOSIS — Z8673 Personal history of transient ischemic attack (TIA), and cerebral infarction without residual deficits: Secondary | ICD-10-CM | POA: Diagnosis not present

## 2023-05-02 ENCOUNTER — Ambulatory Visit (HOSPITAL_COMMUNITY)
Admission: RE | Admit: 2023-05-02 | Discharge: 2023-05-02 | Disposition: A | Discharge status: Home / Self Care | Payer: Medicare Other | Source: Ambulatory Visit | Attending: Family Medicine | Admitting: Family Medicine

## 2023-05-02 ENCOUNTER — Encounter: Payer: Self-pay | Admitting: Family Medicine

## 2023-05-02 ENCOUNTER — Ambulatory Visit (HOSPITAL_COMMUNITY): Admission: RE | Admit: 2023-05-02 | Source: Ambulatory Visit

## 2023-05-02 DIAGNOSIS — I6340 Cerebral infarction due to embolism of unspecified cerebral artery: Secondary | ICD-10-CM | POA: Insufficient documentation

## 2023-05-02 DIAGNOSIS — R0609 Other forms of dyspnea: Secondary | ICD-10-CM | POA: Diagnosis not present

## 2023-05-02 LAB — ECHOCARDIOGRAM COMPLETE
AR max vel: 1.8 cm2
AV Area VTI: 1.71 cm2
AV Area mean vel: 1.81 cm2
AV Mean grad: 5 mmHg
AV Peak grad: 8 mmHg
Ao pk vel: 1.41 m/s
Area-P 1/2: 3.72 cm2
S' Lateral: 2.6 cm

## 2023-05-02 NOTE — Progress Notes (Signed)
*  PRELIMINARY RESULTS* Echocardiogram 2D Echocardiogram has been performed.  Stacey Drain 05/02/2023, 4:23 PM

## 2023-05-03 DIAGNOSIS — E119 Type 2 diabetes mellitus without complications: Secondary | ICD-10-CM | POA: Diagnosis not present

## 2023-05-03 LAB — HM DIABETES EYE EXAM

## 2023-05-05 ENCOUNTER — Encounter: Payer: Self-pay | Admitting: Family Medicine

## 2023-06-14 ENCOUNTER — Ambulatory Visit (INDEPENDENT_AMBULATORY_CARE_PROVIDER_SITE_OTHER): Payer: Medicare Other | Admitting: Family Medicine

## 2023-06-14 VITALS — BP 128/80 | Ht 59.0 in | Wt 219.4 lb

## 2023-06-14 DIAGNOSIS — I517 Cardiomegaly: Secondary | ICD-10-CM

## 2023-06-14 DIAGNOSIS — R002 Palpitations: Secondary | ICD-10-CM

## 2023-06-14 DIAGNOSIS — R0609 Other forms of dyspnea: Secondary | ICD-10-CM

## 2023-06-14 DIAGNOSIS — Z79899 Other long term (current) drug therapy: Secondary | ICD-10-CM | POA: Diagnosis not present

## 2023-06-14 DIAGNOSIS — R000 Tachycardia, unspecified: Secondary | ICD-10-CM | POA: Diagnosis not present

## 2023-06-14 DIAGNOSIS — E785 Hyperlipidemia, unspecified: Secondary | ICD-10-CM | POA: Diagnosis not present

## 2023-06-14 DIAGNOSIS — I10 Essential (primary) hypertension: Secondary | ICD-10-CM | POA: Diagnosis not present

## 2023-06-14 DIAGNOSIS — N1831 Chronic kidney disease, stage 3a: Secondary | ICD-10-CM | POA: Diagnosis not present

## 2023-06-14 DIAGNOSIS — E1169 Type 2 diabetes mellitus with other specified complication: Secondary | ICD-10-CM | POA: Diagnosis not present

## 2023-06-14 DIAGNOSIS — E0822 Diabetes mellitus due to underlying condition with diabetic chronic kidney disease: Secondary | ICD-10-CM

## 2023-06-14 NOTE — Progress Notes (Signed)
 Subjective:    Patient ID: [name], female    DOB: 11-20-1950, 73 y.o.   MRN: [id number]  HPI She has a history of palpitations as well as mild DOE Echo did show good ejection fraction but also showed mild left ventricular hypertrophy She is trying to do the best she can with healthy diet She suffers with morbid obesity diabetes hypertension  Patient arrives for a follow up on diabetes. Patient reports no problems or concerns We reviewed over the echo results Also reviewed of her A1c from February Patient is trying to a good job with healthy eating try to fit in some walking She notices intermittent spells of low level tachycardia as well as palpitation symptoms Occasionally gets short of breath with activity No angina symptoms  Echo shows left ventricular hypertrophy  Discussed the use of AI scribe software for clinical note transcription with the patient, who gave verbal consent to proceed.  History of Present Illness   [name] is a 73 year old female with hypertension and diabetes who presents for a routine follow-up visit.  Her blood pressure has been a concern in the past, but her current medication regimen of irbesartan and amlodipine is effective. Her blood pressure reading today was 133/76 mmHg, which is an improvement from previous readings. An echocardiogram showed a good ejection fraction of 65-70%, with mild left ventricular hypertrophy.  She has a history of diabetes with a recent A1c of 7.1%. She is also on cholesterol medication. She sometimes experiences fatigue and a fast heartbeat, especially during activities like washing dishes or in the morning. Her Fitbit watch has shown heart rate readings as high as 111 to 123 bpm, causing concern about these fluctuations.  She maintains a routine of walking, primarily down the street from one corner to another, and tries to incorporate dietary measures into her lifestyle. Blood work done in February showed good  kidney function and a GFR within normal limits. Her B12 level was also adequate. No significant balance issues, and there is improvement in breathing and energy levels.      Review of Systems     Objective:   Physical Exam General-in no acute distress Eyes-no discharge Lungs-respiratory rate normal, CTA CV-no murmurs,RRR Extremities skin warm dry no edema Neuro grossly normal Behavior normal, alert        Assessment & Plan:  1. Palpitations (Primary) Not severe but intermittent.  Will be seeing cardiology in June.  Will benefit from telemetry.  2. Hyperlipidemia associated with type 2 diabetes mellitus (HCC) Goal is to keep LDL below 70.  Patient will be due for lab work before her next visit in August/September  3. Diabetes mellitus due to underlying condition with stage 3a chronic kidney disease, without long-term current use of insulin (HCC) Recent A1c reasonable.  Continue current medications.  Healthy diet.  Regular activity.  Check lab work before next visit  4. Essential hypertension, benign Check lab work before next visit, blood pressure under very good control.  Goal is to keep blood pressure under good control to minimize progression of LVH  5. DOE (dyspnea on exertion) Intermittent DOE with activity could be deconditioning but patient would benefit from cardiology consultation has appointment in June possibly will need CTA of coronary arteries  6. Tachycardia Please see discussion above  7. LVH (left ventricular hypertrophy) Please see discussion above, echo shows mild LVH blood pressure under good goal currently.  Patient encouraged to maintain healthy diet as well  Assessment  and Plan    Hypertension Blood pressure controlled at 128/80 mmHg with irbesartan and amlodipine. Current readings within desired range. Irbesartan aids ventricular hypertrophy, amlodipine reduces heart workload. - Continue irbesartan (Avapro). - Continue amlodipine. - Recheck blood  pressure during visits.  Left Ventricular Hypertrophy Mild hypertrophy noted. Ejection fraction 65-70%. Likely due to long-standing hypertension. Blood pressure control essential to prevent further thickening. - Continue current antihypertensive regimen. - Refer to cardiology for further evaluation and potential monitoring.  Type 2 Diabetes Mellitus A1c was 7.1% in February, slightly above target. Re-evaluation planned for later in the summer. - Re-evaluate A1c later in the summer.  Hyperlipidemia On medication to maintain LDL below 70 mg/dL. Continuing medication important for cholesterol management. - Continue cholesterol medication. - Re-evaluate lipid profile during next blood work.  General Health Maintenance Engages in physical activity and maintains a healthy diet. Eye exam completed in March. - Encourage regular physical activity. - Encourage healthy diet. - Schedule regular eye exams.

## 2023-07-13 ENCOUNTER — Other Ambulatory Visit: Payer: Self-pay | Admitting: Family Medicine

## 2023-08-02 ENCOUNTER — Ambulatory Visit (INDEPENDENT_AMBULATORY_CARE_PROVIDER_SITE_OTHER): Admitting: Internal Medicine

## 2023-08-02 ENCOUNTER — Other Ambulatory Visit (HOSPITAL_COMMUNITY)
Admission: RE | Admit: 2023-08-02 | Discharge: 2023-08-02 | Disposition: A | Discharge status: Home / Self Care | Source: Ambulatory Visit | Attending: Internal Medicine | Admitting: Internal Medicine

## 2023-08-02 ENCOUNTER — Encounter: Payer: Self-pay | Admitting: Internal Medicine

## 2023-08-02 VITALS — BP 130/68 | HR 92 | Ht 59.0 in | Wt 219.0 lb

## 2023-08-02 DIAGNOSIS — R0609 Other forms of dyspnea: Secondary | ICD-10-CM | POA: Insufficient documentation

## 2023-08-02 DIAGNOSIS — R002 Palpitations: Secondary | ICD-10-CM | POA: Insufficient documentation

## 2023-08-02 LAB — BRAIN NATRIURETIC PEPTIDE: B Natriuretic Peptide: 41 pg/mL (ref 0.0–100.0)

## 2023-08-02 NOTE — Progress Notes (Signed)
 Cardiology Office Note  Date: 08/02/2023   ID: [name], DOB [birth year], MRN [id number]  PCP:  Bennet Brasil, MD  Cardiologist:  Demarius Archila P Sayf Kerner, MD Electrophysiologist:  None   History of Present Illness: [name] is a 73 y.o. female  Referred to cardiology clinic for evaluation of DOE.  Ongoing DOE for the last few months, no orthopnea.  No leg swelling.  PND present.  No angina.  She reported having palpitations etc. DOE.  No isolated palpitations.  She reports having dizziness lasting for a few seconds but not minutes.  Does not bother her.  She gained weight recently.  Past Medical History:  Diagnosis Date   Arthritis    Diabetes mellitus without complication (HCC)    Hyperlipidemia    Hypertension    Mild sleep apnea    Prediabetes    Prolonged QT interval    Renal disorder     Past Surgical History:  Procedure Laterality Date   ABDOMINAL HYSTERECTOMY     CHOLECYSTECTOMY     COLON SURGERY     Colonoscopy   COLONOSCOPY     COLONOSCOPY N/A 07/11/2017   Procedure: COLONOSCOPY;  Surgeon: Suzette Espy, MD;  Location: [facility];  Service: Endoscopy;  Laterality: N/A;  8:30   EYE SURGERY     Cataracts   TUBAL LIGATION      Current Outpatient Medications  Medication Sig Dispense Refill   amLODipine  (NORVASC ) 2.5 MG tablet TAKE 1 TABLET BY MOUTH EVERY DAY 90 tablet 1   blood glucose meter kit and supplies Dispense based on patient and insurance preference. Use to check blood sugars once daily . (FOR ICD-10 E10.9, E11.9). 1 each 0   glucose blood (ACCU-CHEK GUIDE) test strip TEST BLOOD SUGAR ONCE DAILY (E11.9) 100 strip 5   irbesartan  (AVAPRO ) 150 MG tablet Take 1 tablet (150 mg total) by mouth daily. 90 tablet 1   metFORMIN  (GLUCOPHAGE -XR) 500 MG 24 hr tablet Take 1 tablet (500 mg total) by mouth daily with breakfast. 30 tablet 5   rosuvastatin  (CRESTOR ) 40 MG tablet TAKE 1 TABLET BY MOUTH EVERY DAY 90 tablet 1   No current  facility-administered medications for this visit.   Allergies:  Farxiga  [dapagliflozin ]   Social History: The patient  reports that she quit smoking about 31 years ago. Her smoking use included cigarettes. She has never used smokeless tobacco. She reports that she does not drink alcohol and does not use drugs.   Family History: The patient's family history includes Arthritis in her father and sister; Cancer in her father; Diabetes in her maternal grandmother and mother; Heart disease in her mother.   ROS:  Please see the history of present illness. Otherwise, complete review of systems is positive for none.  All other systems are reviewed and negative.   Physical Exam: VS:  BP 130/68 (BP Location: Left Arm, Patient Position: Sitting, Cuff Size: Large)   Pulse 92   Ht 4\' 11"  (1.499 m)   Wt 219 lb (99.3 kg)   BMI 44.23 kg/m , BMI Body mass index is 44.23 kg/m.  Wt Readings from Last 3 Encounters:  08/02/23 219 lb (99.3 kg)  06/14/23 219 lb 6.4 oz (99.5 kg)  04/25/23 220 lb (99.8 kg)    General: Patient appears comfortable at rest. HEENT: Conjunctiva and lids normal, oropharynx clear with moist mucosa. Neck: Supple, no elevated JVP or carotid bruits, no thyromegaly. Lungs: Clear to auscultation, nonlabored breathing at rest. Cardiac:  Regular rate and rhythm, no S3 or significant systolic murmur, no pericardial rub. Abdomen: Soft, nontender, no hepatomegaly, bowel sounds present, no guarding or rebound. Extremities: No pitting edema, distal pulses 2+. Skin: Warm and dry. Musculoskeletal: No kyphosis. Neuropsychiatric: Alert and oriented x3, affect grossly appropriate.  Recent Labwork: 01/09/2023: ALT 16; AST 21; TSH 1.400 04/17/2023: BUN 19; Creatinine, Ser 0.93; Potassium 4.2; Sodium 143     Component Value Date/Time   CHOL 135 01/09/2023 1216   TRIG 69 01/09/2023 1216   HDL 53 01/09/2023 1216   CHOLHDL 2.5 01/09/2023 1216   CHOLHDL 5.6 05/28/2014 1018   VLDL 16 05/28/2014  1018   LDLCALC 68 01/09/2023 1216     Assessment and Plan:  DOE: Ongoing DOE x few months with PND.  No orthopnea or leg swelling.  Cardiac risk factors include DM 2 and HTN.  CT cardiac is not covered, will obtain exercise Myoview and PFTs.  Echocardiogram reviewed from March 2025 that showed normal LVEF, indeterminate diastology but normal LVEDP and CVP was 3 mmHg.  Obtain BNP.  Palpitations: Patient reports having palpitations associated with DOE.  Does not have isolated palpitations.  Will defer event monitor for now.  HTN, controlled: Continue amlodipine  2.5 mg once daily and irbesartan  150 mg once daily, follows with PCP.  HLD, at goal: Continue rosuvastatin  40 mg at bedtime, follows with PCP.    Medication Adjustments/Labs and Tests Ordered: Current medicines are reviewed at length with the patient today.  Concerns regarding medicines are outlined above.    Disposition:  Follow up 3 months  Signed, Avo Schlachter Beauford Bounds, MD, 08/02/2023 2:49 PM    [facility] at [facility] [address] [place], [place] [address]

## 2023-08-02 NOTE — Patient Instructions (Signed)
 Medication Instructions:  Your physician recommends that you continue on your current medications as directed. Please refer to the Current Medication list given to you today.  *If you need a refill on your cardiac medications before your next appointment, please call your pharmacy*  Lab Work: BNP  If you have labs (blood work) drawn today and your tests are completely normal, you will receive your results only by: MyChart Message (if you have MyChart) OR A paper copy in the mail If you have any lab test that is abnormal or we need to change your treatment, we will call you to review the results.  Testing/Procedures: Your physician has requested that you have a lexiscan myoview. For further information please visit [email or web address] Please follow instruction sheet, as given.  Your physician has recommended that you have a pulmonary function test. Pulmonary Function Tests are a group of tests that measure how well air moves in and out of your lungs.   Follow-Up: At [facility], you and your health needs are our priority.  As part of our continuing mission to provide you with exceptional heart care, our providers are all part of one team.  This team includes your primary Cardiologist (physician) and Advanced Practice Providers or APPs (Physician Assistants and Nurse Practitioners) who all work together to provide you with the care you need, when you need it.  Your next appointment:   3 month(s)  Provider:   You may see Vishnu P Mallipeddi, MD or one of the following Advanced Practice Providers on your designated Care Team:   [place], PA-C  Scotesia Selma, [place] Theotis Flake, [place]     We recommend signing up for the patient portal called "MyChart".  Sign up information is provided on this After Visit Summary.  MyChart is used to connect with patients for Virtual Visits (Telemedicine).  Patients are able to view lab/test results, encounter notes, upcoming  appointments, etc.  Non-urgent messages can be sent to your provider as well.   To learn more about what you can do with MyChart, go to [email or web address].   Other Instructions

## 2023-08-03 ENCOUNTER — Ambulatory Visit: Payer: Self-pay | Admitting: Internal Medicine

## 2023-08-06 NOTE — Telephone Encounter (Signed)
  Pt is returning call, she verified her number [phone]

## 2023-08-08 ENCOUNTER — Telehealth (HOSPITAL_COMMUNITY): Payer: Self-pay

## 2023-08-09 ENCOUNTER — Ambulatory Visit (HOSPITAL_COMMUNITY)
Admission: RE | Admit: 2023-08-09 | Discharge: 2023-08-09 | Disposition: A | Discharge status: Home / Self Care | Source: Ambulatory Visit | Attending: Internal Medicine | Admitting: Internal Medicine

## 2023-08-09 ENCOUNTER — Encounter (HOSPITAL_COMMUNITY): Payer: Self-pay

## 2023-08-09 ENCOUNTER — Encounter (HOSPITAL_BASED_OUTPATIENT_CLINIC_OR_DEPARTMENT_OTHER)
Admission: RE | Admit: 2023-08-09 | Discharge: 2023-08-09 | Disposition: A | Discharge status: Home / Self Care | Source: Ambulatory Visit | Attending: Internal Medicine | Admitting: Internal Medicine

## 2023-08-09 DIAGNOSIS — R0609 Other forms of dyspnea: Secondary | ICD-10-CM

## 2023-08-09 LAB — NM MYOCAR MULTI W/SPECT W/WALL MOTION / EF
Angina Index: 0
Duke Treadmill Score: 2
Estimated workload: 4.6
Exercise duration (min): 2 min
Exercise duration (sec): 25 s
LV dias vol: 71 mL (ref 46–106)
LV sys vol: 31 mL (ref 3.8–5.2)
MPHR: 147 {beats}/min
Nuc Stress EF: 56 %
Peak HR: 127 {beats}/min
Percent HR: 86 %
RATE: 0.7
RPE: 15
Rest HR: 68 {beats}/min
Rest Nuclear Isotope Dose: 11 mCi
SDS: 1
SRS: 0
SSS: 1
ST Depression (mm): 0 mm
Stress Nuclear Isotope Dose: 31 mCi
TID: 1.05

## 2023-08-09 LAB — PULMONARY FUNCTION TEST
DL/VA % pred: 135 %
DL/VA: 5.86 ml/min/mmHg/L
DLCO unc % pred: 86 %
DLCO unc: 13.76 ml/min/mmHg
FEF 25-75 Post: 1 L/s
FEF 25-75 Pre: 2.6 L/s
FEF2575-%Change-Post: -61 %
FEF2575-%Pred-Post: 67 %
FEF2575-%Pred-Pre: 175 %
FEV1-%Change-Post: -13 %
FEV1-%Pred-Post: 73 %
FEV1-%Pred-Pre: 84 %
FEV1-Post: 1.25 L
FEV1-Pre: 1.45 L
FEV1FVC-%Change-Post: -11 %
FEV1FVC-%Pred-Pre: 112 %
FEV6-%Change-Post: 4 %
FEV6-%Pred-Post: 75 %
FEV6-%Pred-Pre: 72 %
FEV6-Post: 1.66 L
FEV6-Pre: 1.59 L
FEV6FVC-%Pred-Post: 105 %
FEV6FVC-%Pred-Pre: 105 %
FVC-%Change-Post: -2 %
FVC-%Pred-Post: 72 %
FVC-%Pred-Pre: 74 %
FVC-Post: 1.67 L
FVC-Pre: 1.71 L
Post FEV1/FVC ratio: 75 %
Post FEV6/FVC ratio: 100 %
Pre FEV1/FVC ratio: 85 %
Pre FEV6/FVC Ratio: 100 %
RV % pred: 68 %
RV: 1.37 L
TLC % pred: 70 %
TLC: 3.03 L

## 2023-08-09 MED ORDER — TECHNETIUM TC 99M TETROFOSMIN IV KIT
10.0000 | PACK | Freq: Once | INTRAVENOUS | Status: AC | PRN
  Administered 2023-08-09: 11 via INTRAVENOUS

## 2023-08-09 MED ORDER — ALBUTEROL SULFATE (2.5 MG/3ML) 0.083% IN NEBU
2.5000 mg | INHALATION_SOLUTION | Freq: Once | RESPIRATORY_TRACT | Status: AC
  Administered 2023-08-09: 2.5 mg via RESPIRATORY_TRACT

## 2023-08-09 MED ORDER — TECHNETIUM TC 99M TETROFOSMIN IV KIT
30.0000 | PACK | Freq: Once | INTRAVENOUS | Status: AC | PRN
  Administered 2023-08-09: 31 via INTRAVENOUS

## 2023-08-09 MED ORDER — REGADENOSON 0.4 MG/5ML IV SOLN
INTRAVENOUS | Status: AC
  Filled 2023-08-09: qty 5

## 2023-08-09 MED ORDER — SODIUM CHLORIDE FLUSH 0.9 % IV SOLN
INTRAVENOUS | Status: AC
  Administered 2023-08-09: 10 mL via INTRAVENOUS
  Filled 2023-08-09: qty 10

## 2023-08-16 ENCOUNTER — Other Ambulatory Visit: Payer: Self-pay | Admitting: Family Medicine

## 2023-08-16 DIAGNOSIS — I10 Essential (primary) hypertension: Secondary | ICD-10-CM

## 2023-11-14 ENCOUNTER — Ambulatory Visit: Admitting: Family Medicine

## 2023-11-14 DIAGNOSIS — E1169 Type 2 diabetes mellitus with other specified complication: Secondary | ICD-10-CM | POA: Diagnosis not present

## 2023-11-14 DIAGNOSIS — E0822 Diabetes mellitus due to underlying condition with diabetic chronic kidney disease: Secondary | ICD-10-CM | POA: Diagnosis not present

## 2023-11-14 DIAGNOSIS — E785 Hyperlipidemia, unspecified: Secondary | ICD-10-CM | POA: Diagnosis not present

## 2023-11-14 DIAGNOSIS — Z79899 Other long term (current) drug therapy: Secondary | ICD-10-CM | POA: Diagnosis not present

## 2023-11-14 DIAGNOSIS — N1831 Chronic kidney disease, stage 3a: Secondary | ICD-10-CM | POA: Diagnosis not present

## 2023-11-15 ENCOUNTER — Ambulatory Visit: Attending: Student | Admitting: Student

## 2023-11-15 ENCOUNTER — Ambulatory Visit: Payer: Self-pay | Admitting: Family Medicine

## 2023-11-15 ENCOUNTER — Encounter: Payer: Self-pay | Admitting: Student

## 2023-11-15 VITALS — BP 124/66 | HR 55 | Ht 59.0 in | Wt 218.0 lb

## 2023-11-15 DIAGNOSIS — I10 Essential (primary) hypertension: Secondary | ICD-10-CM | POA: Diagnosis not present

## 2023-11-15 DIAGNOSIS — R002 Palpitations: Secondary | ICD-10-CM | POA: Diagnosis not present

## 2023-11-15 DIAGNOSIS — E785 Hyperlipidemia, unspecified: Secondary | ICD-10-CM | POA: Diagnosis not present

## 2023-11-15 DIAGNOSIS — R0609 Other forms of dyspnea: Secondary | ICD-10-CM | POA: Insufficient documentation

## 2023-11-15 LAB — BASIC METABOLIC PANEL WITH GFR
BUN/Creatinine Ratio: 15 (ref 12–28)
BUN: 17 mg/dL (ref 8–27)
CO2: 20 mmol/L (ref 20–29)
Calcium: 9 mg/dL (ref 8.7–10.3)
Chloride: 107 mmol/L — ABNORMAL HIGH (ref 96–106)
Creatinine, Ser: 1.13 mg/dL — ABNORMAL HIGH (ref 0.57–1.00)
Glucose: 114 mg/dL — ABNORMAL HIGH (ref 70–99)
Potassium: 4.2 mmol/L (ref 3.5–5.2)
Sodium: 140 mmol/L (ref 134–144)
eGFR: 51 mL/min/1.73 — ABNORMAL LOW (ref >59)

## 2023-11-15 LAB — LIPID PANEL
Chol/HDL Ratio: 2.7 ratio (ref 0.0–4.4)
Cholesterol, Total: 126 mg/dL (ref 100–199)
HDL: 47 mg/dL (ref >39)
LDL Chol Calc (NIH): 65 mg/dL (ref 0–99)
Triglycerides: 68 mg/dL (ref 0–149)
VLDL Cholesterol Cal: 14 mg/dL (ref 5–40)

## 2023-11-15 LAB — MICROALBUMIN / CREATININE URINE RATIO
Creatinine, Urine: 109.4 mg/dL
Microalb/Creat Ratio: 197 mg/g{creat} — ABNORMAL HIGH (ref 0–29)
Microalbumin, Urine: 215 ug/mL

## 2023-11-15 LAB — HEPATIC FUNCTION PANEL
ALT: 20 IU/L (ref 0–32)
AST: 20 IU/L (ref 0–40)
Albumin: 4.3 g/dL (ref 3.8–4.8)
Alkaline Phosphatase: 125 IU/L (ref 49–135)
Bilirubin Total: 0.5 mg/dL (ref 0.0–1.2)
Bilirubin, Direct: 0.17 mg/dL (ref 0.00–0.40)
Total Protein: 7 g/dL (ref 6.0–8.5)

## 2023-11-15 LAB — HEMOGLOBIN A1C
Est. average glucose Bld gHb Est-mCnc: 166 mg/dL
Hgb A1c MFr Bld: 7.4 % — ABNORMAL HIGH (ref 4.8–5.6)

## 2023-11-15 NOTE — Patient Instructions (Signed)
 Medication Instructions:  Your physician recommends that you continue on your current medications as directed. Please refer to the Current Medication list given to you today.  *If you need a refill on your cardiac medications before your next appointment, please call your pharmacy*  Lab Work: NONE   If you have labs (blood work) drawn today and your tests are completely normal, you will receive your results only by: MyChart Message (if you have MyChart) OR A paper copy in the mail If you have any lab test that is abnormal or we need to change your treatment, we will call you to review the results.  Testing/Procedures: NONE   Follow-Up: At [facility], you and your health needs are our priority.  As part of our continuing mission to provide you with exceptional heart care, our providers are all part of one team.  This team includes your primary Cardiologist (physician) and Advanced Practice Providers or APPs (Physician Assistants and Nurse Practitioners) who all work together to provide you with the care you need, when you need it.  Your next appointment:   1 year(s)  Provider:   Vishnu Mallipeddi, MD or Laymon Qua, PA-C    We recommend signing up for the patient portal called MyChart.  Sign up information is provided on this After Visit Summary.  MyChart is used to connect with patients for Virtual Visits (Telemedicine).  Patients are able to view lab/test results, encounter notes, upcoming appointments, etc.  Non-urgent messages can be sent to your provider as well.   To learn more about what you can do with MyChart, go to [email or web address].   Other Instructions Thank you for choosing [facility]!

## 2023-11-15 NOTE — Progress Notes (Addendum)
 Cardiology Office Note    Date:  11/15/2023  ID:  ARSHI DUARTE, DOB 1950-10-15, MRN [id number] Cardiologist: Vishnu P Mallipeddi, MD Cardiology APP:  Johnson Laymon HERO, PA-C { :  History of Present Illness:    [name] is a 73 y.o. female with past medical history of palpitations, HTN, HLD and Type II DM who presents to the office today for 82-month follow-up.  She was examined by Dr. Mallipeddi in 07/2023 as a new patient referral for dyspnea on exertion. Reported symptoms had been occurring for several months and she denied any orthopnea or lower extremity edema. Recent echocardiogram had shown a preserved EF of 65 to 70% with mild LVH and no significant valve abnormalities. An Exercise Myoview  and PFT's were recommended for further assessment. PFT's showed minimal obstruction and moderate restrictive lung disease which was felt to be secondary to body habitus. Exercise Myoview  showed no evidence of ischemia and was a low-risk study.  In talking with the patient today, her biggest issue over the past few months has been worsening pain along her legs bilaterally. This can occur with activity or while sitting. Occurs along her hip joints bilaterally and radiates into her thighs and into her knees. She takes Aleve with improvement in symptoms. Tries to use this sparingly but has to take at night to help with sleep at times. Breathing has overall been stable and no recent orthopnea, PND or pitting edema. No recent chest pain and she reports her palpitations have resolved. She feels like these were possibly due to the warmer temperatures at the time of occurrence. She only consumes an occasional soda and mostly drinks water . No alcohol use.  Studies Reviewed:   EKG: EKG is not ordered today.  Echocardiogram: 04/2023 IMPRESSIONS     1. Left ventricular ejection fraction, by estimation, is 65 to 70%. The  left ventricle has normal function. The left ventricle has no regional  wall  motion abnormalities. There is mild left ventricular hypertrophy.  Left ventricular diastolic parameters  are indeterminate.   2. Right ventricular systolic function is normal. The right ventricular  size is normal.   3. The mitral valve is normal in structure. Trivial mitral valve  regurgitation.   4. The aortic valve is tricuspid. Aortic valve regurgitation is not  visualized.   5. The inferior vena cava is normal in size with greater than 50%  respiratory variability, suggesting right atrial pressure of 3 mmHg.   NST: 07/2023   The study is normal. There are no perfusion defects.  The study is low risk.   No ST deviation was noted.   LV perfusion is normal.   Left ventricular function is normal. Nuclear stress EF: 56%. The left ventricular ejection fraction is normal (55-65%). End diastolic cavity size is normal.   Exercise capacity 90% fo predicted based on age and gender.   Physical Exam:   VS:  BP 124/66 (BP Location: Left Arm, Cuff Size: Large)   Pulse (!) 55   Ht 4' 11 (1.499 m)   Wt 218 lb (98.9 kg)   SpO2 100%   BMI 44.03 kg/m    Wt Readings from Last 3 Encounters:  11/15/23 218 lb (98.9 kg)  08/02/23 219 lb (99.3 kg)  06/14/23 219 lb 6.4 oz (99.5 kg)     GEN: Well nourished, well developed female appearing in no acute distress NECK: No JVD; No carotid bruits CARDIAC: RRR, no murmurs, rubs, gallops RESPIRATORY:  Clear to auscultation without rales,  wheezing or rhonchi  ABDOMEN: Appears non-distended. No obvious abdominal masses. EXTREMITIES: No clubbing or cyanosis. No pitting edema.  Distal pedal pulses are 2+ bilaterally.   Assessment and Plan:   1. DOE (dyspnea on exertion) - Cardiac workup thus far has overall been reassuring as her echocardiogram showed a preserved EF of 65 to 70% and NST showed no evidence of ischemia and was a low-risk study. PFT's showed minimal obstruction and moderate restrictive lung disease which was felt to be secondary to body  habitus.   - She reports her breathing has overall been stable but if she develops worsening symptoms, would recommend Pulmonology referral. Continue with risk factor modification.    2. Palpitations - She denies any recent symptoms. No indication for a monitor at this time. Encouraged her to make us  aware if symptoms increase in frequency or severity.  3. Essential hypertension - BP is well-controlled at 124/66 during today's visit. Continue current medical therapy with Amlodipine  2.5 mg daily and Irbesartan  150 mg daily.  4. Hyperlipidemia LDL goal <70 - FLP earlier this week showed total cholesterol 126 and LDL 65. LFT's WNL. Continue current medical therapy with Crestor  40 mg daily. We reviewed possibly reducing this to 20 mg daily to see if this helps with her muscle aches but she wishes to discuss with her PCP at her visit next week. Encouraged her to limit use of Aleve as creatinine was elevated at 1.13 by recent labs.  Disposition: Will plan for follow-up in 1 year unless clinically indicated in the interim.   Signed, Laymon CHRISTELLA Qua, PA-C

## 2023-11-19 ENCOUNTER — Encounter: Payer: Self-pay | Admitting: Family Medicine

## 2023-11-19 ENCOUNTER — Ambulatory Visit: Admitting: Family Medicine

## 2023-11-19 VITALS — BP 131/75 | HR 83 | Temp 98.2°F | Ht 59.0 in | Wt 216.0 lb

## 2023-11-19 DIAGNOSIS — Z8673 Personal history of transient ischemic attack (TIA), and cerebral infarction without residual deficits: Secondary | ICD-10-CM | POA: Diagnosis not present

## 2023-11-19 DIAGNOSIS — Z23 Encounter for immunization: Secondary | ICD-10-CM

## 2023-11-19 DIAGNOSIS — N1831 Chronic kidney disease, stage 3a: Secondary | ICD-10-CM | POA: Diagnosis not present

## 2023-11-19 DIAGNOSIS — I10 Essential (primary) hypertension: Secondary | ICD-10-CM

## 2023-11-19 DIAGNOSIS — E785 Hyperlipidemia, unspecified: Secondary | ICD-10-CM | POA: Diagnosis not present

## 2023-11-19 DIAGNOSIS — E1169 Type 2 diabetes mellitus with other specified complication: Secondary | ICD-10-CM | POA: Diagnosis not present

## 2023-11-19 DIAGNOSIS — R7989 Other specified abnormal findings of blood chemistry: Secondary | ICD-10-CM | POA: Diagnosis not present

## 2023-11-19 NOTE — Progress Notes (Signed)
   Subjective:    Patient ID: [name], female    DOB: 20-Aug-1950, 73 y.o.   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 Immunization due (Primary) Flu vaccine today - Flu vaccine HIGH DOSE PF(Fluzone Trivalent)  2. Diabetes mellitus due to underlying condition with stage 3a chronic kidney disease, without long-term current use of insulin (HCC) A1c not so good control.  Not on any medicines currently.  We did discuss glipizide versus GLP-1 Patient is concerned that her insurance does not do a good job coverage of GLP-1's We will look into this  3. Hyperlipidemia associated with type 2 diabetes mellitus (HCC) Keep LDL below 70 and if possible below 55 continue statin  4. Morbid obesity (HCC) Portion control regular physical activity  5. History of stroke GLP-1's would benefit her  6. Essential hypertension, benign Blood pressure decent control continue current medication  7. Elevated serum creatinine Will repeat lab work again when she is well-hydrated in 4 weeks

## 2023-11-20 ENCOUNTER — Telehealth: Payer: Self-pay | Admitting: Pharmacist

## 2023-11-20 NOTE — Addendum Note (Signed)
 Addended by: Ridwan Bondy M on: 11/20/2023 08:04 AM   Modules accepted: Orders

## 2023-11-20 NOTE — Telephone Encounter (Signed)
 Discussed T2DM options with PCP Patient limited financially due to high copays on brand name medications Recommend Novo Nordisk PAP--GLP1 options Ozempic vs. Rybelsus  Encouraged PCP to place referral to pharmacy for medication access Intolerant to Farxiga /Metformin   Mliss Tarry Griffin, PharmD, BCACP, CPP Clinical Pharmacist, [facility]

## 2023-11-20 NOTE — Progress Notes (Signed)
 Order placed

## 2023-11-26 ENCOUNTER — Encounter: Payer: Self-pay | Admitting: Family Medicine

## 2023-11-27 ENCOUNTER — Telehealth: Payer: Self-pay | Admitting: Pharmacist

## 2023-11-27 ENCOUNTER — Other Ambulatory Visit (HOSPITAL_COMMUNITY): Payer: Self-pay | Admitting: Family Medicine

## 2023-11-27 DIAGNOSIS — Z1231 Encounter for screening mammogram for malignant neoplasm of breast: Secondary | ICD-10-CM

## 2023-11-27 DIAGNOSIS — E0822 Diabetes mellitus due to underlying condition with diabetic chronic kidney disease: Secondary | ICD-10-CM

## 2023-11-27 NOTE — Telephone Encounter (Signed)
 Nurses Please go ahead and consult clinical pharmacy-I spoke with Mliss regarding this patient.  She stated that there may be a grant to help the patient get started on a GLP-1-Julie had made mention that if family income was below $68,000 that she was fairly certain she could get a grant to help get the patient on GLP-1's-otherwise it would be over $600 a month which the patient cannot afford  Clinical pharmacy will reach out to the patient directly.  Then clinical pharmacy will reach out to myself regarding the orders to initiate the GLP-1  Thanks-Dr. Glendia  Please let Woodie know that we have started this process thank you

## 2023-11-27 NOTE — Telephone Encounter (Signed)
 Appt needed for potential GLP1 start/medication management.  Patient's copay would be close to $600 for monthly GLP1.  Will discuss options and have patient scheduled with pharmacy.  Lenni Reckner Dattero Virgene Tirone, PharmD, BCACP, CPP Clinical Pharmacist, [facility]

## 2023-11-30 ENCOUNTER — Telehealth: Payer: Self-pay

## 2023-11-30 NOTE — Progress Notes (Signed)
 Care Guide Pharmacy Note  11/30/2023 Name: RAYLENE CARMICKLE MRN: [id number] DOB: 1950-07-08  Referred By: Alphonsa Glendia LABOR, MD Reason for referral: Complex Care Management (Outreach to schedule with Pharm d )   JEANEE FABRE is a 73 y.o. year old female who is a primary care patient of Luking, Glendia LABOR, MD.  Lenward FORBES Blood was referred to the pharmacist for assistance related to: DMII  Successful contact was made with the patient to discuss pharmacy services including being ready for the pharmacist to call at least 5 minutes before the scheduled appointment time and to have medication bottles and any blood pressure readings ready for review. The patient agreed to meet with the pharmacist via telephone visit on (date/time).12/10/2023  Jeoffrey [place] , RMA     [place]  [facility], [facility] Guide  Direct Dial: [phone]  Website: [email or web address]

## 2023-12-04 ENCOUNTER — Telehealth: Payer: Self-pay | Admitting: *Deleted

## 2023-12-04 NOTE — Telephone Encounter (Signed)
 Received cpap supply order from [place]. Three month refill sent. Pt is due next month for yearly follow-up. Please call patient and schedule her with [place] NP or Dr Buck for follow-up.

## 2023-12-06 ENCOUNTER — Ambulatory Visit (HOSPITAL_COMMUNITY)
Admission: RE | Admit: 2023-12-06 | Discharge: 2023-12-06 | Disposition: A | Discharge status: Home / Self Care | Source: Ambulatory Visit | Attending: Family Medicine | Admitting: Family Medicine

## 2023-12-06 DIAGNOSIS — Z1231 Encounter for screening mammogram for malignant neoplasm of breast: Secondary | ICD-10-CM | POA: Diagnosis not present

## 2023-12-06 NOTE — Telephone Encounter (Signed)
 Thank you :)

## 2023-12-09 ENCOUNTER — Encounter: Payer: Self-pay | Admitting: Pharmacist

## 2023-12-10 ENCOUNTER — Other Ambulatory Visit

## 2023-12-16 ENCOUNTER — Other Ambulatory Visit: Payer: Self-pay | Admitting: Family Medicine

## 2023-12-17 ENCOUNTER — Other Ambulatory Visit

## 2023-12-17 DIAGNOSIS — E1165 Type 2 diabetes mellitus with hyperglycemia: Secondary | ICD-10-CM

## 2023-12-17 NOTE — Progress Notes (Signed)
 12/17/2023 Name: [name] MRN: [id number] DOB: [birth year]  Chief Complaint  Patient presents with   Diabetes    [name] is a 73 y.o. year old female who presented for a telephone visit.   They were referred to the pharmacist by their PCP for assistance in managing diabetes and medication access.    Subjective:  Patient having affordability issues with potential brand name medications for T2DM  Care Team: Primary Care Provider: Alphonsa Glendia LABOR, MD   Medication Access/Adherence  Current Pharmacy:  [phone] [place], [place] - [address] AT [facility] [address] [place] [address] Phone: [phone] Fax: [phone]   Patient reports affordability concerns with their medications: Yes  Patient reports access/transportation concerns to their pharmacy: No  Patient reports adherence concerns with their medications:  No     Diabetes:  Current medications: n/a Medications tried in the past: farxiga  (frequent urination/costly), wants GLP1 but too expensive  Current glucose readings: FBG<150  Patient denies hypoglycemic s/sx including dizziness, shakiness, sweating. Patient denies hyperglycemic symptoms including polyuria, polydipsia, polyphagia, nocturia, neuropathy, blurred vision.  Current meal patterns:  - Breakfast: doesn't really eat breakfast  - Lunch sandwiches - Drinks mostly water , occasional soda  Current physical activity: encouraged as able  Current medication access support: n/a  Macrovascular and Microvascular Risk Reduction:  Statin? yes (rosuvastatin ); ACEi/ARB? yes (irbesartan  ) Last urinary albumin/creatinine ratio:  Lab Results  Component Value Date   MICRALBCREAT 197 (H) 11/14/2023   MICRALBCREAT 21 08/04/2022   MICRALBCREAT 126 (H) 02/02/2022   MICRALBCREAT 105 (H) 11/25/2020   MICRALBCREAT 166 (H) 11/04/2019   MICRALBCREAT 201 (H) 04/10/2019   MICRALBCREAT 10 05/06/2018   MICRALBCREAT 10.6  05/04/2017   Last eye exam:  Lab Results  Component Value Date   HMDIABEYEEXA No Retinopathy 05/03/2023   Last foot exam: 08/09/2022 Tobacco Use:  Tobacco Use: Medium Risk (11/19/2023)   Patient History    Smoking Tobacco Use: Former    Smokeless Tobacco Use: Never    Passive Exposure: Not on file     Objective:  Lab Results  Component Value Date   HGBA1C 7.4 (H) 11/14/2023    Lab Results  Component Value Date   CREATININE 1.13 (H) 11/14/2023   BUN 17 11/14/2023   NA 140 11/14/2023   K 4.2 11/14/2023   CL 107 (H) 11/14/2023   CO2 20 11/14/2023    Lab Results  Component Value Date   CHOL 126 11/14/2023   HDL 47 11/14/2023   LDLCALC 65 11/14/2023   TRIG 68 11/14/2023   CHOLHDL 2.7 11/14/2023    Medications Reviewed Today     Reviewed by Billee Mliss BIRCH, [facility] (Pharmacist) on 12/31/23 at 1538  Med List Status: <None>   Medication Order Taking? Sig Documenting Provider Last Dose Status Informant  amLODipine  (NORVASC ) 2.5 MG tablet [id number]  TAKE 1 TABLET BY MOUTH EVERY DAY Luking, Scott A, MD  Active   aspirin EC 81 MG tablet [id number]  Take 81 mg by mouth daily. Swallow whole. [provider]  Active   blood glucose meter kit and supplies [id number]  Dispense based on patient and insurance preference. Use to check blood sugars once daily . (FOR ICD-10 E10.9, E11.9). Alphonsa Glendia LABOR, MD  Active   glucose blood (ACCU-CHEK GUIDE) test strip [id number]  TEST BLOOD SUGAR ONCE DAILY (E11.9) Alphonsa Glendia LABOR, MD  Active   irbesartan  (AVAPRO ) 150 MG tablet [id number]  TAKE 1 TABLET BY MOUTH  EVERY DAY Alphonsa Glendia LABOR, MD  Active   rosuvastatin  (CRESTOR ) 40 MG tablet [id number]  TAKE 1 TABLET BY MOUTH EVERY DAY Luking, Scott A, MD  Active              Assessment/Plan:   Diabetes: - Currently uncontrolled; goal A1c <7%. Cardiorenal risk reduction is opportunities for improvement.. Blood pressure is at goal <130/80. LDL is at goal.  - Reviewed long term  cardiovascular and renal outcomes of uncontrolled blood sugar. and Reviewed goal A1c, goal fasting, and goal 2 hour post prandial glucose. Recommended to check glucose fasting or if symptomatic - Would prefer to start GLP1 however program is limited for next year and Medicare patients.  Continue to monitor sugar and check for open programs in 2026 (potentially Januvia)   Follow Up Plan: 1 month  Mliss Tarry Griffin, PharmD, BCACP, CPP Clinical Pharmacist, [facility]

## 2023-12-29 ENCOUNTER — Other Ambulatory Visit: Payer: Self-pay | Admitting: Family Medicine

## 2024-01-02 DIAGNOSIS — M25561 Pain in right knee: Secondary | ICD-10-CM | POA: Diagnosis not present

## 2024-01-02 DIAGNOSIS — M1711 Unilateral primary osteoarthritis, right knee: Secondary | ICD-10-CM | POA: Diagnosis not present

## 2024-01-14 ENCOUNTER — Other Ambulatory Visit

## 2024-01-14 VITALS — Ht 59.0 in | Wt 216.0 lb

## 2024-01-14 DIAGNOSIS — E119 Type 2 diabetes mellitus without complications: Secondary | ICD-10-CM

## 2024-01-14 NOTE — Progress Notes (Signed)
 12/17/2023 Name: [name] MRN: [id number] DOB: 01/23/1951  Chief Complaint  Patient presents with   Diabetes    [name] is a 73 y.o. year old female who presented for a telephone visit.   They were referred to the pharmacist by their PCP for assistance in managing diabetes and medication access.    Subjective:  Patient having affordability issues with potential brand name medications for T2DM.  She states Ozempic would be >$400, then $235/month.  There is no manufacturer assistance for Ozempic at this time.  We will discuss options moving forward.  Patient needs to lose 20-25 lbs in order to schedule knee surgery.  She just received a steroid injection to help ease the pain.  Care Team: Primary Care Provider: Alphonsa Glendia LABOR, MD   Medication Access/Adherence  [phone] [place], [place] - [address] AT [facility] [address] [place] [address] Phone: [phone] Fax: [phone]   Patient reports affordability concerns with their medications: Yes  Patient reports access/transportation concerns to their pharmacy: No  Patient reports adherence concerns with their medications:  No     Diabetes:  Current medications: n/a Medications tried in the past: farxiga  (frequent urination/costly), wants GLP1 but too expensive  Current glucose readings: FBG<150  Patient denies hypoglycemic s/sx including dizziness, shakiness, sweating. Patient denies hyperglycemic symptoms including polyuria, polydipsia, polyphagia, nocturia, neuropathy, blurred vision.  Current meal patterns:  - Breakfast: doesn't really eat breakfast  - Lunch sandwiches - Drinks mostly water , occasional soda  Current physical activity: encouraged as able  Current medication access support: n/a  Macrovascular and Microvascular Risk Reduction:  Statin? yes (rosuvastatin ); ACEi/ARB? yes (irbesartan  ) Last urinary albumin/creatinine ratio:  Lab  Results  Component Value Date   MICRALBCREAT 197 (H) 11/14/2023   MICRALBCREAT 21 08/04/2022   MICRALBCREAT 126 (H) 02/02/2022   MICRALBCREAT 105 (H) 11/25/2020   MICRALBCREAT 166 (H) 11/04/2019   MICRALBCREAT 201 (H) 04/10/2019   MICRALBCREAT 10 05/06/2018   MICRALBCREAT 10.6 05/04/2017   Last eye exam:  Lab Results  Component Value Date   HMDIABEYEEXA No Retinopathy 05/03/2023   Last foot exam: 08/09/2022 Tobacco Use:  Tobacco Use: Medium Risk (11/19/2023)   Patient History    Smoking Tobacco Use: Former    Smokeless Tobacco Use: Never    Passive Exposure: Not on file     Objective:  Lab Results  Component Value Date   HGBA1C 7.4 (H) 11/14/2023    Lab Results  Component Value Date   CREATININE 1.13 (H) 11/14/2023   BUN 17 11/14/2023   NA 140 11/14/2023   K 4.2 11/14/2023   CL 107 (H) 11/14/2023   CO2 20 11/14/2023    Lab Results  Component Value Date   CHOL 126 11/14/2023   HDL 47 11/14/2023   LDLCALC 65 11/14/2023   TRIG 68 11/14/2023   CHOLHDL 2.7 11/14/2023    Medications Reviewed Today     Reviewed by Billee Mliss BIRCH, [facility] (Pharmacist) on 01/14/24 at 1147  Med List Status: <None>   Medication Order Taking? Sig Documenting Provider Last Dose Status Informant  amLODipine  (NORVASC ) 2.5 MG tablet [id number]  TAKE 1 TABLET BY MOUTH EVERY DAY Luking, Scott A, MD  Active   aspirin EC 81 MG tablet [id number]  Take 81 mg by mouth daily. Swallow whole. [provider]  Active   blood glucose meter kit and supplies [id number]  Dispense based on patient and insurance preference. Use to  check blood sugars once daily . (FOR ICD-10 E10.9, E11.9). Alphonsa Glendia LABOR, MD  Active   glucose blood (ACCU-CHEK GUIDE) test strip [id number]  TEST BLOOD SUGAR ONCE DAILY (E11.9) Alphonsa Glendia LABOR, MD  Active   irbesartan  (AVAPRO ) 150 MG tablet [id number]  TAKE 1 TABLET BY MOUTH EVERY DAY Luking, Scott A, MD  Active   rosuvastatin  (CRESTOR ) 40 MG tablet [id number]  TAKE 1 TABLET  BY MOUTH EVERY DAY Luking, Scott A, MD  Active              Assessment/Plan:   Diabetes: - Currently uncontrolled; goal A1c <7%. Cardiorenal risk reduction is opportunities for improvement.. Blood pressure is at goal <130/80. LDL is at goal.  - Reviewed long term cardiovascular and renal outcomes of uncontrolled blood sugar. and Reviewed goal A1c, goal fasting, and goal 2 hour post prandial glucose. Recommended to check glucose fasting or if symptomatic - Would prefer to start GLP1 however program is limited for next year and Medicare patients.   -Patient to talk to [facility] Medicare counselor on the 24th to confirm insurance plans -Encouraged patient to monitor sugar since steroid injections -Discussed Januvia plan in detail; patient will reach out when she has determined her plan; She is in needed of knee surgery, but the surgeon states she must lose 20-25lbs prior to scheduling surgery.   Follow Up Plan: 1 month  Mliss Tarry Griffin, PharmD, BCACP, CPP Clinical Pharmacist, [facility]

## 2024-02-19 ENCOUNTER — Ambulatory Visit: Admitting: Nurse Practitioner

## 2024-02-19 VITALS — BP 122/64 | HR 82 | Temp 97.0°F | Ht 59.0 in | Wt 212.0 lb

## 2024-02-19 DIAGNOSIS — M7711 Lateral epicondylitis, right elbow: Secondary | ICD-10-CM | POA: Diagnosis not present

## 2024-02-19 DIAGNOSIS — M778 Other enthesopathies, not elsewhere classified: Secondary | ICD-10-CM | POA: Diagnosis not present

## 2024-02-19 NOTE — Progress Notes (Signed)
 "  Subjective:    Patient ID: [name], female    DOB: April 18, 1950, 73 y.o.   MRN: [id number]  HPI Nursing notes: PT started experiencing right arm pain about 1 month ago Mainly in forearm but can go up to the elbow PT is having trouble lifting her everyday household items  PT is experiencing numbness and aching in the forearm  Discussed the use of AI scribe software for clinical note transcription with the patient, who gave verbal consent to proceed.  History of Present Illness [name] is a 73 year old female who presents with right arm pain and weakness.  She has been experiencing right arm pain for over a month, initially starting in the wrist and progressing to the elbow, with occasional pain in the biceps area. The pain is exacerbated by movements such as turning or lifting objects, and she notes a feeling of weakness, particularly when lifting her coffee pot. No history of injury or repetitive motions is reported. She has tried ibuprofen and Tylenol without relief, and has not used ice, heat, or wrist braces. The pain does not radiate to the shoulder or neck, and there is no numbness or tingling in the fingers.  During the review of symptoms, she reports difficulty squeezing with her right hand compared to the left and experiences pain when twisting her wrist. No pain in the shoulder or neck, and there is no history of shoulder problems.         Objective:   Physical Exam NAD.  Alert, oriented.  Lungs clear.  Heart regular rate rhythm.  Can perform passive ROM of the shoulder without difficulty or producing pain in the right lower arm.  Mild tenderness noted around the right lateral epicondyle.  Minimal edema in the right forearm as compared to the left.  No erythema or warmth.  Strong radial pulse.  Fingers warm with normal capillary refill.  Hand strength 5+ left 3+ right.  Sensation grossly intact in the fingers.  Can perform full passive ROM of the right wrist with  mild tenderness noted.  Phalen and Tinel's negative.  Tenderness noted on the dorsal aspect of the right forearm with palpation. Today's Vitals   02/19/24 0919  BP: 122/64  Pulse: 82  Temp: (!) 97 F (36.1 C)  SpO2: 94%  Weight: 212 lb (96.2 kg)  Height: 4' 11 (1.499 m)   Body mass index is 42.82 kg/m.        Assessment & Plan:  1. Right wrist tendonitis (Primary) Right lateral epicondylitis with forearm and wrist tendinitis Chronic inflammation and strain likely from repetitive motion. No trauma history. Differential includes fracture, but unlikely. Good circulation and range of motion. No carpal tunnel syndrome. - Recommended Tylenol for pain management. - Prescribed topical anti-inflammatory gels such as Voltaren gel, Biofreeze, or lidocaine  gel. - Prescribed a wrist brace to stabilize the wrist and reduce tendinitis symptoms. - Advised to monitor symptoms and report if no improvement in 7-10 days for potential x-ray.  2. Lateral epicondylitis of right elbow Right lateral epicondylitis with forearm and wrist tendinitis Chronic inflammation and strain likely from repetitive motion. No trauma history. Differential includes fracture, but unlikely. Good circulation and range of motion. No carpal tunnel syndrome. - Recommended Tylenol for pain management. - Prescribed topical anti-inflammatory gels such as Voltaren gel, Biofreeze, or lidocaine  gel. - Prescribed a wrist brace to stabilize the wrist and reduce tendinitis symptoms. - Advised to monitor symptoms and report if no improvement in  7-10 days for potential x-ray.  Return for follow up with Dr. Glendia as planned.     "

## 2024-02-19 NOTE — Patient Instructions (Addendum)
 Voltaren gel Biofreeze Lidocaine  gel  Preferred GLP-1 medication

## 2024-02-20 ENCOUNTER — Encounter: Payer: Self-pay | Admitting: Nurse Practitioner

## 2024-02-26 ENCOUNTER — Telehealth: Payer: Self-pay | Admitting: Family Medicine

## 2024-02-26 NOTE — Telephone Encounter (Signed)
 Prescription Request  02/26/2024  LOV: 11/19/2023  What is the name of the medication or equipment? New rx request for new blood glucose monitor and supplies for the accu check guide  Have you contacted your pharmacy to request a refill? Yes   Which pharmacy would you like this sent to?  [id number] [place], [place] - [address] AT [facility] [address] [place]  [address] Phone: [phone] Fax: [phone]    Patient notified that their request is being sent to the clinical staff for review and that they should receive a response within 2 business days.   Please advise at faxed to [facility]

## 2024-02-29 ENCOUNTER — Other Ambulatory Visit: Payer: Self-pay

## 2024-02-29 ENCOUNTER — Ambulatory Visit: Payer: Medicare Other

## 2024-02-29 VITALS — BP 136/78 | Ht 59.0 in | Wt 210.0 lb

## 2024-02-29 DIAGNOSIS — Z0000 Encounter for general adult medical examination without abnormal findings: Secondary | ICD-10-CM | POA: Diagnosis not present

## 2024-02-29 MED ORDER — BLOOD GLUCOSE METER KIT: PACK | 0 refills | Status: DC

## 2024-02-29 NOTE — Patient Instructions (Addendum)
 [name],  Thank you for taking the time for your Medicare Wellness Visit. I appreciate your continued commitment to your health goals. Please review the care plan we discussed, and feel free to reach out if I can assist you further.  Please note that Annual Wellness Visits do not include a physical exam. Some assessments may be limited, especially if the visit was conducted virtually. If needed, we may recommend an in-person follow-up with your provider.  Ongoing Care Seeing your primary care provider every 3 to 6 months helps us  monitor your health and provide consistent, personalized care.   Referrals If a referral was made during today's visit and you haven't received any updates within two weeks, please contact the referred provider directly to check on the status.  Recommended Screenings:  Health Maintenance  Topic Date Due   DTaP/Tdap/Td vaccine (1 - Tdap) Never done   Colon Cancer Screening  07/12/2022   Complete foot exam   08/09/2023   Medicare Annual Wellness Visit  02/23/2024   Eye exam for diabetics  05/02/2024   Hemoglobin A1C  05/13/2024   COVID-19 Vaccine (7 - Moderna risk 2025-26 season) 05/18/2024   Yearly kidney function blood test for diabetes  11/13/2024   Yearly kidney health urinalysis for diabetes  11/13/2024   Breast Cancer Screening  12/05/2024   Pneumococcal Vaccine for age over 65  Completed   Flu Shot  Completed   Osteoporosis screening with Bone Density Scan  Completed   Hepatitis C Screening  Completed   Zoster (Shingles) Vaccine  Completed   Meningitis B Vaccine  Aged Out       02/23/2023    9:12 AM  Advanced Directives  Does Patient Have a Medical Advance Directive? No  Would patient like information on creating a medical advance directive? No - Patient declined   Information on Advanced Care Planning can be found at   Secretary of [facility] Advance Health Care Directives Advance Health Care Directives ([email or web address]    Vision:  Annual vision screenings are recommended for early detection of glaucoma, cataracts, and diabetic retinopathy. These exams can also reveal signs of chronic conditions such as diabetes and high blood pressure.  Dental: Annual dental screenings help detect early signs of oral cancer, gum disease, and other conditions linked to overall health, including heart disease and diabetes.  Please see the attached documents for additional preventive care recommendations.

## 2024-02-29 NOTE — Progress Notes (Signed)
 "  Chief Complaint  Patient presents with   Medicare Wellness     Subjective:   [name] is a 74 y.o. female who presents for a Medicare Annual Wellness Visit.  Visit info / Clinical Intake: Medicare Wellness Visit Type:: Subsequent Annual Wellness Visit Persons participating in visit and providing information:: patient Medicare Wellness Visit Mode:: In-person (required for WTM) Interpreter Needed?: No Pre-visit prep was completed: yes AWV questionnaire completed by patient prior to visit?: yes Date:: 02/25/24 Living arrangements:: (Patient-Rptd) lives with spouse/significant other Patient's Overall Health Status Rating: (Patient-Rptd) good Typical amount of pain: (Patient-Rptd) some Does pain affect daily life?: (!) (Patient-Rptd) yes Are you currently prescribed opioids?: no  Dietary Habits and Nutritional Risks How many meals a day?: (Patient-Rptd) 2 Eats fruit and vegetables daily?: (!) (Patient-Rptd) no Most meals are obtained by: (Patient-Rptd) preparing own meals In the last 2 weeks, have you had any of the following?: none Diabetic:: (!) yes Any non-healing wounds?: no How often do you check your BS?: 1 Would you like to be referred to a Nutritionist or for Diabetic Management? : no  Functional Status Activities of Daily Living (to include ambulation/medication): (Patient-Rptd) Independent Ambulation: (Patient-Rptd) Independent Medication Administration: (Patient-Rptd) Independent Home Management (perform basic housework or laundry): (Patient-Rptd) Independent Manage your own finances?: (Patient-Rptd) yes Primary transportation is: (Patient-Rptd) driving  Fall Screening Falls in the past year?: (Patient-Rptd) 0 Number of falls in past year: 0 Was there an injury with Fall?: 0 Fall Risk Category Calculator: 0 Patient Fall Risk Level: Low Fall Risk  Fall Risk Patient at Risk for Falls Due to: No Fall Risks Fall risk Follow up: Falls evaluation  completed; Education provided; Falls prevention discussed  Home and Transportation Safety: All rugs have non-skid backing?: (Patient-Rptd) yes All stairs or steps have railings?: (Patient-Rptd) yes Grab bars in the bathtub or shower?: (Patient-Rptd) yes Have non-skid surface in bathtub or shower?: (Patient-Rptd) yes Good home lighting?: (Patient-Rptd) yes Regular seat belt use?: (Patient-Rptd) yes Hospital stays in the last year:: (Patient-Rptd) no  Cognitive Assessment Difficulty concentrating, remembering, or making decisions? : no Will 6CIT or Mini Cog be Completed: no 6CIT or Mini Cog Declined: patient alert, oriented, able to answer questions appropriately and recall recent events  Advance Directives (For Healthcare) Does Patient Have a Medical Advance Directive?: No Would patient like information on creating a medical advance directive?: Yes (MAU/Ambulatory/Procedural Areas - Information given)  Reviewed/Updated  Reviewed/Updated: Reviewed All (Medical, Surgical, Family, Medications, Allergies, Care Teams, Patient Goals)    Allergies (verified) Farxiga  [dapagliflozin ] and Metformin  and related   Current Medications (verified) Outpatient Encounter Medications as of 02/29/2024  Medication Sig   amLODipine  (NORVASC ) 2.5 MG tablet TAKE 1 TABLET BY MOUTH EVERY DAY   aspirin EC 81 MG tablet Take 81 mg by mouth daily. Swallow whole.   glucose blood (ACCU-CHEK GUIDE) test strip TEST BLOOD SUGAR ONCE DAILY (E11.9)   irbesartan  (AVAPRO ) 150 MG tablet TAKE 1 TABLET BY MOUTH EVERY DAY   meloxicam  (MOBIC ) 7.5 MG tablet Take 7.5 mg by mouth daily as needed for pain.   rosuvastatin  (CRESTOR ) 40 MG tablet TAKE 1 TABLET BY MOUTH EVERY DAY   [DISCONTINUED] blood glucose meter kit and supplies Dispense based on patient and insurance preference. Use to check blood sugars once daily . (FOR ICD-10 E10.9, E11.9).   blood glucose meter kit and supplies Dispense based on patient and insurance  preference. Use to check blood sugars once daily . (FOR ICD-10 E10.9, E11.9).   [  DISCONTINUED] blood glucose meter kit and supplies Dispense based on patient and insurance preference. Use to check blood sugars once daily . (FOR ICD-10 E10.9, E11.9).   No facility-administered encounter medications on file as of 02/29/2024.    History: Past Medical History:  Diagnosis Date   Arthritis    Diabetes mellitus without complication (HCC)    Hyperlipidemia    Hypertension    Mild sleep apnea    Prediabetes    Prolonged QT interval    Renal disorder    Past Surgical History:  Procedure Laterality Date   ABDOMINAL HYSTERECTOMY     CHOLECYSTECTOMY     COLON SURGERY     Colonoscopy   COLONOSCOPY     COLONOSCOPY N/A 07/11/2017   Procedure: COLONOSCOPY;  Surgeon: Shaaron Lamar HERO, MD;  Location: [facility];  Service: Endoscopy;  Laterality: N/A;  8:30   EYE SURGERY     Cataracts   TUBAL LIGATION     Family History  Problem Relation Age of Onset   Heart disease Mother    Diabetes Mother    Arthritis Father    Cancer Father        prostate   Diabetes Maternal Grandmother    Arthritis Sister    Sleep apnea Neg Hx    Social History   Occupational History   Not on file  Tobacco Use   Smoking status: Former    Current packs/day: 0.00    Types: Cigarettes    Quit date: 06/17/1992    Years since quitting: 31.7   Smokeless tobacco: Never   Tobacco comments:    less than a pack a week  Vaping Use   Vaping status: Never Used  Substance and Sexual Activity   Alcohol use: Never   Drug use: Never   Sexual activity: Not Currently    Birth control/protection: None   Tobacco Counseling Counseling given: Not Answered Tobacco comments: less than a pack a week  SDOH Screenings   Food Insecurity: No Food Insecurity (02/29/2024)  Housing: Low Risk (02/29/2024)  Transportation Needs: No Transportation Needs (02/29/2024)  Utilities: Not At Risk (02/29/2024)  Alcohol Screen: Low Risk  (02/18/2024)  Depression (PHQ2-9): Low Risk (02/29/2024)  Financial Resource Strain: Low Risk (02/18/2024)  Physical Activity: Inactive (02/29/2024)  Social Connections: Socially Integrated (02/29/2024)  Stress: No Stress Concern Present (02/29/2024)  Tobacco Use: Medium Risk (02/29/2024)  Health Literacy: Adequate Health Literacy (02/29/2024)   See flowsheets for full screening details  Depression Screen PHQ 2 & 9 Depression Scale- Over the past 2 weeks, how often have you been bothered by any of the following problems? Little interest or pleasure in doing things: 1 Feeling down, depressed, or hopeless (PHQ Adolescent also includes...irritable): 0 PHQ-2 Total Score: 1 Trouble falling or staying asleep, or sleeping too much: 0 Feeling tired or having little energy: 1 Poor appetite or overeating (PHQ Adolescent also includes...weight loss): 1 Feeling bad about yourself - or that you are a failure or have let yourself or your family down: 0 Trouble concentrating on things, such as reading the newspaper or watching television (PHQ Adolescent also includes...like school work): 0 Moving or speaking so slowly that other people could have noticed. Or the opposite - being so fidgety or restless that you have been moving around a lot more than usual: 0 Thoughts that you would be better off dead, or of hurting yourself in some way: 0 PHQ-9 Total Score: 9 If you checked off any problems, how difficult have these  problems made it for you to do your work, take care of things at home, or get along with other people?: Not difficult at all     Goals Addressed             This Visit's Progress    Maintain health and independence   On track            Objective:    Today's Vitals   02/29/24 0848  BP: 136/78  Weight: 210 lb (95.3 kg)  Height: 4' 11 (1.499 m)   Body mass index is 42.41 kg/m.  Hearing/Vision screen Hearing Screening - Comments:: Patient is able to hear conversational tones  without difficulty. No issues reported. .    Vision Screening - Comments:: Wears rx glasses - up to date with routine eye exams with Dr. Darroll  Immunizations and Health Maintenance Health Maintenance  Topic Date Due   DTaP/Tdap/Td (1 - Tdap) Never done   Colonoscopy  07/12/2022   FOOT EXAM  08/09/2023   OPHTHALMOLOGY EXAM  05/02/2024   HEMOGLOBIN A1C  05/13/2024   COVID-19 Vaccine (7 - Moderna risk 2025-26 season) 05/18/2024   Diabetic kidney evaluation - eGFR measurement  11/13/2024   Diabetic kidney evaluation - Urine ACR  11/13/2024   Mammogram  12/05/2024   Medicare Annual Wellness (AWV)  02/28/2025   Pneumococcal Vaccine: 50+ Years  Completed   Influenza Vaccine  Completed   Bone Density Scan  Completed   Hepatitis C Screening  Completed   Zoster Vaccines- Shingrix  Completed   Meningococcal B Vaccine  Aged Out        Assessment/Plan:  This is a routine wellness examination for Arynn.  Patient Care Team: Alphonsa Glendia LABOR, MD as PCP - General (Family Medicine) Mallipeddi, Diannah SQUIBB, MD as PCP - Cardiology (Cardiology) Shaaron Lamar HERO, MD as Consulting Physician (Gastroenterology) Johnson Laymon HERO, PA-C as Physician Assistant (Cardiology) Duwayne Purchase, MD as Consulting Physician (Orthopedic Surgery) Darroll Anes, DO (Optometry)  I have personally reviewed and noted the following in the patients chart:   Medical and social history Use of alcohol, tobacco or illicit drugs  Current medications and supplements including opioid prescriptions. Functional ability and status Nutritional status Physical activity Advanced directives List of other physicians Hospitalizations, surgeries, and ER visits in previous 12 months Vitals Screenings to include cognitive, depression, and falls Referrals and appointments  No orders of the defined types were placed in this encounter.  In addition, I have reviewed and discussed with patient certain preventive protocols,  quality metrics, and best practice recommendations. A written personalized care plan for preventive services as well as general preventive health recommendations were provided to patient.   Lavelle Charmaine Browner, LPN   10/04/7971   Return in 1 year (on 02/28/2025).  After Visit Summary: (In Person-Printed) AVS printed and given to the patient  Nurse Notes: No voiced or noted concerns at this time  "

## 2024-03-01 ENCOUNTER — Other Ambulatory Visit: Payer: Self-pay | Admitting: Nurse Practitioner

## 2024-03-01 DIAGNOSIS — E0822 Diabetes mellitus due to underlying condition with diabetic chronic kidney disease: Secondary | ICD-10-CM

## 2024-03-01 MED ORDER — BLOOD GLUCOSE METER KIT: PACK | 0 refills | Status: AC

## 2024-03-01 NOTE — Telephone Encounter (Signed)
 Please send in order for new glucometer strips and supplies patient has type 2 diabetes should qualify to check her sugar once daily as needed thank you may have 1 years worth of strips

## 2024-03-05 ENCOUNTER — Other Ambulatory Visit: Payer: Self-pay

## 2024-03-05 DIAGNOSIS — E0822 Diabetes mellitus due to underlying condition with diabetic chronic kidney disease: Secondary | ICD-10-CM

## 2024-03-05 MED ORDER — LANCET DEVICE MISC: 1.0000 | Freq: Every day | 0 refills | Status: AC

## 2024-03-05 MED ORDER — LANCETS MISC: 1.0000 | 1 refills | Status: DC

## 2024-03-05 MED ORDER — BLOOD GLUCOSE TEST VI STRP: 1.0000 | ORAL_STRIP | Freq: Every day | 2 refills | Status: AC

## 2024-03-05 MED ORDER — BLOOD GLUCOSE MONITORING SUPPL DEVI: 1.0000 | Freq: Every day | 1 refills | Status: AC

## 2024-03-06 ENCOUNTER — Other Ambulatory Visit: Payer: Self-pay | Admitting: Family Medicine

## 2024-03-06 DIAGNOSIS — I10 Essential (primary) hypertension: Secondary | ICD-10-CM

## 2024-03-08 ENCOUNTER — Other Ambulatory Visit: Payer: Self-pay | Admitting: Family Medicine

## 2024-03-08 DIAGNOSIS — N1831 Chronic kidney disease, stage 3a: Secondary | ICD-10-CM

## 2024-03-10 ENCOUNTER — Other Ambulatory Visit: Payer: Self-pay | Admitting: Family Medicine

## 2024-03-10 DIAGNOSIS — N1831 Chronic kidney disease, stage 3a: Secondary | ICD-10-CM

## 2024-05-19 ENCOUNTER — Ambulatory Visit: Admitting: Family Medicine

## 2024-05-21 ENCOUNTER — Ambulatory Visit: Admitting: Family Medicine
